# Patient Record
Sex: Male | Born: 1945
Health system: Southern US, Community
[De-identification: ages and names within clinical notes are randomized; demographics above are authoritative.]

## PROBLEM LIST (undated history)

## (undated) DIAGNOSIS — H269 Unspecified cataract: Secondary | ICD-10-CM

## (undated) DIAGNOSIS — H3530 Unspecified macular degeneration: Secondary | ICD-10-CM

## (undated) DIAGNOSIS — E785 Hyperlipidemia, unspecified: Secondary | ICD-10-CM

## (undated) DIAGNOSIS — I10 Essential (primary) hypertension: Secondary | ICD-10-CM

## (undated) DIAGNOSIS — U071 COVID-19: Secondary | ICD-10-CM

## (undated) DIAGNOSIS — N261 Atrophy of kidney (terminal): Secondary | ICD-10-CM

## (undated) DIAGNOSIS — B019 Varicella without complication: Secondary | ICD-10-CM

## (undated) DIAGNOSIS — IMO0002 Reserved for concepts with insufficient information to code with codable children: Secondary | ICD-10-CM

## (undated) DIAGNOSIS — Z87442 Personal history of urinary calculi: Secondary | ICD-10-CM

## (undated) DIAGNOSIS — Z950 Presence of cardiac pacemaker: Secondary | ICD-10-CM

## (undated) DIAGNOSIS — J439 Emphysema, unspecified: Secondary | ICD-10-CM

## (undated) DIAGNOSIS — K753 Granulomatous hepatitis, not elsewhere classified: Secondary | ICD-10-CM

## (undated) DIAGNOSIS — K219 Gastro-esophageal reflux disease without esophagitis: Secondary | ICD-10-CM

## (undated) DIAGNOSIS — M51369 Other intervertebral disc degeneration, lumbar region without mention of lumbar back pain or lower extremity pain: Secondary | ICD-10-CM

## (undated) DIAGNOSIS — M5136 Other intervertebral disc degeneration, lumbar region: Secondary | ICD-10-CM

## (undated) DIAGNOSIS — N200 Calculus of kidney: Secondary | ICD-10-CM

## (undated) DIAGNOSIS — Z8719 Personal history of other diseases of the digestive system: Secondary | ICD-10-CM

## (undated) DIAGNOSIS — K8020 Calculus of gallbladder without cholecystitis without obstruction: Secondary | ICD-10-CM

## (undated) DIAGNOSIS — K449 Diaphragmatic hernia without obstruction or gangrene: Secondary | ICD-10-CM

## (undated) DIAGNOSIS — Q600 Renal agenesis, unilateral: Secondary | ICD-10-CM

## (undated) DIAGNOSIS — I517 Cardiomegaly: Secondary | ICD-10-CM

## (undated) DIAGNOSIS — Z95810 Presence of automatic (implantable) cardiac defibrillator: Secondary | ICD-10-CM

## (undated) DIAGNOSIS — Z45018 Encounter for adjustment and management of other part of cardiac pacemaker: Secondary | ICD-10-CM

## (undated) HISTORY — PX: FOOT SURGERY: SHX648

## (undated) HISTORY — DX: Other intervertebral disc degeneration, lumbar region without mention of lumbar back pain or lower extremity pain: M51.369

## (undated) HISTORY — DX: Calculus of kidney: N20.0

## (undated) HISTORY — DX: Atrophy of kidney (terminal): N26.1

## (undated) HISTORY — DX: Other intervertebral disc degeneration, lumbar region: M51.36

## (undated) HISTORY — DX: Varicella without complication: B01.9

## (undated) HISTORY — DX: Unspecified cataract: H26.9

## (undated) HISTORY — DX: Hyperlipidemia, unspecified: E78.5

## (undated) HISTORY — DX: COVID-19: U07.1

## (undated) HISTORY — DX: Diaphragmatic hernia without obstruction or gangrene: K44.9

## (undated) HISTORY — DX: Cardiomegaly: I51.7

## (undated) HISTORY — DX: Essential (primary) hypertension: I10

## (undated) HISTORY — DX: Granulomatous hepatitis, not elsewhere classified: K75.3

## (undated) HISTORY — DX: Personal history of other diseases of the digestive system: Z87.19

## (undated) HISTORY — PX: PACEMAKER PLACEMENT: SHX43

## (undated) HISTORY — DX: Unspecified macular degeneration: H35.30

## (undated) HISTORY — DX: Emphysema, unspecified: J43.9

## (undated) HISTORY — DX: Calculus of gallbladder without cholecystitis without obstruction: K80.20

---
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
## 1999-11-01 ENCOUNTER — Encounter: Payer: Self-pay | Admitting: Gastroenterology

## 1999-11-01 ENCOUNTER — Encounter (INDEPENDENT_AMBULATORY_CARE_PROVIDER_SITE_OTHER): Payer: Self-pay

## 1999-11-01 ENCOUNTER — Ambulatory Visit (HOSPITAL_COMMUNITY): Admission: RE | Admit: 1999-11-01 | Discharge: 1999-11-01 | Payer: Self-pay | Admitting: Gastroenterology

## 2000-04-13 ENCOUNTER — Encounter: Admission: RE | Admit: 2000-04-13 | Discharge: 2000-04-13 | Payer: Self-pay | Admitting: Gastroenterology

## 2000-04-13 ENCOUNTER — Encounter: Payer: Self-pay | Admitting: Gastroenterology

## 2001-10-10 ENCOUNTER — Ambulatory Visit (HOSPITAL_COMMUNITY): Admission: RE | Admit: 2001-10-10 | Discharge: 2001-10-10 | Payer: Self-pay | Admitting: Orthopedic Surgery

## 2001-10-10 ENCOUNTER — Encounter (INDEPENDENT_AMBULATORY_CARE_PROVIDER_SITE_OTHER): Payer: Self-pay | Admitting: *Deleted

## 2007-01-28 ENCOUNTER — Emergency Department (HOSPITAL_COMMUNITY): Admission: EM | Admit: 2007-01-28 | Discharge: 2007-01-28 | Payer: Self-pay | Admitting: Emergency Medicine

## 2007-01-29 ENCOUNTER — Ambulatory Visit (HOSPITAL_BASED_OUTPATIENT_CLINIC_OR_DEPARTMENT_OTHER): Admission: RE | Admit: 2007-01-29 | Discharge: 2007-01-29 | Payer: Self-pay | Admitting: Urology

## 2009-05-08 ENCOUNTER — Encounter: Admission: RE | Admit: 2009-05-08 | Discharge: 2009-05-08 | Payer: Self-pay | Admitting: Family Medicine

## 2011-04-01 NOTE — Op Note (Signed)
[name], [name]                 ACCOUNT NO.:  [id number]   MEDICAL RECORD NO.:  [id number]          PATIENT TYPE:  AMB   LOCATION:  [facility]                         FACILITY:  [facility]   PHYSICIAN:  Maretta Bees. Vonita Moss, M.D.DATE OF BIRTH:  [birth year]   DATE OF PROCEDURE:  01/29/2007  DATE OF DISCHARGE:                               OPERATIVE REPORT   PREOPERATIVE DIAGNOSES:  Right ureteral stone with hydronephrosis, acute  renal insufficiency, atrophic left kidney with duplicated system and  hydronephrotic lower pole.   POSTOPERATIVE DIAGNOSES:  Right ureteral stone with hydronephrosis,  acute renal insufficiency, atrophic left kidney with duplicated system  and hydronephrotic lower pole.   PROCEDURE:  Cystoscopy, bilateral retrograde pyelograms with  interpretation, right ureteroscopy, holmium laser of stone and insertion  of right double-J catheter.   INDICATIONS:  This gentleman has had eight or nine days of right flank  pain and nausea, and was seen by me today on an emergent basis.  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  He had compensatory  hypertrophy of the right kidney with hydronephrosis down to a 4 mm stone  at the iliac crest.  The creatinine today was 4.4.   I had a detailed discussion about management of his problem with the  patient, and actually the discussion was really before the creatinine of  4.4 was resulted.  I told him that I thought with his solitary good  kidney he should undergo a cystoscopy and ureteroscopy but not  aggressively so, and we would try and get the stone out today, but he  would probably need a double-J catheter, and certainly with the  creatinine of 4.4, that became more apparent   DESCRIPTION OF PROCEDURE:  The  patient was brought to the operating room  and placed in the lithotomy position.  The external genitalia were  prepped and draped in the usual fashion.  He was cystoscoped, and the  anterior and  prostatic urethra were unremarkable.  The bladder was  unremarkable.   Retrograde pyelograms were performed by placement of ureteral catheters  through the cystoscope, and initially an open-ended ureteral catheter  was used to perform a left retrograde pyelogram that showed a dilated  lower pole collecting system and a delicate upper pole collecting  system.  No filling defects were noted.  In the course of performing his  right ureteroscopy, contrast showed mild pyelocaliectasis, and at the  end of the case a good placement of the proximal end of his double-J  catheter.   The guidewire was placed up the right ureter without particular  difficulty.  It was really hard to visualize the stone for sure.  It  might have been over the bony iliac crest.  With the guidewire in place,  the ureteral orifice looked a little too snug to insert a ureteroscope,  so I  used the internal sheath of the ureteral access sheath to dilate  the intra-mural ureter.  After that, I negotiated the 6-French rigid  ureteroscope, up to the level of the iliac crest where the ureter was a  little bit narrowed or in spasm.  I then inserted the internal sheath of  the access sheath up to this level, and after that the ureteroscope did  go beyond this area and I encountered his stone.  I was able to grasp  the stone, in a Nitinol stone basket but it would not come down through  this tight area and I did not want to force the issue in an  essentially  solitary good kidney.  I released the stone and then inserted the  ureteroscope again and using the laser, I hit it with a couple of  shocks, but it wound rebound back up the ureter.  I was able to retrieve  it again with the basket and again had to release it, because it would   not come down beyond the ureter at the level of the iliac vessels,  I  used the laser again, but again  I could not immobilize the stone.  I  did not a risk,  keeping the stone and a basket and cutting the basket  handle off and then having foreign bodies in his solitary good renal  unit.  I concluded that it was best just to put up a double-J at this  point and reassess his situation in a couple days and repeat a BUN and  creatinine and repeat some x-rays and possibly a repeat ureteroscopy,  which would allow [place] to retrieve the stone more readily.  It  may be  that a multi-thin flexible ureteroscope and further ureteral dilation  might be necessary to retrieve this stone, if that is what we decide to  do.  In any case, I did insert a 6-French, 28 cm double-J catheter.  This coiled nicely in the renal pelvis and a full coil in the bladder.  The string was removed.  The bladder was emptied.  The cystoscope was  removed.   The patient was sent to he recovery room in good condition, having  tolerated the procedure well.      Maretta Bees. Vonita Moss, M.D.  Electronically Signed     LJP/MEDQ  D:  01/29/2007  T:  01/30/2007  Job:  [id number]

## 2011-04-01 NOTE — Op Note (Signed)
[facility]  Patient:    [name], [name] Visit Number: [id number] MRN: [id number]          Service Type: DSU Location: DAY Attending Physician:  Marlowe Kays Page Dictated by:   Illene Labrador. Aplington, M.D. Proc. 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  OPERATION PERFORMED:  Excision of Mortons neuroma third and fourth web space, right foot.  SURGEON:  Illene Labrador. Aplington, M.D.  ASSISTANT:  Nurse.  ANESTHESIA:  General.  PATHOLOGY AND JUSTIFICATION FOR PROCEDURE:  He had typical signs and symptoms of Mortons neuroma. He has a slight tenderness in the second and third web space but most of his pain is reproduced in the third and fourth. I discussed with him that due to risk of possibly devascularizing the third toe, it was best not to operate on both at the same time and that he might not need the second and third web space surgery. He had had only temporary relief from steroid and xylocaine injection.  DESCRIPTION OF PROCEDURE:  Satisfactory general anesthesia, pneumatic tourniquet, Duraprep, the foot and ankle was draped in a sterile field. Through a web splitting incision with blunt dissection, I isolated the very scarred common digital nerve which I grasped with Allis clamp. I then dissected out the two branches to the third and fourth toes which I cut distally with the cutting cautery. I also freed up a good bit of the scar from around the common digital nerve with both the Bovie and sharp dissection with scissors. The stalk, I then sectioned as far between the intermetatarsal head area as I could with the cutting cautery. I then released the tourniquet. One bleeder was found and coagulated. The wound was dry thereafter. I  irrigated the wound with sterile saline, placed some Gelfoam between the metatarsal heads at the base of the surgical excision area. I placed a single 3-0 Vicryl subcutaneous tissue which brought the wound edges together nicely. I then closed the skin and subcutaneous tissue with a combination of simple and interrupted 4-0 nylon. Betadine Adaptic dry sterile dressing were then applied. The patient tolerated the procedure well and at the time of this dictation was on his way to the recovery room in satisfactory condition with no known complications. Dictated by:   Illene Labrador. Aplington, M.D. Attending Physician:  Joaquin Courts DD:  10/10/01 TD:  10/10/01 Job: [id number]

## 2013-01-28 ENCOUNTER — Other Ambulatory Visit: Payer: Self-pay | Admitting: Family Medicine

## 2013-01-28 DIAGNOSIS — Z131 Encounter for screening for diabetes mellitus: Secondary | ICD-10-CM | POA: Diagnosis not present

## 2013-01-28 DIAGNOSIS — Z136 Encounter for screening for cardiovascular disorders: Secondary | ICD-10-CM

## 2013-01-28 DIAGNOSIS — Z23 Encounter for immunization: Secondary | ICD-10-CM | POA: Diagnosis not present

## 2013-01-28 DIAGNOSIS — R030 Elevated blood-pressure reading, without diagnosis of hypertension: Secondary | ICD-10-CM | POA: Diagnosis not present

## 2013-01-28 DIAGNOSIS — Z0000 Encounter for general adult medical examination without abnormal findings: Secondary | ICD-10-CM | POA: Diagnosis not present

## 2013-01-28 DIAGNOSIS — E7800 Pure hypercholesterolemia, unspecified: Secondary | ICD-10-CM | POA: Diagnosis not present

## 2013-02-04 ENCOUNTER — Ambulatory Visit
Admission: RE | Admit: 2013-02-04 | Discharge: 2013-02-04 | Disposition: A | Discharge status: Home / Self Care | Payer: Medicare Other | Source: Ambulatory Visit | Attending: Family Medicine | Admitting: Family Medicine

## 2013-02-04 DIAGNOSIS — Z136 Encounter for screening for cardiovascular disorders: Secondary | ICD-10-CM

## 2014-02-04 ENCOUNTER — Telehealth: Payer: Self-pay | Admitting: *Deleted

## 2014-02-04 ENCOUNTER — Other Ambulatory Visit: Payer: Self-pay | Admitting: Internal Medicine

## 2014-02-04 DIAGNOSIS — I442 Atrioventricular block, complete: Secondary | ICD-10-CM

## 2014-02-04 DIAGNOSIS — Z136 Encounter for screening for cardiovascular disorders: Secondary | ICD-10-CM | POA: Diagnosis not present

## 2014-02-04 DIAGNOSIS — Q605 Renal hypoplasia, unspecified: Secondary | ICD-10-CM | POA: Diagnosis not present

## 2014-02-04 DIAGNOSIS — Z1211 Encounter for screening for malignant neoplasm of colon: Secondary | ICD-10-CM | POA: Diagnosis not present

## 2014-02-04 DIAGNOSIS — I498 Other specified cardiac arrhythmias: Secondary | ICD-10-CM | POA: Diagnosis not present

## 2014-02-04 DIAGNOSIS — I10 Essential (primary) hypertension: Secondary | ICD-10-CM | POA: Diagnosis not present

## 2014-02-04 DIAGNOSIS — Z0000 Encounter for general adult medical examination without abnormal findings: Secondary | ICD-10-CM | POA: Diagnosis not present

## 2014-02-04 DIAGNOSIS — Q602 Renal agenesis, unspecified: Secondary | ICD-10-CM | POA: Diagnosis not present

## 2014-02-04 NOTE — Telephone Encounter (Signed)
Pt referred for evaluation of PPM implantation due to heart block found today at office visit with Dr Einar Gip.  Dr Rayann Heman reviewed EKG and recommended evaluation in short stay tomorrow with pacemaker implantation same day.  Spoke with patient and advised to come to short stay at [facility] for EP consult.  Echocardiogram and pre-pacemaker orders written.  Pt advised if any symptoms of dizziness, chest pain, or pre-syncope he should come to the ER for evaluation.  Also advised he should not drive.  Pt aware and agrees with plan.

## 2014-02-05 ENCOUNTER — Encounter (HOSPITAL_COMMUNITY): Payer: Self-pay | Admitting: Cardiology

## 2014-02-05 ENCOUNTER — Inpatient Hospital Stay (HOSPITAL_COMMUNITY)
Admission: RE | Admit: 2014-02-05 | Discharge: 2014-02-07 | DRG: 243 | Disposition: A | Discharge status: Home / Self Care | Payer: Medicare Other | Source: Ambulatory Visit | Attending: Internal Medicine | Admitting: Internal Medicine

## 2014-02-05 ENCOUNTER — Encounter (HOSPITAL_COMMUNITY)
Admission: RE | Disposition: A | Discharge status: Home / Self Care | Payer: Self-pay | Source: Ambulatory Visit | Attending: Internal Medicine

## 2014-02-05 DIAGNOSIS — Q605 Renal hypoplasia, unspecified: Secondary | ICD-10-CM

## 2014-02-05 DIAGNOSIS — I442 Atrioventricular block, complete: Secondary | ICD-10-CM | POA: Diagnosis not present

## 2014-02-05 DIAGNOSIS — I369 Nonrheumatic tricuspid valve disorder, unspecified: Secondary | ICD-10-CM | POA: Diagnosis not present

## 2014-02-05 DIAGNOSIS — I498 Other specified cardiac arrhythmias: Secondary | ICD-10-CM | POA: Diagnosis not present

## 2014-02-05 DIAGNOSIS — Q600 Renal agenesis, unilateral: Secondary | ICD-10-CM

## 2014-02-05 DIAGNOSIS — E785 Hyperlipidemia, unspecified: Secondary | ICD-10-CM | POA: Diagnosis not present

## 2014-02-05 DIAGNOSIS — K219 Gastro-esophageal reflux disease without esophagitis: Secondary | ICD-10-CM | POA: Diagnosis present

## 2014-02-05 DIAGNOSIS — J9819 Other pulmonary collapse: Secondary | ICD-10-CM | POA: Diagnosis not present

## 2014-02-05 DIAGNOSIS — Q602 Renal agenesis, unspecified: Secondary | ICD-10-CM

## 2014-02-05 DIAGNOSIS — IMO0002 Reserved for concepts with insufficient information to code with codable children: Secondary | ICD-10-CM

## 2014-02-05 HISTORY — DX: Hyperlipidemia, unspecified: E78.5

## 2014-02-05 HISTORY — DX: Gastro-esophageal reflux disease without esophagitis: K21.9

## 2014-02-05 HISTORY — DX: Reserved for concepts with insufficient information to code with codable children: IMO0002

## 2014-02-05 HISTORY — DX: Renal agenesis, unilateral: Q60.0

## 2014-02-05 HISTORY — PX: PERMANENT PACEMAKER INSERTION: SHX5480

## 2014-02-05 LAB — BASIC METABOLIC PANEL
BUN: 14 mg/dL (ref 6–23)
CHLORIDE: 105 meq/L (ref 96–112)
CO2: 23 mEq/L (ref 19–32)
CREATININE: 0.95 mg/dL (ref 0.50–1.35)
Calcium: 9.2 mg/dL (ref 8.4–10.5)
GFR calc Af Amer: >90 mL/min (ref >90)
GFR calc non Af Amer: 85 mL/min — ABNORMAL LOW (ref >90)
GLUCOSE: 98 mg/dL (ref 70–99)
Potassium: 4.4 mEq/L (ref 3.7–5.3)
Sodium: 142 mEq/L (ref 137–147)

## 2014-02-05 LAB — CBC
HEMATOCRIT: 42.9 % (ref 39.0–52.0)
Hemoglobin: 14.4 g/dL (ref 13.0–17.0)
MCH: 27.9 pg (ref 26.0–34.0)
MCHC: 33.6 g/dL (ref 30.0–36.0)
MCV: 83 fL (ref 78.0–100.0)
Platelets: 162 10*3/uL (ref 150–400)
RBC: 5.17 MIL/uL (ref 4.22–5.81)
RDW: 14.5 % (ref 11.5–15.5)
WBC: 4.8 10*3/uL (ref 4.0–10.5)

## 2014-02-05 LAB — TSH: TSH: 0.819 u[IU]/mL (ref 0.350–4.500)

## 2014-02-05 LAB — SURGICAL PCR SCREEN
MRSA, PCR: NEGATIVE
Staphylococcus aureus: POSITIVE — AB

## 2014-02-05 LAB — PROTIME-INR
INR: 1.04 (ref 0.00–1.49)
Prothrombin Time: 13.4 seconds (ref 11.6–15.2)

## 2014-02-05 SURGERY — PERMANENT PACEMAKER INSERTION
Anesthesia: LOCAL

## 2014-02-05 MED ORDER — SODIUM CHLORIDE 0.9 % IV SOLN: INTRAVENOUS | Status: AC

## 2014-02-05 MED ORDER — CEFAZOLIN SODIUM-DEXTROSE 2-3 GM-% IV SOLR
2.0000 g | INTRAVENOUS | Status: DC
  Filled 2014-02-05: qty 50

## 2014-02-05 MED ORDER — ONDANSETRON HCL 4 MG/2ML IJ SOLN: 4.0000 mg | Freq: Four times a day (QID) | INTRAMUSCULAR | Status: DC | PRN

## 2014-02-05 MED ORDER — PERFLUTREN LIPID MICROSPHERE
1.0000 mL | INTRAVENOUS | Status: AC | PRN
  Administered 2014-02-05: 2 mL via INTRAVENOUS
  Filled 2014-02-05: qty 10

## 2014-02-05 MED ORDER — GENTAMICIN SULFATE 40 MG/ML IJ SOLN
80.0000 mg | INTRAMUSCULAR | Status: DC
  Filled 2014-02-05: qty 2

## 2014-02-05 MED ORDER — CHLORHEXIDINE GLUCONATE 4 % EX LIQD: 60.0000 mL | Freq: Once | CUTANEOUS | Status: DC

## 2014-02-05 MED ORDER — LIDOCAINE HCL (PF) 1 % IJ SOLN
INTRAMUSCULAR | Status: AC
  Filled 2014-02-05: qty 60

## 2014-02-05 MED ORDER — MUPIROCIN 2 % EX OINT
TOPICAL_OINTMENT | Freq: Two times a day (BID) | CUTANEOUS | Status: DC
  Administered 2014-02-05 (×2): 1 via NASAL
  Administered 2014-02-06 (×2): via NASAL

## 2014-02-05 MED ORDER — FENTANYL CITRATE 0.05 MG/ML IJ SOLN
INTRAMUSCULAR | Status: AC
  Filled 2014-02-05: qty 2

## 2014-02-05 MED ORDER — HEPARIN (PORCINE) IN NACL 2-0.9 UNIT/ML-% IJ SOLN
INTRAMUSCULAR | Status: AC
  Filled 2014-02-05: qty 500

## 2014-02-05 MED ORDER — CEFAZOLIN SODIUM 1-5 GM-% IV SOLN
1.0000 g | Freq: Four times a day (QID) | INTRAVENOUS | Status: AC
  Administered 2014-02-05 – 2014-02-06 (×3): 1 g via INTRAVENOUS
  Filled 2014-02-05 (×3): qty 50

## 2014-02-05 MED ORDER — ACETAMINOPHEN 325 MG PO TABS: 325.0000 mg | ORAL_TABLET | ORAL | Status: DC | PRN

## 2014-02-05 MED ORDER — SODIUM CHLORIDE 0.9 % IV SOLN
INTRAVENOUS | Status: DC
  Administered 2014-02-05: 11:00:00 via INTRAVENOUS

## 2014-02-05 MED ORDER — MIDAZOLAM HCL 5 MG/5ML IJ SOLN
INTRAMUSCULAR | Status: AC
  Filled 2014-02-05: qty 5

## 2014-02-05 MED ORDER — MUPIROCIN 2 % EX OINT
TOPICAL_OINTMENT | CUTANEOUS | Status: AC
  Administered 2014-02-05: 1
  Filled 2014-02-05: qty 22

## 2014-02-05 MED ORDER — SODIUM CHLORIDE 0.9 % IV SOLN
INTRAVENOUS | Status: DC
  Administered 2014-02-05: 20:00:00 via INTRAVENOUS

## 2014-02-05 NOTE — H&P (Signed)
ELECTROPHYSIOLOGY ADMISSION HISTORY & PHYSICAL   Patient ID: [name] MRN: [id number], DOB/AGE: 1946-02-14   Date of Admission: 02/05/2014  Primary Physician: Maxwell Marion, MD Primary Cardiologist: Einar Gip, MD Reason for Admission: Complete heart block  History of Present Illness [name] is a pleasant 68 year old man with GERD, dyslipidemia and solitary kidney who presents with complete heart block. He reports "not feeling like myself" for about 6 months and states he has noticed increasing fatigue / exercise intolerance and DOE. He denies CP, palpitations, dizziness, near syncope or syncope. He denies LE swelling, orthopnea or PND. He denies history of CAD/MI, valvular heart disease or CHF. He takes no medications. He was evaluated by his PCP yesterday for routine physical and was bradycardic. An ECG was done revealing complete heart block and he was referred urgently to Dr. Einar Gip who then referred him for EP evaluation today. Of note, [name] denies history of rheumatologic disorder nor does he have history of sarcoidosis or amyloidosis. He has history of tick bite (unknown species) but greater than one year ago and no symptoms / complications following the bite.   Past Medical History Past Medical History  Diagnosis Date  . Solitary kidney   . GERD (gastroesophageal reflux disease)   . Dyslipidemia     Past Surgical History None   Allergies/Intolerances No known allergies  Home Medications None  Family History Negative for CAD or SCD   Social History History   Social History  . Marital Status: Single    Spouse Name: N/A    Number of Children: N/A  . Years of Education: N/A   Occupational History  . Not on file.   Social History Main Topics  . Smoking status: Never Smoker   . Smokeless tobacco: Not on file  . Alcohol Use: 0.0 oz/week     Comment: Drinks 2 ounces of liquor daily  . Drug Use: No  . Sexual Activity: Not on file   Other Topics Concern  . Not on  file   Social History Narrative  . No narrative on file     Review of Systems General: No chills, fever, night sweats or weight changes.  Cardiovascular: No chest pain, dyspnea on exertion, edema, orthopnea, palpitations, paroxysmal nocturnal dyspnea. Dermatological: No rash, lesions or masses. Respiratory: No cough, dyspnea. Urologic: No hematuria, dysuria. Abdominal: No nausea, vomiting, diarrhea, bright red blood per rectum, melena, or hematemesis. Neurologic: No visual changes, weakness, changes in mental status. All other systems reviewed and are otherwise negative except as noted above.  Physical Exam Vitals: Blood pressure 151/82, pulse 36, temperature 98.1 F (36.7 C), temperature source Oral, resp. rate 18, height 6\' 1"  (1.854 m), weight 220 lb (99.791 kg), SpO2 100.00%.  General: Well developed, well appearing 68 y.o. male in no acute distress. HEENT: Normocephalic, atraumatic. EOMs intact. Sclera nonicteric. Oropharynx clear.  Neck: Supple without bruits. No JVD. Lungs: Respirations regular and unlabored, CTA bilaterally. No wheezes, rales or rhonchi. Heart: Bradycardic. S1, S2 present. No murmurs, rub, S3 or S4. Abdomen: Soft, non-tender, non-distended. BS present x 4 quadrants. No hepatosplenomegaly.  Extremities: No clubbing, cyanosis or edema. DP/PT/Radials 2+ and equal bilaterally. Psych: Normal affect. Neuro: Alert and oriented X 3. Moves all extremities spontaneously. Musculoskeletal: No kyphosis. Skin: Intact. Warm and dry. No rashes or petechiae in exposed areas.   Labs CBC    Component Value Date/Time   WBC 4.8 02/05/2014 0900   RBC 5.17 02/05/2014 0900   HGB 14.4 02/05/2014 0900  HCT 42.9 02/05/2014 0900   PLT 162 02/05/2014 0900   MCV 83.0 02/05/2014 0900   MCH 27.9 02/05/2014 0900   MCHC 33.6 02/05/2014 0900   RDW 14.5 02/05/2014 0900   BMET    Component Value Date/Time   NA 142 02/05/2014 0900   K 4.4 02/05/2014 0900   CL 105 02/05/2014 0900   CO2 23  02/05/2014 0900   GLUCOSE 98 02/05/2014 0900   BUN 14 02/05/2014 0900   CREATININE 0.95 02/05/2014 0900   CALCIUM 9.2 02/05/2014 0900   GFRNONAA 85* 02/05/2014 0900   GFRAA >90 02/05/2014 0900   TSH pending  Radiology/Studies No results found.  Echocardiogram today  Study Conclusions - Left ventricle: The cavity size was normal. Wall thickness was increased in a pattern of mild LVH. Systolic function was normal. The estimated ejection fraction was in the range of 55% to 60%. Wall motion was normal; there were no regional wall motion abnormalities. Left ventricular diastolic function parameters were normal. - Left atrium: The atrium was at the upper limits of normal in size. - Tricuspid valve: Mild regurgitation. - Systemic veins: IVC is not well visualized. - Pericardium, extracardiac: There was no pericardial effusion.  12-lead ECG - complete heart block with ventricular escape at 36 bpm  There are no prior 12-lead ECGs available for comparison  Assessment and Plan Complete heart block Normal LVEF [name] presents with symptomatic complete heart block. There are no reversible causes identified. He meets criteria for PPM implantation. Risks, benefits and alternatives to PPM implantation were discussed in detail with him and his family today. These risks include, but are not limited to, bleeding, infection, pneumothorax, perforation, tamponade, vascular damage, lead dislodgement, renal failure, MI, stroke and death. [name] expressed verbal understanding and agrees to proceed.   Signed, Ileene Hutchinson, PA-C 02/05/2014, 11:09 AM  AS above  Pt with progressive DOE  Without LH  And noted yesterday to have CHB  With underlying normal LV function  PE as noted  The benefits and risks were reviewed including but not limited to death,  perforation, infection, lead dislodgement and device malfunction.  The patient understands agrees and is willing to proceed.

## 2014-02-05 NOTE — Progress Notes (Deleted)
Report called to Olivia, RN on 2W. 

## 2014-02-05 NOTE — Progress Notes (Signed)
Echocardiogram 2D Echocardiogram with Definity has been performed.  [name] 02/05/2014, 10:41 AM

## 2014-02-05 NOTE — Progress Notes (Signed)
Utilization Review Completed.[name] T3/25/2015  

## 2014-02-05 NOTE — CV Procedure (Signed)
Preop DX:: Post op DX:: same  Procedure  dual pacemaker implantation  After routine prep and drape, lidocaine was infiltrated in the prepectoral subclavicular region on the left side an incision was made and carried down to later the prepectoral fascia using electrocautery and sharp dissection a pocket was formed similarly. Hemostasis was obtained.  After this, we turned our attention to gaining accessm to the extrathoracic,left subclavian vein. This was accomplished without difficulty and without the aspiration of air or puncture of the artery. 2 separate venipunctures were accomplished; guidewires were placed and retained and sequentially 7 French sheath through which were  passed [facility] Jude  ventricular lead serial number [id number] and an Medtronic  atrial lead serial number [id number] .  The ventricular lead was manipulated to the right ventricular apex with a bipolar R wave was nonE, the pacing impedance was 640, the threshold was 1.0 @ 0.4 msec  Current at threshold was   1.4  Ma and the current of injury was  BRISK.  The right atrial lead was manipulated to the right atrial appendage with a bipolar P-wave  3.5, the pacing impedance was 630, the threshold 0.75@ 0.5 msec   Current at threshold was 0.9  Ma and the current of injury was BRISK.  The ventricular lead was marked with a tie prior to the insertion of the atrial lead. The leads were affixed to the prepectoral fascia and attached to a  St Jude  pulse generator serial number [id number].  Hemostasis was obtained. The pocket was copiously irrigated with antibiotic containing saline solution. The leads and the pulse generator were placed in the pocket and affixed to the prepectoral fascia. The wound was then closed in 2 layers in the normal fashion.  The wound was washed dried . And a dermabond adhesive was applied   Needle  Count, sponge counts and instrument counts were correct at the end of the procedure .   The patient tolerated the  procedure without apparent complication.  [name].D.

## 2014-02-06 ENCOUNTER — Inpatient Hospital Stay (HOSPITAL_COMMUNITY): Payer: Medicare Other

## 2014-02-06 ENCOUNTER — Encounter (HOSPITAL_COMMUNITY): Payer: Self-pay | Admitting: Neurology

## 2014-02-06 DIAGNOSIS — E785 Hyperlipidemia, unspecified: Secondary | ICD-10-CM | POA: Diagnosis not present

## 2014-02-06 DIAGNOSIS — I498 Other specified cardiac arrhythmias: Secondary | ICD-10-CM | POA: Diagnosis not present

## 2014-02-06 DIAGNOSIS — J9819 Other pulmonary collapse: Secondary | ICD-10-CM | POA: Diagnosis not present

## 2014-02-06 DIAGNOSIS — Q602 Renal agenesis, unspecified: Secondary | ICD-10-CM | POA: Diagnosis not present

## 2014-02-06 DIAGNOSIS — I442 Atrioventricular block, complete: Secondary | ICD-10-CM | POA: Diagnosis not present

## 2014-02-06 LAB — ANGIOTENSIN CONVERTING ENZYME: ANGIOTENSIN-CONVERTING ENZYME: 38 U/L (ref 8–52)

## 2014-02-06 LAB — B. BURGDORFI ANTIBODIES: B burgdorferi Ab IgG+IgM: 0.56 {ISR}

## 2014-02-06 MED ORDER — IOHEXOL 300 MG/ML  SOLN
80.0000 mL | Freq: Once | INTRAMUSCULAR | Status: AC | PRN
  Administered 2014-02-06: 80 mL via INTRAVENOUS

## 2014-02-06 NOTE — Discharge Summary (Signed)
ELECTROPHYSIOLOGY DISCHARGE SUMMARY   Patient ID: [name],  MRN: [id number], DOB/AGE: 67-Nov-1947 67 y.o.  Admit date: 02/05/2014 Discharge date: 02/07/2014  Primary Care Physician: Jose Marion, MD Primary Cardiologist: Jose Gip, MD Primary EP: Jose Comes, MD  Primary Discharge Diagnosis:  1. Complete heart block s/p PPM implantation  Secondary Discharge Diagnoses:  1. Solitary kidney 2. Dyslipidemia 3. GERD  Procedures This Admission:   1. 2D echocardiogram 02/05/2013 Study Conclusions - Left ventricle: The cavity size was normal. Wall thickness was increased in a pattern of mild LVH. Systolic function was normal. The estimated ejection fraction was in the range of 55% to 60%. Wall motion was normal; there were no regional wall motion abnormalities. Left ventricular diastolic function parameters were normal. - Left atrium: The atrium was at the upper limits of normal in size. - Tricuspid valve: Mild regurgitation. - Systemic veins: IVC is not well visualized. - Pericardium, extracardiac: There was no pericardial Effusion.  2. Dual chamber PPM implantation 02/05/2014 RA lead - Medtronic atrial lead serial number [id number]  RV lead - St Jude ventricular lead serial number [id number] Device -St Jude pulse generator serial number [id number]  3. Chest CT 02/06/2014 FINDINGS: Left subclavian transvenous pacemaker leads within right atrium and right ventricle. Atherosclerotic calcifications aorta and coronary arteries. Vascular structures grossly patent on nondedicated exam. Calcified gallstones within contracted gallbladder. Calcified granuloma within liver. Nonspecific low-attenuation foci within liver, 7-10 mm diameter. Atrophy of upper pole left kidney. Remaining visualized portion of upper abdomen unremarkable. No mediastinal, hilar or axillary adenopathy. Mild dependent atelectasis in both lungs. No acute infiltrate, pleural effusion or pneumothorax. Osseous structures  unremarkable. IMPRESSION: Cholelithiasis. Minimal dependent atelectasis in both lungs. No other intra thoracic abnormalities. Specifically no evidence of hilar adenopathy. Nonspecific low-attenuation foci within liver 7-10 mm diameter. These are of uncertain etiology. Follow-up CT imaging recommended in 3 months to ensure stability.  History: [name] is a pleasant 68 year old man with GERD, dyslipidemia and solitary kidney who presented 02/05/2013 with symptomatic complete heart block. He reported "not feeling like myself" for about 6 months and has noticed increasing fatigue / exercise intolerance and DOE. He denied CP, palpitations, dizziness, near syncope or syncope. He denied LE swelling, orthopnea or PND. He has no history of CAD/MI, valvular heart disease or CHF. He takes no medications. He was evaluated by his PCP on the day prior to admission for routine physical and was bradycardic. An ECG was done revealing complete heart block and he was referred urgently to [name] who then referred him for admission to [name] and EP evaluation. Of note, [name] has no history of rheumatologic disorder nor does he have history of sarcoidosis or amyloidosis. He has history of tick bite (unknown species) but greater than one year ago and no symptoms / complications following the bite.   Hospital Course:  [name] was admitted on 02/05/2014 and an echo was done revealing normal LVEF and no significant valvular abnormalities. TSH was within normal range. Lyme titer was negative. There were no reversible causes identified. Therefore, he underwent dual chamber PPM implantation on 67/25/2014. [name] tolerated this procedure well without any immediate complication. He was observed for 48 hours post implant. He remains hemodynamically stable and afebrile. His chest xray shows stable lead placement without pneumothorax. Chest CT was done to rule out hilar adenopathy. There was no hilar adenopathy seen on chest  CT; however, there was a 7-10 mm low attenuation foci seen in the liver. Follow-up CT imaging  was recommended in 3 months to ensure stability. His device interrogation shows normal PPM function with stable lead parameters/measurements. His implant site is intact without significant bleeding or hematoma. He has been given discharge instructions including wound care and activity restrictions. He will follow-up in 10 days for wound check. He has been seen, examined and deemed stable for discharge today by [name].   Discharge Vitals: Blood pressure 136/89, pulse 67, temperature 98.3 F (36.8 C), temperature source Oral, resp. rate 18, height 6\' 1"  (1.854 m), weight 219 lb 4.3 oz (99.459 kg), SpO2 99.00%.   Labs: Lab Results  Component Value Date   WBC 4.8 02/05/2014   HGB 14.4 02/05/2014   HCT 42.9 02/05/2014   MCV 83.0 02/05/2014   PLT 162 02/05/2014     Recent Labs Lab 02/05/14 0900  NA 142  K 4.4  CL 105  CO2 23  BUN 14  CREATININE 0.95  CALCIUM 9.2  GLUCOSE 98   TSH 0.819 Lyme titer negative 0.56  Recent Labs  02/05/14 0900  INR 1.04    Disposition:  The patient is being discharged in stable condition.  Follow-up:     Follow-up Information   Follow up with [facility] On 02/13/2014. (At 4:00 PM for wound check)    Specialty:  Cardiology   Contact information:   [address], [place] [address] [phone]      Follow up with Virl Axe, MD On 05/20/2014. (At 1:45 PM)    Specialty:  Cardiology   Contact information:   [address] [place] [address] [phone]      Discharge Medications:    Medication List         omeprazole 40 MG capsule  Commonly known as:  PRILOSEC  Take 40 mg by mouth daily.       Duration of Discharge Encounter: Greater than 30 minutes including physician time.  Signed, Jose Hodgkins, [name] 02/07/2014, 10:06 AM

## 2014-02-06 NOTE — Discharge Instructions (Addendum)
° °  Supplemental Discharge Instructions for  Pacemaker/Defibrillator Patients  Activity No heavy lifting or vigorous activity with your left/right arm for 6 to 8 weeks.  Do not raise your left/right arm above your head for one week.  Gradually raise your affected arm as drawn below.           03/29                      03/30                       03/31                      04/01       NO DRIVING for 1 week; you may begin driving on 34/28/7681, after your wound check appointment. WOUND CARE   Keep the wound area clean and dry.  You may shower but no soaking in tub bath, swimming pool or hot tub for 7-10 days until wound completely healed.    The Dermabond (glue) on your wound will fall off on its own; do not pull it off.  No bandage is needed on the site.  DO  NOT apply any creams, oils, or ointments to the wound area.   If you notice any drainage or discharge from the wound, any swelling or bruising at the site, or you develop a fever > 101? F after you are discharged home, call the office at once.  Special Instructions   You are still able to use cellular telephones; use the ear opposite the side where you have your pacemaker/defibrillator.  Avoid carrying your cellular phone near your device.   When traveling through airports, show security personnel your identification card to avoid being screened in the metal detectors.  Ask the security personnel to use the hand wand.   Avoid arc welding equipment, MRI testing (magnetic resonance imaging), TENS units (transcutaneous nerve stimulators).  Call the office for questions about other devices.   Avoid electrical appliances that are in poor condition or are not properly grounded.   Microwave ovens are safe to be near or to operate.

## 2014-02-06 NOTE — Progress Notes (Signed)
    Patient: [name] Date of Encounter: 02/06/2014, 7:35 AM Admit date: 02/05/2014     Subjective  Mr. Hing is doing well this AM. He has no complaints. He is eager to go home.   Objective  Physical Exam: Vitals: BP 132/108  Pulse 60  Temp(Src) 98.3 F (36.8 C) (Oral)  Resp 16  Ht 6\' 1"  (1.854 m)  Wt 220 lb (99.791 kg)  BMI 29.03 kg/m2  SpO2 95% General: Well developed, well appearing 68 year old male in no acute distress. Neck: Supple. JVD not elevated. Lungs: Clear bilaterally to auscultation without wheezes, rales, or rhonchi. Breathing is unlabored. Heart: RRR S1 S2 without murmurs, rubs, or gallops.  Abdomen: Soft, non-distended. Extremities: No clubbing or cyanosis. No edema.  Distal pedal pulses are 2+ and equal bilaterally. Neuro: Alert and oriented X 3. Moves all extremities spontaneously. No focal deficits. Skin: Left upper chest / implant site intact without bleeding or hematoma. Minimal ecchymosis.  Intake/Output:  Intake/Output Summary (Last 24 hours) at 02/06/14 0735 Last data filed at 02/06/14 0600  Gross per 24 hour  Intake 871.67 ml  Output   1301 ml  Net -429.33 ml    Inpatient Medications:  . mupirocin ointment   Nasal BID   . sodium chloride Stopped (02/05/14 2154)    Labs:  Recent Labs  02/05/14 0900  NA 142  K 4.4  CL 105  CO2 23  GLUCOSE 98  BUN 14  CREATININE 0.95  CALCIUM 9.2    Recent Labs  02/05/14 0900  WBC 4.8  HGB 14.4  HCT 42.9  MCV 83.0  PLT 162    Recent Labs  02/05/14 1407  TSH 0.819    Recent Labs  02/05/14 0900  INR 1.04    Radiology/Studies: CXR this AM shows stable lead placement; official report pending Telemetry: AV paced Device interrogation: performed by industry - normal PPM function    Assessment and Plan  Complete heart block s/p dual chamber PPM implantation yesterday Mr. Sedivy is doing well post PPM implant. Device interrogation shows normal PPM function. Wound is intact without  bleeding or hematoma. Chest x-ray shows stable lead placement. Reviewed DC instructions, wound care and follow-up.   Signed, EDMISTEN, BROOKE PA-C  Transfer to tele  Home in am Will need to follow blood pressure Check ACE level CT chest am r/o hilar adenopathy

## 2014-02-07 DIAGNOSIS — I2510 Atherosclerotic heart disease of native coronary artery without angina pectoris: Secondary | ICD-10-CM | POA: Insufficient documentation

## 2014-02-07 DIAGNOSIS — I2584 Coronary atherosclerosis due to calcified coronary lesion: Secondary | ICD-10-CM | POA: Insufficient documentation

## 2014-02-07 NOTE — Progress Notes (Signed)
    Patient: [name] Date of Encounter: 02/07/2014, 8:34 AM Admit date: 02/05/2014     Subjective  [name] is doing well this AM. He has no complaints. He is eager to go home.   Objective  Physical Exam: Vitals: BP 136/89  Pulse 67  Temp(Src) 98.3 F (36.8 C) (Oral)  Resp 18  Ht 6\' 1"  (1.854 m)  Wt 219 lb 4.3 oz (99.459 kg)  BMI 28.94 kg/m2  SpO2 99% General: Well developed, well appearing 68 year old male in no acute distress. Neck: Supple. JVD not elevated. Lungs: Clear bilaterally to auscultation without wheezes, rales, or rhonchi. Breathing is unlabored. Heart: RRR S1 S2 without murmurs, rubs, or gallops.  Abdomen: Soft, non-distended. Extremities: No clubbing or cyanosis. No edema.  Distal pedal pulses are 2+ and equal bilaterally. Neuro: Alert and oriented X 3. Moves all extremities spontaneously. No focal deficits. Skin: Left upper chest / implant site intact without bleeding or hematoma. Minimal ecchymosis.  Intake/Output:  Intake/Output Summary (Last 24 hours) at 02/07/14 0834 Last data filed at 02/06/14 2115  Gross per 24 hour  Intake    720 ml  Output    500 ml  Net    220 ml    Inpatient Medications:  . mupirocin ointment   Nasal BID   . sodium chloride Stopped (02/05/14 2154)    Labs:  Recent Labs  02/05/14 0900  NA 142  K 4.4  CL 105  CO2 23  GLUCOSE 98  BUN 14  CREATININE 0.95  CALCIUM 9.2    Recent Labs  02/05/14 0900  WBC 4.8  HGB 14.4  HCT 42.9  MCV 83.0  PLT 162    Recent Labs  02/05/14 1407  TSH 0.819    Recent Labs  02/05/14 0900  INR 1.04    Radiology/Studies: CXR this AM shows stable lead placement; official report pending Telemetry: AV paced Device interrogation: performed by industry - normal PPM function    Assessment and Plan  Complete heart block s/p dual chamber PPM implantation yesterday   Transfer to tele  Home in am Will need to follow blood pressure Check ACE level CT chest no hilar  adenopathy Liver nodules CT followup recommended at 3-4 m

## 2014-02-07 NOTE — Progress Notes (Signed)
Pt discharged to home per MD order. Pt received and reviewed all discharge instructions and medication information including follow-up appointments, wound site care, mobility restrictiosn and prescription information. Pt verbalized understanding. Pt alert and oriented at discharge with no complaints of pain. Pt IV and telemetry box removed prior to discharge. Pt ambulated to private vehicle per pt request. [name]

## 2014-02-13 ENCOUNTER — Ambulatory Visit (INDEPENDENT_AMBULATORY_CARE_PROVIDER_SITE_OTHER): Payer: Medicare Other | Admitting: *Deleted

## 2014-02-13 ENCOUNTER — Encounter: Payer: Self-pay | Admitting: *Deleted

## 2014-02-13 DIAGNOSIS — I442 Atrioventricular block, complete: Secondary | ICD-10-CM

## 2014-02-13 LAB — MDC_IDC_ENUM_SESS_TYPE_INCLINIC
Brady Statistic RA Percent Paced: 20 %
Brady Statistic RV Percent Paced: 99.86 %
Date Time Interrogation Session: 20150402161232
Implantable Pulse Generator Model: 2240
Implantable Pulse Generator Serial Number: 7596315
Lead Channel Impedance Value: 487.5 Ohm
Lead Channel Pacing Threshold Pulse Width: 0.4 ms
Lead Channel Pacing Threshold Pulse Width: 0.4 ms
Lead Channel Sensing Intrinsic Amplitude: 12 mV
Lead Channel Setting Pacing Amplitude: 0.75 V
Lead Channel Setting Pacing Pulse Width: 0.4 ms
Lead Channel Setting Sensing Sensitivity: 5 mV
MDC IDC MSMT BATTERY REMAINING LONGEVITY: 122.4 mo
MDC IDC MSMT BATTERY VOLTAGE: 3.02 V
MDC IDC MSMT LEADCHNL RA IMPEDANCE VALUE: 525 Ohm
MDC IDC MSMT LEADCHNL RA PACING THRESHOLD AMPLITUDE: 0.5 V
MDC IDC MSMT LEADCHNL RA PACING THRESHOLD AMPLITUDE: 0.5 V
MDC IDC MSMT LEADCHNL RA PACING THRESHOLD PULSEWIDTH: 0.4 ms
MDC IDC MSMT LEADCHNL RA SENSING INTR AMPL: 5 mV
MDC IDC MSMT LEADCHNL RV PACING THRESHOLD AMPLITUDE: 0.5 V
MDC IDC SET LEADCHNL RA PACING AMPLITUDE: 3.5 V

## 2014-02-13 NOTE — Progress Notes (Signed)

## 2014-02-20 DIAGNOSIS — I10 Essential (primary) hypertension: Secondary | ICD-10-CM | POA: Diagnosis not present

## 2014-02-28 ENCOUNTER — Encounter: Payer: Self-pay | Admitting: Internal Medicine

## 2014-03-11 DIAGNOSIS — I10 Essential (primary) hypertension: Secondary | ICD-10-CM | POA: Diagnosis not present

## 2014-03-11 DIAGNOSIS — E785 Hyperlipidemia, unspecified: Secondary | ICD-10-CM | POA: Diagnosis not present

## 2014-03-11 DIAGNOSIS — Z8679 Personal history of other diseases of the circulatory system: Secondary | ICD-10-CM | POA: Diagnosis not present

## 2014-03-11 DIAGNOSIS — I442 Atrioventricular block, complete: Secondary | ICD-10-CM | POA: Diagnosis not present

## 2014-04-24 ENCOUNTER — Telehealth: Payer: Self-pay | Admitting: Internal Medicine

## 2014-04-24 NOTE — Telephone Encounter (Signed)
Monitor ordered for pt.  Spoke w/pt and pt aware.

## 2014-04-24 NOTE — Telephone Encounter (Signed)
New problem     Pt needs a call back about his re mote transmitter please.   He still does not have it.

## 2014-05-07 ENCOUNTER — Encounter: Payer: Self-pay | Admitting: *Deleted

## 2014-05-20 ENCOUNTER — Encounter: Payer: Medicare Other | Admitting: Internal Medicine

## 2014-08-05 DIAGNOSIS — Z950 Presence of cardiac pacemaker: Secondary | ICD-10-CM | POA: Diagnosis not present

## 2014-10-23 ENCOUNTER — Encounter (HOSPITAL_COMMUNITY): Payer: Self-pay | Admitting: Internal Medicine

## 2014-11-04 DIAGNOSIS — Z950 Presence of cardiac pacemaker: Secondary | ICD-10-CM | POA: Diagnosis not present

## 2015-02-03 DIAGNOSIS — Z950 Presence of cardiac pacemaker: Secondary | ICD-10-CM | POA: Diagnosis not present

## 2015-02-13 DIAGNOSIS — Z125 Encounter for screening for malignant neoplasm of prostate: Secondary | ICD-10-CM | POA: Diagnosis not present

## 2015-02-13 DIAGNOSIS — I10 Essential (primary) hypertension: Secondary | ICD-10-CM | POA: Diagnosis not present

## 2015-02-13 DIAGNOSIS — K219 Gastro-esophageal reflux disease without esophagitis: Secondary | ICD-10-CM | POA: Diagnosis not present

## 2015-02-13 DIAGNOSIS — Z23 Encounter for immunization: Secondary | ICD-10-CM | POA: Diagnosis not present

## 2015-02-13 DIAGNOSIS — Z950 Presence of cardiac pacemaker: Secondary | ICD-10-CM | POA: Diagnosis not present

## 2015-02-13 DIAGNOSIS — Z136 Encounter for screening for cardiovascular disorders: Secondary | ICD-10-CM | POA: Diagnosis not present

## 2015-02-13 DIAGNOSIS — Z0001 Encounter for general adult medical examination with abnormal findings: Secondary | ICD-10-CM | POA: Diagnosis not present

## 2015-02-13 DIAGNOSIS — Z79899 Other long term (current) drug therapy: Secondary | ICD-10-CM | POA: Diagnosis not present

## 2015-03-02 DIAGNOSIS — Z1211 Encounter for screening for malignant neoplasm of colon: Secondary | ICD-10-CM | POA: Diagnosis not present

## 2015-03-27 DIAGNOSIS — E780 Pure hypercholesterolemia: Secondary | ICD-10-CM | POA: Diagnosis not present

## 2015-03-27 DIAGNOSIS — Z950 Presence of cardiac pacemaker: Secondary | ICD-10-CM | POA: Diagnosis not present

## 2015-03-27 DIAGNOSIS — I10 Essential (primary) hypertension: Secondary | ICD-10-CM | POA: Diagnosis not present

## 2015-05-05 DIAGNOSIS — Z950 Presence of cardiac pacemaker: Secondary | ICD-10-CM | POA: Diagnosis not present

## 2015-08-04 DIAGNOSIS — Z950 Presence of cardiac pacemaker: Secondary | ICD-10-CM | POA: Diagnosis not present

## 2015-11-04 DIAGNOSIS — Z950 Presence of cardiac pacemaker: Secondary | ICD-10-CM | POA: Diagnosis not present

## 2015-12-01 IMAGING — DX DG CHEST 1V PORT
1 series · 1 of 1 positions shown · non-contrast
Comparison: None.

CLINICAL DATA: Implant

EXAM:
PORTABLE CHEST - 1 VIEW

[portable]
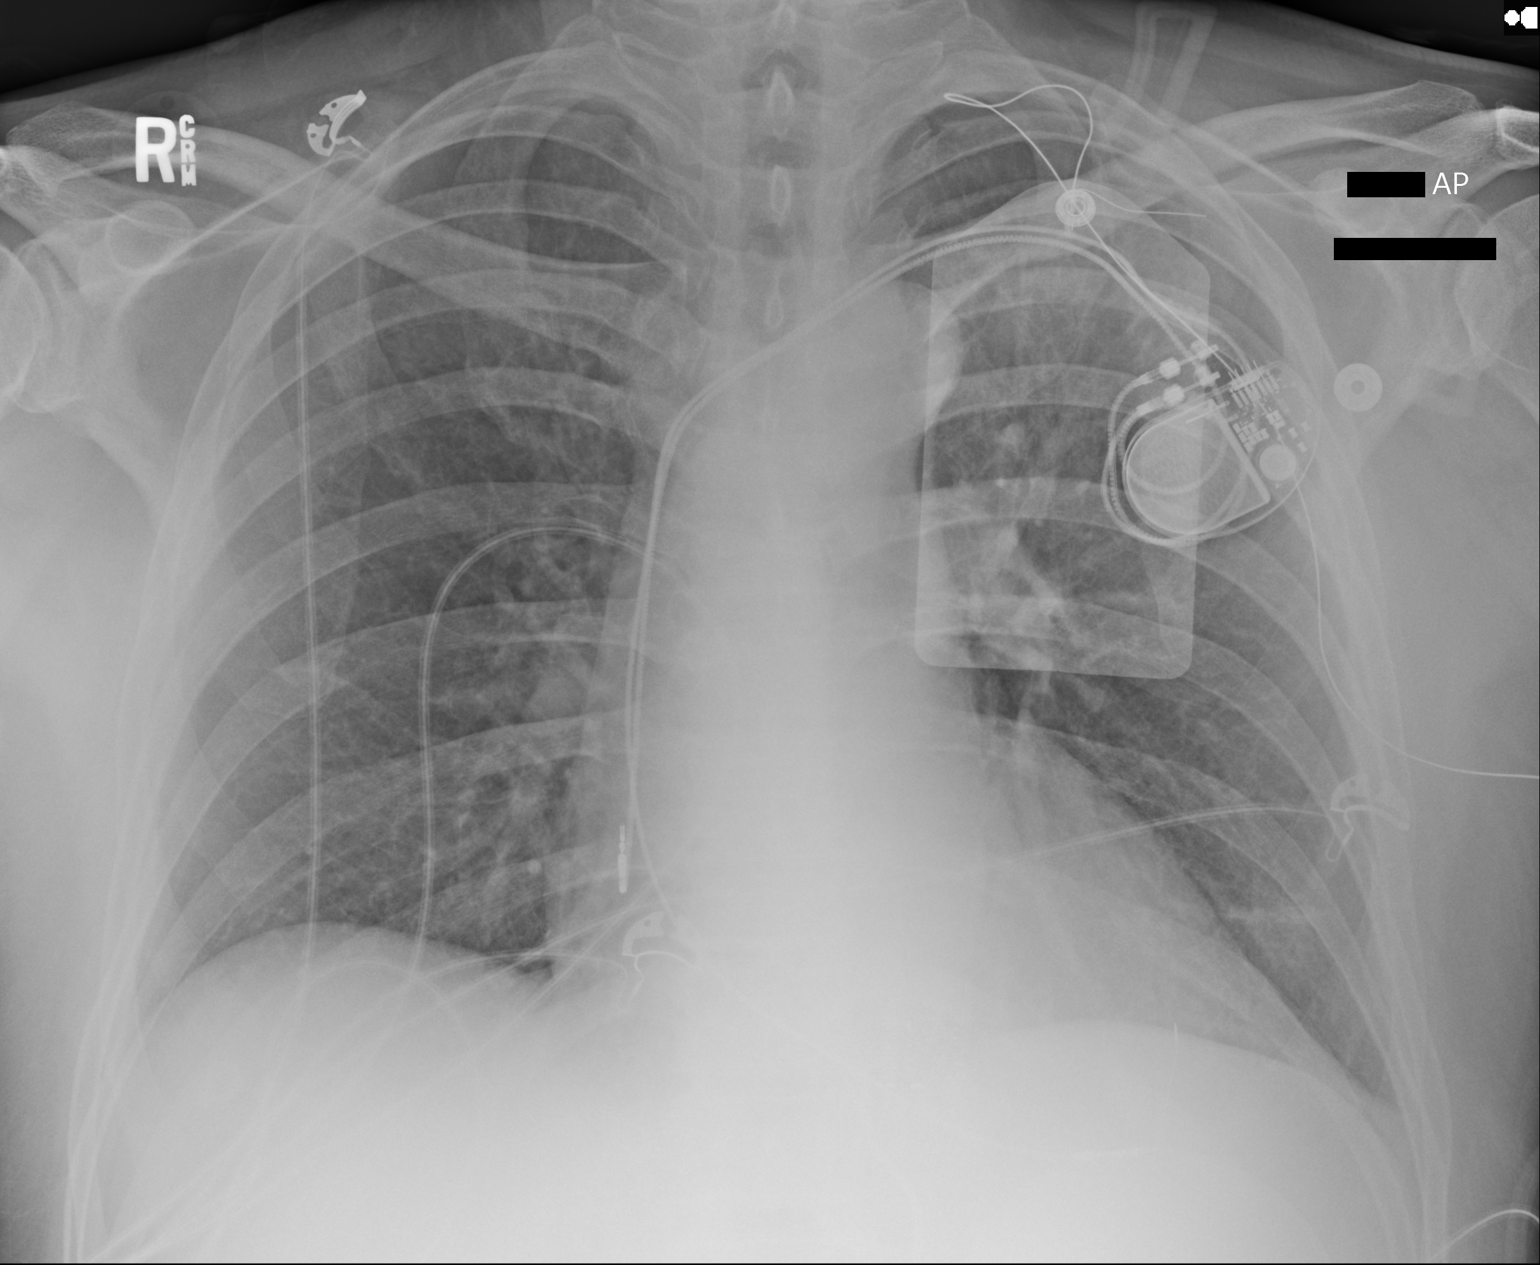

[1 of 1 positions shown; findings below may reference images not displayed]

FINDINGS: Left subclavian dual lead pacemaker device placed. Tips of the leads
project over the right atrium and right ventricle. No pneumothorax.
Linear atelectasis at the left base. Otherwise clear lungs. Upper
normal heart size.
IMPRESSION: No active disease.

## 2016-02-02 DIAGNOSIS — Z950 Presence of cardiac pacemaker: Secondary | ICD-10-CM | POA: Diagnosis not present

## 2016-02-22 DIAGNOSIS — E7800 Pure hypercholesterolemia, unspecified: Secondary | ICD-10-CM | POA: Diagnosis not present

## 2016-02-22 DIAGNOSIS — Z0000 Encounter for general adult medical examination without abnormal findings: Secondary | ICD-10-CM | POA: Diagnosis not present

## 2016-02-22 DIAGNOSIS — Z79899 Other long term (current) drug therapy: Secondary | ICD-10-CM | POA: Diagnosis not present

## 2016-02-22 DIAGNOSIS — K219 Gastro-esophageal reflux disease without esophagitis: Secondary | ICD-10-CM | POA: Diagnosis not present

## 2016-02-22 DIAGNOSIS — I10 Essential (primary) hypertension: Secondary | ICD-10-CM | POA: Diagnosis not present

## 2016-02-22 DIAGNOSIS — Z125 Encounter for screening for malignant neoplasm of prostate: Secondary | ICD-10-CM | POA: Diagnosis not present

## 2016-03-03 DIAGNOSIS — Z1211 Encounter for screening for malignant neoplasm of colon: Secondary | ICD-10-CM | POA: Diagnosis not present

## 2016-04-01 DIAGNOSIS — E7800 Pure hypercholesterolemia, unspecified: Secondary | ICD-10-CM | POA: Diagnosis not present

## 2016-04-01 DIAGNOSIS — I10 Essential (primary) hypertension: Secondary | ICD-10-CM | POA: Diagnosis not present

## 2016-04-01 DIAGNOSIS — Z950 Presence of cardiac pacemaker: Secondary | ICD-10-CM | POA: Diagnosis not present

## 2016-04-15 DIAGNOSIS — I10 Essential (primary) hypertension: Secondary | ICD-10-CM | POA: Diagnosis not present

## 2016-05-03 DIAGNOSIS — Z950 Presence of cardiac pacemaker: Secondary | ICD-10-CM | POA: Diagnosis not present

## 2016-05-13 DIAGNOSIS — I10 Essential (primary) hypertension: Secondary | ICD-10-CM | POA: Diagnosis not present

## 2016-05-13 DIAGNOSIS — Z950 Presence of cardiac pacemaker: Secondary | ICD-10-CM | POA: Diagnosis not present

## 2016-05-13 DIAGNOSIS — E7800 Pure hypercholesterolemia, unspecified: Secondary | ICD-10-CM | POA: Diagnosis not present

## 2016-07-25 DIAGNOSIS — E7800 Pure hypercholesterolemia, unspecified: Secondary | ICD-10-CM | POA: Diagnosis not present

## 2016-08-02 DIAGNOSIS — Z950 Presence of cardiac pacemaker: Secondary | ICD-10-CM | POA: Diagnosis not present

## 2016-11-11 DIAGNOSIS — I10 Essential (primary) hypertension: Secondary | ICD-10-CM | POA: Diagnosis not present

## 2016-11-11 DIAGNOSIS — E7800 Pure hypercholesterolemia, unspecified: Secondary | ICD-10-CM | POA: Diagnosis not present

## 2016-11-11 DIAGNOSIS — Z950 Presence of cardiac pacemaker: Secondary | ICD-10-CM | POA: Diagnosis not present

## 2017-01-31 DIAGNOSIS — Z950 Presence of cardiac pacemaker: Secondary | ICD-10-CM | POA: Diagnosis not present

## 2017-03-08 ENCOUNTER — Ambulatory Visit (INDEPENDENT_AMBULATORY_CARE_PROVIDER_SITE_OTHER): Payer: Medicare Other | Admitting: Orthopaedic Surgery

## 2017-03-08 ENCOUNTER — Ambulatory Visit (INDEPENDENT_AMBULATORY_CARE_PROVIDER_SITE_OTHER): Payer: Medicare Other

## 2017-03-08 DIAGNOSIS — M25511 Pain in right shoulder: Secondary | ICD-10-CM

## 2017-03-08 DIAGNOSIS — M7541 Impingement syndrome of right shoulder: Secondary | ICD-10-CM

## 2017-03-08 MED ORDER — LIDOCAINE HCL 1 % IJ SOLN
3.0000 mL | INTRAMUSCULAR | Status: AC | PRN
  Administered 2017-03-08: 3 mL

## 2017-03-08 MED ORDER — METHYLPREDNISOLONE ACETATE 40 MG/ML IJ SUSP
40.0000 mg | INTRAMUSCULAR | Status: AC | PRN
  Administered 2017-03-08: 40 mg via INTRA_ARTICULAR

## 2017-03-08 NOTE — Progress Notes (Signed)
Office Visit Note   Patient: [name]           Date of Birth: February 25, 1946           MRN: [id number] Visit Date: 03/08/2017              Requested by: No referring provider defined for this encounter. PCP: Pcp Not In System   Assessment & Plan: Visit Diagnoses:  1. Acute pain of right shoulder   2. Impingement syndrome of right shoulder     Plan: I recommended a steroid injection for his right shoulder for him and he agreed with this. He tolerated this well after risk and benefits discussion was had. We'll see him back in 4 to see how is doing overall. I did show him some exercises to try to increase his shoulder internal rotation with adduction.  Follow-Up Instructions: Return in about 4 weeks (around 04/05/2017).   Orders:  Orders Placed This Encounter  Procedures  . Large Joint Injection/Arthrocentesis  . XR Shoulder Right   No orders of the defined types were placed in this encounter.     Procedures: Large Joint Inj Date/Time: 03/08/2017 5:25 PM Performed by: Mcarthur Rossetti Authorized by: Mcarthur Rossetti   Location:  Shoulder Site:  R subacromial bursa Ultrasound Guidance: No   Fluoroscopic Guidance: No   Arthrogram: No   Medications:  3 mL lidocaine 1 %; 40 mg methylPREDNISolone acetate 40 MG/ML     Clinical Data: No additional findings.   Subjective: No chief complaint on file. Patient is a very pleasant right-hand-dominant 71 year old comes in with chief complaint of right shoulder pain. Is been worsening for approximately 3-5 months now. He's had no known injury. He is an avid golfer and it does hurt during his follow-through. He said reaching up behind him or overhead activities certainly causes pain. He has not been taking any type of medications for this. He denies any weakness in the shoulder but is definitely been painful to him. He is somewhat he does have history permanent pacemaker so we can not obtain an MRI. He said his pain  is daily and is certainly detrimental effect his activities daily living his quality of life. It's a nagging pain does wake him up at night as well. Is more activity related  HPI  Review of Systems He denies any headache, chest pain, neck pain, fever, chills, nausea, vomiting. He denies a nubs and tingling in his right hand.  Objective: Vital Signs: There were no vitals taken for this visit.  Physical Exam He is alert and oriented 3 in no acute distress Ortho Exam Examination of his right shoulder shows full range of motion except for internal rotation with adduction. With that motion he reaches only to the mid lumbar spine where he can reach much higher on his left side. His rotator cuff itself feels strong. His external rotation and abduction are full. He does have positive Neer and Hawkins signs. He is neurovascularly intact distally. Specialty Comments:  No specialty comments available.  Imaging: Xr Shoulder Right  Result Date: 03/08/2017 3 views AP outlet and axillary views of the right shoulder show decrease in the subacromial outlet suggesting impingement syndrome. His acromioclavicular joint is well maintained. His glenohumeral joint shows no acute findings.    PMFS History: Patient Active Problem List   Diagnosis Date Noted  . Impingement syndrome of right shoulder 03/08/2017  . Solitary kidney 02/05/2014  . GERD (gastroesophageal reflux disease) 02/05/2014  .  Dyslipidemia 02/05/2014  . Complete heart block ([place]) 02/05/2014  . Atrioventricular block, complete ([place]) 02/05/2014   Past Medical History:  Diagnosis Date  . Dyslipidemia   . GERD (gastroesophageal reflux disease)   . Solitary kidney     No family history on file.  Past Surgical History:  Procedure Laterality Date  . PERMANENT PACEMAKER INSERTION N/A 02/05/2014   Procedure: PERMANENT PACEMAKER INSERTION;  Surgeon: Deboraha Sprang, MD;  Location: [facility] C[facility] LAB;  Service: Cardiovascular;  Laterality: N/A;    Social History   Occupational History  . Not on file.   Social History Main Topics  . Smoking status: Never Smoker  . Smokeless tobacco: Not on file  . Alcohol use 0.0 oz/week     Comment: Drinks 2 ounces of liquor daily  . Drug use: No  . Sexual activity: Not on file

## 2017-03-27 DIAGNOSIS — E7800 Pure hypercholesterolemia, unspecified: Secondary | ICD-10-CM | POA: Diagnosis not present

## 2017-04-03 DIAGNOSIS — Z136 Encounter for screening for cardiovascular disorders: Secondary | ICD-10-CM | POA: Diagnosis not present

## 2017-04-03 DIAGNOSIS — E7800 Pure hypercholesterolemia, unspecified: Secondary | ICD-10-CM | POA: Diagnosis not present

## 2017-04-03 DIAGNOSIS — I10 Essential (primary) hypertension: Secondary | ICD-10-CM | POA: Diagnosis not present

## 2017-04-03 DIAGNOSIS — Z950 Presence of cardiac pacemaker: Secondary | ICD-10-CM | POA: Diagnosis not present

## 2017-04-05 ENCOUNTER — Ambulatory Visit (INDEPENDENT_AMBULATORY_CARE_PROVIDER_SITE_OTHER): Payer: Medicare Other | Admitting: Orthopaedic Surgery

## 2017-04-05 DIAGNOSIS — M7541 Impingement syndrome of right shoulder: Secondary | ICD-10-CM

## 2017-04-05 NOTE — Progress Notes (Signed)
The patient is following up after a steroid injection subacromial space of his right shoulder for impingement syndrome. He says his shoulder is doing great after that but is still "locks up on occasion".  On examination he has excellent range of motion of the shoulder but he does have positive Neer and Hawkins signs. His plain films before did show a bone spur underneath the distal clavicle. There is decrease in the subacromial outlet as well.  At this point we'll start off on any other treatment and I agree with this. I do feel that another injection in 4 weeks to be warranted if he is having enough pain. He understands this as well and the like to see him back in 4 weeks for a repeat subacromial injection. The only option for him would be a subacromial decompression arthroscopically. He understands this as well. We will not MRI shoulder due to a pacemaker.

## 2017-04-25 DIAGNOSIS — Z136 Encounter for screening for cardiovascular disorders: Secondary | ICD-10-CM | POA: Diagnosis not present

## 2017-04-25 DIAGNOSIS — I10 Essential (primary) hypertension: Secondary | ICD-10-CM | POA: Diagnosis not present

## 2017-05-02 DIAGNOSIS — I10 Essential (primary) hypertension: Secondary | ICD-10-CM | POA: Diagnosis not present

## 2017-05-10 ENCOUNTER — Ambulatory Visit (INDEPENDENT_AMBULATORY_CARE_PROVIDER_SITE_OTHER): Payer: Medicare Other | Admitting: Orthopaedic Surgery

## 2017-05-10 DIAGNOSIS — M7541 Impingement syndrome of right shoulder: Secondary | ICD-10-CM | POA: Diagnosis not present

## 2017-05-10 MED ORDER — LIDOCAINE HCL 1 % IJ SOLN
3.0000 mL | INTRAMUSCULAR | Status: AC | PRN
  Administered 2017-05-10: 3 mL

## 2017-05-10 MED ORDER — METHYLPREDNISOLONE ACETATE 40 MG/ML IJ SUSP
40.0000 mg | INTRAMUSCULAR | Status: AC | PRN
  Administered 2017-05-10: 40 mg via INTRA_ARTICULAR

## 2017-05-10 NOTE — Progress Notes (Signed)
   Office Visit Note   Patient: [name]           Date of Birth: 02-Mar-1946           MRN: [id number] Visit Date: 05/10/2017              Requested by: No referring provider defined for this encounter. PCP: System, Pcp Not In   Assessment & Plan: Visit Diagnoses:  1. Impingement syndrome of right shoulder     Plan: He tolerated the steroid injection well on his right shoulder. He'll follow up as needed. We talked about things he can do to get his shoulder calm down. He should wait at least 3-4 months before the next injection if needed.  Follow-Up Instructions: Return if symptoms worsen or fail to improve.   Orders:  Orders Placed This Encounter  Procedures  . Large Joint Injection/Arthrocentesis   No orders of the defined types were placed in this encounter.     Procedures: Large Joint Inj Date/Time: 05/10/2017 4:17 PM Performed by: Mcarthur Rossetti Authorized by: Mcarthur Rossetti   Location:  Shoulder Site:  R subacromial bursa Ultrasound Guidance: No   Fluoroscopic Guidance: No   Arthrogram: No   Medications:  3 mL lidocaine 1 %; 40 mg methylPREDNISolone acetate 40 MG/ML     Clinical Data: No additional findings.   Subjective: No chief complaint on file. The patient comes in with chief complaint of right shoulder pain. I've seen him before for the same symptoms. He has impingement syndrome. He is requesting a steroid injection in the right shoulder today. We placed one back in April now were greatly. He still having problems with reaching in front and overhead. He is not interested in any type of surgical intervention.  HPI  Review of Systems Currently denies any headache, chest pain, short of breath, fever, chills, nausea, vomiting.  Objective: Vital Signs: There were no vitals taken for this visit.  Physical Exam He is alert and oriented 3 in no acute distress Ortho Exam Examination his right shoulder shows impingement syndrome  on exam. His get good strength. Specialty Comments:  No specialty comments available.  Imaging: No results found.   PMFS History: Patient Active Problem List   Diagnosis Date Noted  . Impingement syndrome of right shoulder 03/08/2017  . Solitary kidney 02/05/2014  . GERD (gastroesophageal reflux disease) 02/05/2014  . Dyslipidemia 02/05/2014  . Complete heart block ([place]) 02/05/2014  . Atrioventricular block, complete ([place]) 02/05/2014   Past Medical History:  Diagnosis Date  . Dyslipidemia   . GERD (gastroesophageal reflux disease)   . Solitary kidney     No family history on file.  Past Surgical History:  Procedure Laterality Date  . PERMANENT PACEMAKER INSERTION N/A 02/05/2014   Procedure: PERMANENT PACEMAKER INSERTION;  Surgeon: Deboraha Sprang, MD;  Location: [facility] CATH LAB;  Service: Cardiovascular;  Laterality: N/A;   Social History   Occupational History  . Not on file.   Social History Main Topics  . Smoking status: Never Smoker  . Smokeless tobacco: Not on file  . Alcohol use 0.0 oz/week     Comment: Drinks 2 ounces of liquor daily  . Drug use: No  . Sexual activity: Not on file

## 2017-08-02 DIAGNOSIS — Z950 Presence of cardiac pacemaker: Secondary | ICD-10-CM | POA: Diagnosis not present

## 2017-08-02 DIAGNOSIS — I442 Atrioventricular block, complete: Secondary | ICD-10-CM | POA: Diagnosis not present

## 2017-08-02 DIAGNOSIS — Z45010 Encounter for checking and testing of cardiac pacemaker pulse generator [battery]: Secondary | ICD-10-CM | POA: Diagnosis not present

## 2017-09-25 ENCOUNTER — Telehealth: Payer: Self-pay | Admitting: *Deleted

## 2017-09-25 NOTE — Telephone Encounter (Signed)
Copied from [place]. Topic: General - Other >> Sep 25, 2017  9:43 AM Neva Seat wrote: New Pt from [place].  Please send new pt paperwork.

## 2017-10-20 ENCOUNTER — Ambulatory Visit (INDEPENDENT_AMBULATORY_CARE_PROVIDER_SITE_OTHER): Payer: Medicare Other | Admitting: Internal Medicine

## 2017-10-20 ENCOUNTER — Encounter: Payer: Self-pay | Admitting: Internal Medicine

## 2017-10-20 VITALS — BP 132/100 | HR 62 | Temp 98.0°F | Resp 16 | Ht 73.0 in | Wt 232.0 lb

## 2017-10-20 DIAGNOSIS — I2510 Atherosclerotic heart disease of native coronary artery without angina pectoris: Secondary | ICD-10-CM

## 2017-10-20 DIAGNOSIS — R3912 Poor urinary stream: Secondary | ICD-10-CM

## 2017-10-20 DIAGNOSIS — Z23 Encounter for immunization: Secondary | ICD-10-CM

## 2017-10-20 DIAGNOSIS — E785 Hyperlipidemia, unspecified: Secondary | ICD-10-CM

## 2017-10-20 DIAGNOSIS — IMO0002 Reserved for concepts with insufficient information to code with codable children: Secondary | ICD-10-CM

## 2017-10-20 DIAGNOSIS — N401 Enlarged prostate with lower urinary tract symptoms: Secondary | ICD-10-CM | POA: Diagnosis not present

## 2017-10-20 DIAGNOSIS — I10 Essential (primary) hypertension: Secondary | ICD-10-CM | POA: Diagnosis not present

## 2017-10-20 DIAGNOSIS — K219 Gastro-esophageal reflux disease without esophagitis: Secondary | ICD-10-CM | POA: Diagnosis not present

## 2017-10-20 DIAGNOSIS — Q600 Renal agenesis, unilateral: Secondary | ICD-10-CM | POA: Diagnosis not present

## 2017-10-20 DIAGNOSIS — Z125 Encounter for screening for malignant neoplasm of prostate: Secondary | ICD-10-CM | POA: Diagnosis not present

## 2017-10-20 DIAGNOSIS — I442 Atrioventricular block, complete: Secondary | ICD-10-CM

## 2017-10-20 DIAGNOSIS — Z1329 Encounter for screening for other suspected endocrine disorder: Secondary | ICD-10-CM

## 2017-10-20 DIAGNOSIS — N400 Enlarged prostate without lower urinary tract symptoms: Secondary | ICD-10-CM | POA: Insufficient documentation

## 2017-10-20 MED ORDER — TAMSULOSIN HCL 0.4 MG PO CAPS: 0.4000 mg | ORAL_CAPSULE | Freq: Every day | ORAL | 0 refills | Status: DC

## 2017-10-20 MED ORDER — CARVEDILOL 6.25 MG PO TABS: 6.2500 mg | ORAL_TABLET | Freq: Two times a day (BID) | ORAL | 0 refills | Status: DC

## 2017-10-20 NOTE — Progress Notes (Signed)
Chief Complaint  Patient presents with  . Establish Care   New patient used to see [facility] in [place] last seen 1.5 years ago  1. HTN with h/o left atrophic kidney BP was 132/100 repeat 138/98 on Metoprolol 25 mg bid, Losartan-HCT 100-12.5 qd  2. H/o HLD can only tolerate Pravastatin 20 mg qhs not higher doses.  3. H/o AV block s/p pacemaker by Dr. Caryl Comes in 2015 and primary cardiologist is Dr. Nadyne Coombes.   4. He c/o urinary urgency, trouble staring stream, and weak stream. Never tried any medication for this    Review of Systems  Constitutional: Negative for weight loss.  HENT: Positive for tinnitus. Negative for hearing loss.        +anosmia  -years but getting worse strong odors can still smell   Eyes:       H/o left early cataract per pt   Respiratory: Negative for shortness of breath.   Cardiovascular: Negative for chest pain.  Gastrointestinal: Negative for abdominal pain and blood in stool.  Genitourinary: Positive for frequency and urgency.       +weak stream   Musculoskeletal: Negative for joint pain.  Skin: Negative for rash.  Neurological: Negative for headaches.   Past Medical History:  Diagnosis Date  . Atrophic kidney    left   . Chicken pox   . Dyslipidemia   . Gallstones   . GERD (gastroesophageal reflux disease)   . Hyperlipidemia   . Hypertension   . Kidney stones   . Solitary kidney    Past Surgical History:  Procedure Laterality Date  . PACEMAKER PLACEMENT     2015 Dr. Caryl Comes for AV block  . PERMANENT PACEMAKER INSERTION N/A 02/05/2014   Procedure: PERMANENT PACEMAKER INSERTION;  Surgeon: Deboraha Sprang, MD;  Location[facility] C[facility] LAB;  Service: Cardiovascular;  Laterality: N/A;   No family history on file. Social History   Socioeconomic History  . Marital status: Single    Spouse name: Not on file  . Number of children: Not on file  . Years of education: Not on file  . Highest education level: Not on file  Social Needs  . Financial  resource strain: Not on file  . Food insecurity - worry: Not on file  . Food insecurity - inability: Not on file  . Transportation needs - medical: Not on file  . Transportation needs - non-medical: Not on file  Occupational History  . Not on file  Tobacco Use  . Smoking status: Former Smoker    Last attempt to quit: 10/21/1991    Years since quitting: 26.0  . Smokeless tobacco: Never Used  . Tobacco comment: smoked [id number] <1 ppd no FH lung cancer   Substance and Sexual Activity  . Alcohol use: Yes    Alcohol/week: 0.0 oz    Comment: Drinks 2-3 ounces of liquor daily  . Drug use: No  . Sexual activity: Not on file  Other Topics Concern  . Not on file  Social History Narrative   Used to be in the [employer]    2 kids    Married    Born [place] grew up in [place]    Current Meds  Medication Sig  . losartan-hydrochlorothiazide (HYZAAR) 100-12.5 MG tablet Take 1 tablet by mouth daily.  Marland Kitchen omeprazole (PRILOSEC) 40 MG capsule Take 40 mg by mouth daily.   . pravastatin (PRAVACHOL) 20 MG tablet   . [DISCONTINUED] metoprolol tartrate (LOPRESSOR) 25 MG tablet    Allergies  Allergen Reactions  . Lipitor [Atorvastatin]     myalgias  . Lisinopril     Cough   . Simvastatin     myalgias   No results found for this or any previous visit (from the past 2160 hour(s)). Objective  Body mass index is 30.61 kg/m. Wt Readings from Last 3 Encounters:  10/20/17 232 lb (105.2 kg)  02/07/14 219 lb 4.3 oz (99.5 kg)   Temp Readings from Last 3 Encounters:  10/20/17 98 F (36.7 C) (Oral)  02/07/14 98.3 F (36.8 C) (Oral)   BP Readings from Last 3 Encounters:  10/20/17 (!) 132/100  02/07/14 136/89   Pulse Readings from Last 3 Encounters:  10/20/17 62  02/07/14 67   Pulse oximetry on room air is 97%  Physical Exam  Constitutional: He is oriented to person, place, and time and well-developed, well-nourished, and in no distress.  HENT:  Head: Normocephalic and atraumatic.   Mouth/Throat: Oropharynx is clear and moist and mucous membranes are normal.  Eyes: Conjunctivae are normal. Pupils are equal, round, and reactive to light.  Cardiovascular: Normal rate, regular rhythm and normal heart sounds.  No murmur heard. Neg leg edema b/l   Pulmonary/Chest: Effort normal and breath sounds normal.  Abdominal: Soft. Bowel sounds are normal. There is no tenderness.  Neurological: He is alert and oriented to person, place, and time. Gait normal.  Skin: Skin is warm, dry and intact.  Multiple nevi to skin lentigos  Sks and skin tags   Psychiatric: Mood, memory, affect and judgment normal.  Nursing note and vitals reviewed.  Assessment   1. HTN with atrophic left kidney and +right kidney  2. HLD  3. H/o CAD on imaging w/o chest pain, h/o echo with mild LVH and mild valve dysfunction 2015, h/o AV block s/p pacemaker  4. GERD 5. Likely BPH sx's (+urgency, +trouble starting stream, +weak stream) 6. Hm   Plan  1.  D/c Metop 25 mg bid change to coreg 6.25 mg bid titrate up at f/u if can tolerate if not add norvasc low dose  Cont Losartan/hct 100-12.5 mg qd   Log BP and call if >140/>90 after new change before next appt   Check labs 12/17 fasting CMET, CBC, lipid, UA, TSH, PSA declines STD, hep B/C testing   2.  Unable to tolerate high intensity statins and higher dose of pravachol 20 mg qhs   Cont current dose see labs as above   3. F/u with cardiologist Dr. Nadyne Coombes (when was last f/u ask pt at f/u) Will ask at f/u if on Aspirin 81 mg qd  4.  Cont meds  5.  Trial of Flomax qhs.  -Other options if this does not work would be finasteride, dutasteride, Cialis (if ED) or combo tx with Doxazosin and finasteride or Dutasteride with Flomax   6.  Had flu shot 08/2017 Given prevnar today will need pna 23 in 1 year  Tdap rec given info to pt  Disc shingrix today he had zostavax in the past 2013   Colonoscopy reviewed had 11/01/99 hyperplastic polyp rec  colonoscopy again pt thinking about also disc cologaurd and given info   Calc CT chest risk calculator and consider repeat CT chest/ab/pelvis w/o contrast in future to f/u on hilar adenopathy, liver lesions noted previous imaging reviewed disc with pt   Mood nl per pt   Eye MD saw w/in last year has mild cataract left eye needs to f/u   Do DRE at f/u  Labs as above.    A lot of nevi rec skin check tbse never had   Declines Hep B/C, STD testing.    Also consider w/u anosmia and tinnitis pt does not seem interested in w/u today tinnitis chronic and anosmia x years though worsening can still smell strong odors.    Provider: Dr. Olivia Mackie McLean-Scocuzza

## 2017-10-20 NOTE — Patient Instructions (Addendum)
Stop Metoprolol 25 2x per day  Start Coreg 6.25 mg 2x per day  Check blood pressure and call if >140/>90.  We may need to increase Coreg to the next dose   Please schedule labs 12/17  Please come back 11/13/17 for follow up  Happy Holidays Think about Cologaurd, dermatology for skin check   Benign Prostatic Hyperplasia Benign prostatic hyperplasia is when the prostate gland is bigger than normal (enlarged). The prostate is a gland that produces the fluid that goes into semen. It is near the opening to the bladder and it surrounds the tube that drains urine out of the body (urethra). Benign prostatic hyperplasia is common among older men and it typically causes problems with urinating. The prostate grows slowly as you age. As the prostate grows, it can pinch the urethra. This causes the bladder to work too hard to pass urine, which leads to a thickened bladder wall. The bladder may eventually become weak and unable to empty completely. What are the causes? The exact cause of this condition is not known. It may be related to changes in hormones as the body ages. What increases the risk? You are more likely to develop this condition if:  You have a family history of the condition.  You are age 64 or older.  You have a history of erectile dysfunction.  You do not exercise.  You have certain medical conditions, including: ? Type 2 diabetes. ? Obesity. ? Heart and circulatory disease.  What are the signs or symptoms? Symptoms of this condition include:  Weak or interrupted urine stream.  Dribbling or leaking urine.  Feeling like the bladder has not emptied completely.  Difficulty starting urination.  Getting up frequently at night to urinate.  Urinating more often (8 or more times a day).  Accidental loss of urine (urinary incontinence).  Pain during urination or ejaculation.  Urine with an unusual smell or color.  The size of the prostate does not always determine the  severity of the symptoms. For example, a man with a large prostate may experience minor symptoms, or a man with a smaller prostate may experience a severe blockage. How is this diagnosed? This condition may be diagnosed based on:  Your medical history and symptoms.  A physical exam. This usually includes a digital rectal exam. During this exam, your health care provider places a gloved, lubricated finger into the rectum to feel the size of the prostate.  A blood test. This test checks for high levels of a protein that is produced by the prostate (prostate specific antigen, PSA).  Tests to examine how well the urethra and bladder are functioning (urodynamic tests).  Cystoscopy. For this test, a small, tube-shaped instrument (cystoscope) is used to look inside the urethra and bladder. The cystoscope is placed into the urinary tract through the opening at the tip of the penis.  Urine tests.  Ultrasound.  How is this treated? Treatment for this condition depends on how severe your symptoms are. Treatment may include:  Active surveillance or "watchful waiting." If your symptoms are mild, your health care provider may delay treatment and ask you to keep track of your symptoms. You will have regular checkups to examine the size of your prostate, discuss symptoms, and determine whether treatment is needed.  Medicines. These may be used to: ? Stop prostate growth. ? Shrink the prostate. ? Relieve symptoms.  Lifestyle changes, including: ? Pelvic floor muscle exercises. The pelvic floor muscles are a group of muscles that  relax when you urinate. ? Bladder training. This involves exercises that train the bladder to hold more urine for longer periods. ? Reducing the amount of liquid that you drink. This is especially important before sleeping and before long periods of time spent in public. ? Reducing the amount of caffeine and alcohol that you drink. ? Treating or preventing  constipation.  Surgery to reduce the size of the prostate or widen the urethra. This is typically done if your symptoms are severe or there are serious complications from the enlarged prostate.  Follow these instructions at home: Medicines  Take over-the-counter and prescription medicines as told by your health care provider.  Avoid certain medicines, such as decongestants, antihistamines, and some prescription medicines as told by your health care provider. Ask your health care provider which medicines you should avoid. General instructions  Monitor your symptoms for any changes. Tell your health care provider about any changes.  Give yourself time when you urinate.  Avoid certain beverages that can irritate the bladder, such as: ? Alcohol. ? Caffeinated drinks like coffee, tea, and cola.  Avoid drinking large amounts of liquid before bed or before going out in public.  Do pelvic floor muscle or bladder training exercises as told by your health care provider.  Keep all follow-up visits as told by your health care provider. This is important. Contact a health care provider if:  Your develop new or worse symptoms.  You have trouble getting or maintaining an erection.  You have a fever.  You have pain or burning during urination.  You have blood in your urine. Get help right away if:  You have severe pain when urinating.  You cannot urinate.  You have severe pain in your abdomen.  You are dizzy.  You faint.  You have severe back pain.  Your urine is dark red and difficult to see through.  You have large blood clots in your urine.  You have severe pain after an erection.  You have chest pain, dizziness, or nausea during sexual activity. Summary  The prostate is a gland that produces the fluid that goes into semen. It is near the opening to the bladder and it surrounds the tube that drains urine out of the body (urethra).  Benign prostatic hyperplasia is  common among older men and it typically causes problems with urinating.  If your symptoms are mild, your health care provider may delay treatment and ask you to keep track of your symptoms. You will have regular checkups to examine the size of your prostate, discuss symptoms, and determine whether treatment is needed.  If directed, you may need to avoid certain medicines, such as decongestants, antihistamines, and some prescription medicines.  Contact your health care provider if you develop new or worse symptoms. This information is not intended to replace advice given to you by your health care provider. Make sure you discuss any questions you have with your health care provider. Document Released: 10/31/2005 Document Revised: 09/19/2016 Document Reviewed: 09/19/2016   Hypertension Hypertension, commonly called high blood pressure, is when the force of blood pumping through the arteries is too strong. The arteries are the blood vessels that carry blood from the heart throughout the body. Hypertension forces the heart to work harder to pump blood and may cause arteries to become narrow or stiff. Having untreated or uncontrolled hypertension can cause heart attacks, strokes, kidney disease, and other problems. A blood pressure reading consists of a higher number over a lower number. Ideally,  your blood pressure should be below 120/80. The first ("top") number is called the systolic pressure. It is a measure of the pressure in your arteries as your heart beats. The second ("bottom") number is called the diastolic pressure. It is a measure of the pressure in your arteries as the heart relaxes. What are the causes? The cause of this condition is not known. What increases the risk? Some risk factors for high blood pressure are under your control. Others are not. Factors you can change Smoking. Having type 2 diabetes mellitus, high cholesterol, or both. Not getting enough exercise or physical  activity. Being overweight. Having too much fat, sugar, calories, or salt (sodium) in your diet. Drinking too much alcohol. Factors that are difficult or impossible to change Having chronic kidney disease. Having a family history of high blood pressure. Age. Risk increases with age. Race. You may be at higher risk if you are African-American. Gender. Men are at higher risk than women before age 7. After age 2, women are at higher risk than men. Having obstructive sleep apnea. Stress. What are the signs or symptoms? Extremely high blood pressure (hypertensive crisis) may cause: Headache. Anxiety. Shortness of breath. Nosebleed. Nausea and vomiting. Severe chest pain. Jerky movements you cannot control (seizures).  How is this diagnosed? This condition is diagnosed by measuring your blood pressure while you are seated, with your arm resting on a surface. The cuff of the blood pressure monitor will be placed directly against the skin of your upper arm at the level of your heart. It should be measured at least twice using the same arm. Certain conditions can cause a difference in blood pressure between your right and left arms. Certain factors can cause blood pressure readings to be lower or higher than normal (elevated) for a short period of time: When your blood pressure is higher when you are in a health care provider's office than when you are at home, this is called white coat hypertension. Most people with this condition do not need medicines. When your blood pressure is higher at home than when you are in a health care provider's office, this is called masked hypertension. Most people with this condition may need medicines to control blood pressure.  If you have a high blood pressure reading during one visit or you have normal blood pressure with other risk factors: You may be asked to return on a different day to have your blood pressure checked again. You may be asked to monitor  your blood pressure at home for 1 week or longer.  If you are diagnosed with hypertension, you may have other blood or imaging tests to help your health care provider understand your overall risk for other conditions. How is this treated? This condition is treated by making healthy lifestyle changes, such as eating healthy foods, exercising more, and reducing your alcohol intake. Your health care provider may prescribe medicine if lifestyle changes are not enough to get your blood pressure under control, and if: Your systolic blood pressure is above 130. Your diastolic blood pressure is above 80.  Your personal target blood pressure may vary depending on your medical conditions, your age, and other factors. Follow these instructions at home: Eating and drinking Eat a diet that is high in fiber and potassium, and low in sodium, added sugar, and fat. An example eating plan is called the DASH (Dietary Approaches to Stop Hypertension) diet. To eat this way: Eat plenty of fresh fruits and vegetables. Try to  fill half of your plate at each meal with fruits and vegetables. Eat whole grains, such as whole wheat pasta, brown rice, or whole grain bread. Fill about one quarter of your plate with whole grains. Eat or drink low-fat dairy products, such as skim milk or low-fat yogurt. Avoid fatty cuts of meat, processed or cured meats, and poultry with skin. Fill about one quarter of your plate with lean proteins, such as fish, chicken without skin, beans, eggs, and tofu. Avoid premade and processed foods. These tend to be higher in sodium, added sugar, and fat. Reduce your daily sodium intake. Most people with hypertension should eat less than 1,500 mg of sodium a day. Limit alcohol intake to no more than 1 drink a day for nonpregnant women and 2 drinks a day for men. One drink equals 12 oz of beer, 5 oz of wine, or 1 oz of hard liquor. Lifestyle Work with your health care provider to maintain a healthy body  weight or to lose weight. Ask what an ideal weight is for you. Get at least 30 minutes of exercise that causes your heart to beat faster (aerobic exercise) most days of the week. Activities may include walking, swimming, or biking. Include exercise to strengthen your muscles (resistance exercise), such as pilates or lifting weights, as part of your weekly exercise routine. Try to do these types of exercises for 30 minutes at least 3 days a week. Do not use any products that contain nicotine or tobacco, such as cigarettes and e-cigarettes. If you need help quitting, ask your health care provider. Monitor your blood pressure at home as told by your health care provider. Keep all follow-up visits as told by your health care provider. This is important. Medicines Take over-the-counter and prescription medicines only as told by your health care provider. Follow directions carefully. Blood pressure medicines must be taken as prescribed. Do not skip doses of blood pressure medicine. Doing this puts you at risk for problems and can make the medicine less effective. Ask your health care provider about side effects or reactions to medicines that you should watch for. Contact a health care provider if: You think you are having a reaction to a medicine you are taking. You have headaches that keep coming back (recurring). You feel dizzy. You have swelling in your ankles. You have trouble with your vision. Get help right away if: You develop a severe headache or confusion. You have unusual weakness or numbness. You feel faint. You have severe pain in your chest or abdomen. You vomit repeatedly. You have trouble breathing. Summary Hypertension is when the force of blood pumping through your arteries is too strong. If this condition is not controlled, it may put you at risk for serious complications. Your personal target blood pressure may vary depending on your medical conditions, your age, and other  factors. For most people, a normal blood pressure is less than 120/80. Hypertension is treated with lifestyle changes, medicines, or a combination of both. Lifestyle changes include weight loss, eating a healthy, low-sodium diet, exercising more, and limiting alcohol. This information is not intended to replace advice given to you by your health care provider. Make sure you discuss any questions you have with your health care provider. Document Released: 10/31/2005 Document Revised: 09/28/2016 Document Reviewed: 09/28/2016 Elsevier Interactive Patient Education  2018 [employer].   Chartered certified accountant Patient Education  [employer].

## 2017-10-31 DIAGNOSIS — Z8679 Personal history of other diseases of the circulatory system: Secondary | ICD-10-CM | POA: Diagnosis not present

## 2017-10-31 DIAGNOSIS — Z45018 Encounter for adjustment and management of other part of cardiac pacemaker: Secondary | ICD-10-CM | POA: Diagnosis not present

## 2017-10-31 DIAGNOSIS — Z950 Presence of cardiac pacemaker: Secondary | ICD-10-CM | POA: Diagnosis not present

## 2017-11-06 ENCOUNTER — Other Ambulatory Visit (INDEPENDENT_AMBULATORY_CARE_PROVIDER_SITE_OTHER): Payer: Medicare Other

## 2017-11-06 DIAGNOSIS — Z1329 Encounter for screening for other suspected endocrine disorder: Secondary | ICD-10-CM | POA: Diagnosis not present

## 2017-11-06 DIAGNOSIS — Z125 Encounter for screening for malignant neoplasm of prostate: Secondary | ICD-10-CM

## 2017-11-06 DIAGNOSIS — N401 Enlarged prostate with lower urinary tract symptoms: Secondary | ICD-10-CM

## 2017-11-06 DIAGNOSIS — E785 Hyperlipidemia, unspecified: Secondary | ICD-10-CM | POA: Diagnosis not present

## 2017-11-06 DIAGNOSIS — R3912 Poor urinary stream: Secondary | ICD-10-CM

## 2017-11-06 DIAGNOSIS — I10 Essential (primary) hypertension: Secondary | ICD-10-CM

## 2017-11-06 LAB — URINALYSIS, ROUTINE W REFLEX MICROSCOPIC
BILIRUBIN URINE: NEGATIVE
Hgb urine dipstick: NEGATIVE
KETONES UR: NEGATIVE
LEUKOCYTES UA: NEGATIVE
Nitrite: NEGATIVE
PH: 6 (ref 5.0–8.0)
RBC / HPF: NONE SEEN (ref 0–?)
Specific Gravity, Urine: 1.02 (ref 1.000–1.030)
Total Protein, Urine: NEGATIVE
UROBILINOGEN UA: 0.2 (ref 0.0–1.0)
Urine Glucose: NEGATIVE
WBC, UA: NONE SEEN (ref 0–?)

## 2017-11-06 LAB — COMPREHENSIVE METABOLIC PANEL
ALK PHOS: 51 U/L (ref 39–117)
ALT: 16 U/L (ref 0–53)
AST: 16 U/L (ref 0–37)
Albumin: 4.3 g/dL (ref 3.5–5.2)
BILIRUBIN TOTAL: 1.2 mg/dL (ref 0.2–1.2)
BUN: 21 mg/dL (ref 6–23)
CALCIUM: 9 mg/dL (ref 8.4–10.5)
CO2: 30 mEq/L (ref 19–32)
CREATININE: 1.2 mg/dL (ref 0.40–1.50)
Chloride: 101 mEq/L (ref 96–112)
GFR: 63.57 mL/min (ref >60.00)
Glucose, Bld: 110 mg/dL — ABNORMAL HIGH (ref 70–99)
Potassium: 3.8 mEq/L (ref 3.5–5.1)
Sodium: 138 mEq/L (ref 135–145)
TOTAL PROTEIN: 6.9 g/dL (ref 6.0–8.3)

## 2017-11-06 LAB — TSH: TSH: 1.51 u[IU]/mL (ref 0.35–4.50)

## 2017-11-06 LAB — CBC WITH DIFFERENTIAL/PLATELET
BASOS ABS: 0 10*3/uL (ref 0.0–0.1)
Basophils Relative: 0.8 % (ref 0.0–3.0)
Eosinophils Absolute: 0.2 10*3/uL (ref 0.0–0.7)
Eosinophils Relative: 3.6 % (ref 0.0–5.0)
HEMATOCRIT: 45.5 % (ref 39.0–52.0)
HEMOGLOBIN: 15.1 g/dL (ref 13.0–17.0)
LYMPHS PCT: 31.8 % (ref 12.0–46.0)
Lymphs Abs: 1.4 10*3/uL (ref 0.7–4.0)
MCHC: 33.1 g/dL (ref 30.0–36.0)
MCV: 91.8 fl (ref 78.0–100.0)
MONOS PCT: 8 % (ref 3.0–12.0)
Monocytes Absolute: 0.4 10*3/uL (ref 0.1–1.0)
Neutro Abs: 2.5 10*3/uL (ref 1.4–7.7)
Neutrophils Relative %: 55.8 % (ref 43.0–77.0)
Platelets: 169 10*3/uL (ref 150.0–400.0)
RBC: 4.96 Mil/uL (ref 4.22–5.81)
RDW: 13.8 % (ref 11.5–15.5)
WBC: 4.4 10*3/uL (ref 4.0–10.5)

## 2017-11-06 LAB — PSA: PSA: 0.26 ng/mL (ref 0.10–4.00)

## 2017-11-06 LAB — LIPID PANEL
CHOL/HDL RATIO: 3
Cholesterol: 208 mg/dL — ABNORMAL HIGH (ref 0–200)
HDL: 68 mg/dL (ref >39.00)
LDL Cholesterol: 114 mg/dL — ABNORMAL HIGH (ref 0–99)
NONHDL: 139.74
TRIGLYCERIDES: 131 mg/dL (ref 0.0–149.0)
VLDL: 26.2 mg/dL (ref 0.0–40.0)

## 2017-11-13 ENCOUNTER — Ambulatory Visit: Payer: Medicare Other | Admitting: Internal Medicine

## 2017-11-13 ENCOUNTER — Other Ambulatory Visit: Payer: Self-pay | Admitting: Internal Medicine

## 2017-11-13 NOTE — Telephone Encounter (Signed)
Not a current Rx for patient- do you want to fill?

## 2017-11-13 NOTE — Telephone Encounter (Signed)
Copied from [place]. Topic: Quick Communication - See Telephone Encounter >> Nov 13, 2017 11:59 AM Ahmed Prima L wrote: CRM for notification. See Telephone encounter for:   11/13/17.  Refill on on his OMEPRAZOLE. Pharmacy Is [facility] on [address]

## 2017-11-15 MED ORDER — OMEPRAZOLE 40 MG PO CPDR: 40.0000 mg | DELAYED_RELEASE_CAPSULE | Freq: Every day | ORAL | 1 refills | Status: DC

## 2017-11-15 NOTE — Telephone Encounter (Signed)
Last office visit 10/20/17 Next office 11/16/16 We've never precribed

## 2017-11-16 ENCOUNTER — Encounter: Payer: Self-pay | Admitting: Internal Medicine

## 2017-11-16 ENCOUNTER — Ambulatory Visit (INDEPENDENT_AMBULATORY_CARE_PROVIDER_SITE_OTHER): Payer: Medicare Other | Admitting: Internal Medicine

## 2017-11-16 VITALS — BP 117/84 | HR 65 | Temp 98.0°F | Resp 16 | Ht 73.0 in | Wt 228.1 lb

## 2017-11-16 DIAGNOSIS — Q600 Renal agenesis, unilateral: Secondary | ICD-10-CM

## 2017-11-16 DIAGNOSIS — N3281 Overactive bladder: Secondary | ICD-10-CM

## 2017-11-16 DIAGNOSIS — Z1211 Encounter for screening for malignant neoplasm of colon: Secondary | ICD-10-CM

## 2017-11-16 DIAGNOSIS — N261 Atrophy of kidney (terminal): Secondary | ICD-10-CM

## 2017-11-16 DIAGNOSIS — N401 Enlarged prostate with lower urinary tract symptoms: Secondary | ICD-10-CM | POA: Diagnosis not present

## 2017-11-16 DIAGNOSIS — IMO0002 Reserved for concepts with insufficient information to code with codable children: Secondary | ICD-10-CM

## 2017-11-16 DIAGNOSIS — E785 Hyperlipidemia, unspecified: Secondary | ICD-10-CM | POA: Diagnosis not present

## 2017-11-16 DIAGNOSIS — I10 Essential (primary) hypertension: Secondary | ICD-10-CM | POA: Diagnosis not present

## 2017-11-16 DIAGNOSIS — R3912 Poor urinary stream: Secondary | ICD-10-CM

## 2017-11-16 MED ORDER — OXYBUTYNIN CHLORIDE ER 5 MG PO TB24: 5.0000 mg | ORAL_TABLET | Freq: Every day | ORAL | 1 refills | Status: DC

## 2017-11-16 NOTE — Patient Instructions (Addendum)
F/u in 3-4 months sooner if needed  Call me in 1 month    Overactive Bladder, Adult Overactive bladder is a group of urinary symptoms. With overactive bladder, you may suddenly feel the need to pass urine (urinate) right away. After feeling this sudden urge, you might also leak urine if you cannot get to the bathroom fast enough (urinary incontinence). These symptoms might interfere with your daily work or social activities. Overactive bladder symptoms may also wake you up at night. Overactive bladder affects the nerve signals between your bladder and your brain. Your bladder may get the signal to empty before it is full. Very sensitive muscles can also make your bladder squeeze too soon. What are the causes? Many things can cause an overactive bladder. Possible causes include:  Urinary tract infection.  Infection of nearby tissues, such as the prostate.  Prostate enlargement.  Being pregnant with twins or more (multiples).  Surgery on the uterus or urethra.  Bladder stones, inflammation, or tumors.  Drinking too much caffeine or alcohol.  Certain medicines, especially those that you take to help your body get rid of extra fluid (diuretics) by increasing urine production.  Muscle or nerve weakness, especially from: ? A spinal cord injury. ? Stroke. ? Multiple sclerosis. ? Parkinson disease.  Diabetes. This can cause a high urine volume that fills the bladder so quickly that the normal urge to urinate is triggered very strongly.  Constipation. A buildup of too much stool can put pressure on your bladder.  What increases the risk? You may be at greater risk for overactive bladder if you:  Are an older adult.  Smoke.  Are going through menopause.  Have prostate problems.  Have a neurological disease, such as stroke, dementia, Parkinson disease, or multiple sclerosis (MS).  Eat or drink things that irritate the bladder. These include alcohol, spicy food, and  caffeine.  Are overweight or obese.  What are the signs or symptoms? The signs and symptoms of an overactive bladder include:  Sudden, strong urges to urinate.  Leaking urine.  Urinating eight or more times per day.  Waking up to urinate two or more times per night.  How is this diagnosed? Your health care provider may suspect overactive bladder based on your symptoms. The health care provider will do a physical exam and take your medical history. Blood or urine tests may also be done. For example, you might need to have a bladder function test to check how well you can hold your urine. You might also need to see a health care provider who specializes in the urinary tract (urologist). How is this treated? Treatment for overactive bladder depends on the cause of your condition and whether it is mild or severe. Certain treatments can be done in your health care provider's office or clinic. You can also make lifestyle changes at home. Options include: Behavioral Treatments  Biofeedback. A specialist uses sensors to help you become aware of your body's signals.  Keeping a daily log of when you need to urinate and what happens after the urge. This may help you manage your condition.  Bladder training. This helps you learn to control the urge to urinate by following a schedule that directs you to urinate at regular intervals (timed voiding). At first, you might have to wait a few minutes after feeling the urge. In time, you should be able to schedule bathroom visits an hour or more apart.  Kegel exercises. These are exercises to strengthen the pelvic floor  muscles, which support the bladder. Toning these muscles can help you control urination, even if your bladder muscles are overactive. A specialist will teach you how to do these exercises correctly. They require daily practice.  Weight loss. If you are obese or overweight, losing weight might relieve your symptoms of overactive bladder. Talk  to your health care provider about losing weight and whether there is a specific program or method that would work best for you.  Diet change. This might help if constipation is making your overactive bladder worse. Your health care provider or a dietitian can explain ways to change what you eat to ease constipation. You might also need to consume less alcohol and caffeine or drink other fluids at different times of the day.  Stopping smoking.  Wearing pads to absorb leakage while you wait for other treatments to take effect. Physical Treatments  Electrical stimulation. Electrodes send gentle pulses of electricity to strengthen the nerves or muscles that help to control the bladder. Sometimes, the electrodes are placed outside of the body. In other cases, they might be placed inside the body (implanted). This treatment can take several months to have an effect.  Supportive devices. Women may need a plastic device that fits into the vagina and supports the bladder (pessary). Medicines Several medicines can help treat overactive bladder and are usually used along with other treatments. Some are injected into the muscles involved in urination. Others come in pill form. Your health care provider may prescribe:  Antispasmodics. These medicines block the signals that the nerves send to the bladder. This keeps the bladder from releasing urine at the wrong time.  Tricyclic antidepressants. These types of antidepressants also relax bladder muscles.  Surgery  You may have a device implanted to help manage the nerve signals that indicate when you need to urinate.  You may have surgery to implant electrodes for electrical stimulation.  Sometimes, very severe cases of overactive bladder require surgery to change the shape of the bladder. Follow these instructions at home:  Take medicines only as directed by your health care provider.  Use any implants or a pessary as directed by your health care  provider.  Make any diet or lifestyle changes that are recommended by your health care provider. These might include: ? Drinking less fluid or drinking at different times of the day. If you need to urinate often during the night, you may need to stop drinking fluids early in the evening. ? Cutting down on caffeine or alcohol. Both can make an overactive bladder worse. Caffeine is found in coffee, tea, and sodas. ? Doing Kegel exercises to strengthen muscles. ? Losing weight if you need to. ? Eating a healthy and balanced diet to prevent constipation.  Keep a journal or log to track how much and when you drink and also when you feel the need to urinate. This will help your health care provider to monitor your condition. Contact a health care provider if:  Your symptoms do not get better after treatment.  Your pain and discomfort are getting worse.  You have more frequent urges to urinate.  You have a fever. Get help right away if: You are not able to control your bladder at all. This information is not intended to replace advice given to you by your health care provider. Make sure you discuss any questions you have with your health care provider. Document Released: 08/27/2009 Document Revised: 04/07/2016 Document Reviewed: 03/26/2014 Elsevier Interactive Patient Education  [employer].  Benign Prostatic Hyperplasia Benign prostatic hyperplasia (BPH) is an enlarged prostate gland that is caused by the normal aging process and not by cancer. The prostate is a walnut-sized gland that is involved in the production of semen. It is located in front of the rectum and below the bladder. The bladder stores urine and the urethra is the tube that carries the urine out of the body. The prostate may get bigger as a man gets older. An enlarged prostate can press on the urethra. This can make it harder to pass urine. The build-up of urine in the bladder can cause infection. Back pressure and  infection may progress to bladder damage and kidney (renal) failure. What are the causes? This condition is part of a normal aging process. However, not all men develop problems from this condition. If the prostate enlarges away from the urethra, urine flow will not be blocked. If it enlarges toward the urethra and compresses it, there will be problems passing urine. What increases the risk? This condition is more likely to develop in men over the age of 58 years. What are the signs or symptoms? Symptoms of this condition include:  Getting up often during the night to urinate.  Needing to urinate frequently during the day.  Difficulty starting urine flow.  Decrease in size and strength of your urine stream.  Leaking (dribbling) after urinating.  Inability to pass urine. This needs immediate treatment.  Inability to completely empty your bladder.  Pain when you pass urine. This is more common if there is also an infection.  Urinary tract infection (UTI).  How is this diagnosed? This condition is diagnosed based on your medical history, a physical exam, and your symptoms. Tests will also be done, such as:  A post-void bladder scan. This measures any amount of urine that may remain in your bladder after you finish urinating.  A digital rectal exam. In a rectal exam, your health care provider checks your prostate by putting a lubricated, gloved finger into your rectum to feel the back of your prostate gland. This exam detects the size of your gland and any abnormal lumps or growths.  An exam of your urine (urinalysis).  A prostate specific antigen (PSA) screening. This is a blood test used to screen for prostate cancer.  An ultrasound. This test uses sound waves to electronically produce a picture of your prostate gland.  Your health care provider may refer you to a specialist in kidney and prostate diseases (urologist). How is this treated? Once symptoms begin, your health care  provider will monitor your condition (active surveillance or watchful waiting). Treatment for this condition will depend on the severity of your condition. Treatment may include:  Observation and yearly exams. This may be the only treatment needed if your condition and symptoms are mild.  Medicines to relieve your symptoms, including: ? Medicines to shrink the prostate. ? Medicines to relax the muscle of the prostate.  Surgery in severe cases. Surgery may include: ? Prostatectomy. In this procedure, the prostate tissue is removed completely through an open incision or with a laparascope or robotics. ? Transurethral resection of the prostate (TURP). In this procedure, a tool is inserted through the opening at the tip of the penis (urethra). It is used to cut away tissue of the inner core of the prostate. The pieces are removed through the same opening of the penis. This removes the blockage. ? Transurethral incision (TUIP). In this procedure, small cuts are made in the prostate.  This lessens the prostate's pressure on the urethra. ? Transurethral microwave thermotherapy (TUMT). This procedure uses microwaves to create heat. The heat destroys and removes a small amount of prostate tissue. ? Transurethral needle ablation (TUNA). This procedure uses radio frequencies to destroy and remove a small amount of prostate tissue. ? Interstitial laser coagulation ([place]). This procedure uses a laser to destroy and remove a small amount of prostate tissue. ? Transurethral electrovaporization (TUVP). This procedure uses electrodes to destroy and remove a small amount of prostate tissue. ? Prostatic urethral lift. This procedure inserts an implant to push the lobes of the prostate away from the urethra.  Follow these instructions at home:  Take over-the-counter and prescription medicines only as told by your health care provider.  Monitor your symptoms for any changes. Contact your health care provider with any  changes.  Avoid drinking large amounts of liquid before going to bed or out in public.  Avoid or reduce how much caffeine or alcohol you drink.  Give yourself time when you urinate.  Keep all follow-up visits as told by your health care provider. This is important. Contact a health care provider if:  You have unexplained back pain.  Your symptoms do not get better with treatment.  You develop side effects from the medicine you are taking.  Your urine becomes very dark or has a bad smell.  Your lower abdomen becomes distended and you have trouble passing your urine. Get help right away if:  You have a fever or chills.  You suddenly cannot urinate.  You feel lightheaded, or very dizzy, or you faint.  There are large amounts of blood or clots in the urine.  Your urinary problems become hard to manage.  You develop moderate to severe low back or flank pain. The flank is the side of your body between the ribs and the hip. These symptoms may represent a serious problem that is an emergency. Do not wait to see if the symptoms will go away. Get medical help right away. Call your local emergency services (911 in the [place] Do not drive yourself to the hospital. Summary  Benign prostatic hyperplasia (BPH) is an enlarged prostate that is caused by the normal aging process and not by cancer.  An enlarged prostate can press on the urethra. This can make it hard to pass urine.  This condition is part of a normal aging process and is more likely to develop in men over the age of 61 years.  Get help right away if you suddenly cannot urinate. This information is not intended to replace advice given to you by your health care provider. Make sure you discuss any questions you have with your health care provider. Document Released: 10/31/2005 Document Revised: 12/05/2016 Document Reviewed: 12/05/2016 Elsevier Interactive Patient Education  [employer].

## 2017-11-16 NOTE — Progress Notes (Signed)
Chief Complaint  Patient presents with  . Follow-up   F/u  1. Reviewed labs  2. BP improved he has log of BP 99-130s/60-80s since 10/21/17.  3. HLD on pravachol 20 mg qhs cant tolerate higher doses and does not want to try Zetia  4. BPH still c/o overactive bladder and urgency during the day. flomax has not really helped his sx's.  5. He is agreeable to colonoscopy today and has seen Dr. Barron Schmid GI in the past. He rather do this instead of cologuard     Review of Systems  HENT: Negative for hearing loss.   Respiratory: Negative for shortness of breath.   Cardiovascular: Negative for chest pain.  Genitourinary: Positive for frequency and urgency.       Increased urine freq and urgency during the day   Skin: Negative for rash.   Past Medical History:  Diagnosis Date  . Atrophic kidney    left   . Cataract    left eye mild   . Chicken pox   . Degenerative disc disease, lumbar    L4/5 noted imaging  . Dyslipidemia   . Emphysema of lung ([place])    COPD/bronchitis noted on imaging former smoker ([id number])  . Gallstones   . Gallstones   . GERD (gastroesophageal reflux disease)   . Granuloma of liver   . Hiatal hernia    small   . History of esophageal stricture    05/08/09  . Hyperlipidemia   . Hypertension   . Kidney stones   . Kidney stones   . LVH (left ventricular hypertrophy)    mild on imaging   . Solitary kidney    left kidney is atrophic    Past Surgical History:  Procedure Laterality Date  . FOOT SURGERY     right mortons neuroma   . PACEMAKER PLACEMENT     2015 Dr. Caryl Comes for AV block  . PERMANENT PACEMAKER INSERTION N/A 02/05/2014   Procedure: PERMANENT PACEMAKER INSERTION;  Surgeon: Deboraha Sprang, MD;  Location: [facility] CATH LAB;  Service: Cardiovascular;  Laterality: N/A;   Family History  Problem Relation Age of Onset  . Diabetes Mother        died of complications   . Cancer Father        bladder   . Stroke Father    Social History    Socioeconomic History  . Marital status: Single    Spouse name: Not on file  . Number of children: Not on file  . Years of education: Not on file  . Highest education level: Not on file  Social Needs  . Financial resource strain: Not on file  . Food insecurity - worry: Not on file  . Food insecurity - inability: Not on file  . Transportation needs - medical: Not on file  . Transportation needs - non-medical: Not on file  Occupational History  . Not on file  Tobacco Use  . Smoking status: Former Smoker    Last attempt to quit: 10/21/1991    Years since quitting: 26.0  . Smokeless tobacco: Never Used  . Tobacco comment: smoked [id number] <1 ppd no FH lung cancer   Substance and Sexual Activity  . Alcohol use: Yes    Alcohol/week: 0.0 oz    Comment: Drinks 2-3 ounces of liquor daily  . Drug use: No  . Sexual activity: Not on file  Other Topics Concern  . Not on file  Social History Narrative   Used to  be in the [employer]    2 kids (son and daughter)   Married    Born [place] grew up in [place]    Works part time at [employer] smoker 1968 to 1998 < 1ppd no FH lung cancer    Drinks 2-3 small shots of liquor daily, no drugs, never chewed tobacco    Current Meds  Medication Sig  . carvedilol (COREG) 6.25 MG tablet Take 1 tablet (6.25 mg total) by mouth 2 (two) times daily with a meal.  . losartan-hydrochlorothiazide (HYZAAR) 100-12.5 MG tablet Take 1 tablet by mouth daily.  Marland Kitchen omeprazole (PRILOSEC) 40 MG capsule Take 1 capsule (40 mg total) by mouth daily. 30 minutes before breakfast  . pravastatin (PRAVACHOL) 20 MG tablet   . tamsulosin (FLOMAX) 0.4 MG CAPS capsule Take 1 capsule (0.4 mg total) by mouth daily after supper.   Allergies  Allergen Reactions  . Lipitor [Atorvastatin]     myalgias  . Lisinopril     Cough   . Simvastatin     myalgias   Recent Results (from the past 2160 hour(s))  PSA     Status: None   Collection Time: 11/06/17  8:41 AM  Result Value  Ref Range   PSA 0.26 0.10 - 4.00 ng/mL    Comment: Test performed using Access [employer]br[employer]ch PSA Assay, a parmagnetic partical, chemiluminecent immunoassay.  TSH     Status: None   Collection Time: 11/06/17  8:41 AM  Result Value Ref Range   TSH 1.51 0.35 - 4.50 uIU/mL  Urinalysis, Routine w reflex microscopic     Status: None   Collection Time: 11/06/17  8:41 AM  Result Value Ref Range   Color, Urine YELLOW Yellow;Lt. Yellow   APPearance CLEAR Clear   Specific Gravity, Urine 1.020 1.000 - 1.030   pH 6.0 5.0 - 8.0   Total Protein, Urine NEGATIVE Negative   Urine Glucose NEGATIVE Negative   Ketones, ur NEGATIVE Negative   Bilirubin Urine NEGATIVE Negative   Hgb urine dipstick NEGATIVE Negative   Urobilinogen, UA 0.2 0.0 - 1.0   Leukocytes, UA NEGATIVE Negative   Nitrite NEGATIVE Negative   WBC, UA none seen 0-2/hpf   RBC / HPF none seen 0-2/hpf   Squamous Epithelial / LPF Rare(0-4/hpf) Rare(0-4/hpf)  Lipid panel     Status: Abnormal   Collection Time: 11/06/17  8:41 AM  Result Value Ref Range   Cholesterol 208 (H) 0 - 200 mg/dL    Comment: ATP III Classification       Desirable:  < 200 mg/dL               Borderline High:  200 - 239 mg/dL          High:  > = 240 mg/dL   Triglycerides 131.0 0.0 - 149.0 mg/dL    Comment: Normal:  <150 mg/dLBorderline High:  150 - 199 mg/dL   HDL 68.00 >39.00 mg/dL   VLDL 26.2 0.0 - 40.0 mg/dL   LDL Cholesterol 114 (H) 0 - 99 mg/dL   Total CHOL/HDL Ratio 3     Comment:                Men          Women1/2 Average Risk     3.4          3.3Average Risk          5.0          4.42X  Average Risk          9.6          7.13X Average Risk          15.0          11.0                       NonHDL 139.74     Comment: NOTE:  Non-HDL goal should be 30 mg/dL higher than patient's LDL goal (i.e. LDL goal of < 70 mg/dL, would have non-HDL goal of < 100 mg/dL)  CBC with Differential/Platelet     Status: None   Collection Time: 11/06/17  8:41 AM  Result Value Ref  Range   WBC 4.4 4.0 - 10.5 K/uL   RBC 4.96 4.22 - 5.81 Mil/uL   Hemoglobin 15.1 13.0 - 17.0 g/dL   HCT 45.5 39.0 - 52.0 %   MCV 91.8 78.0 - 100.0 fl   MCHC 33.1 30.0 - 36.0 g/dL   RDW 13.8 11.5 - 15.5 %   Platelets 169.0 150.0 - 400.0 K/uL   Neutrophils Relative % 55.8 43.0 - 77.0 %   Lymphocytes Relative 31.8 12.0 - 46.0 %   Monocytes Relative 8.0 3.0 - 12.0 %   Eosinophils Relative 3.6 0.0 - 5.0 %   Basophils Relative 0.8 0.0 - 3.0 %   Neutro Abs 2.5 1.4 - 7.7 K/uL   Lymphs Abs 1.4 0.7 - 4.0 K/uL   Monocytes Absolute 0.4 0.1 - 1.0 K/uL   Eosinophils Absolute 0.2 0.0 - 0.7 K/uL   Basophils Absolute 0.0 0.0 - 0.1 K/uL  Comprehensive metabolic panel     Status: Abnormal   Collection Time: 11/06/17  8:41 AM  Result Value Ref Range   Sodium 138 135 - 145 mEq/L   Potassium 3.8 3.5 - 5.1 mEq/L   Chloride 101 96 - 112 mEq/L   CO2 30 19 - 32 mEq/L   Glucose, Bld 110 (H) 70 - 99 mg/dL   BUN 21 6 - 23 mg/dL   Creatinine, Ser 1.20 0.40 - 1.50 mg/dL   Total Bilirubin 1.2 0.2 - 1.2 mg/dL   Alkaline Phosphatase 51 39 - 117 U/L   AST 16 0 - 37 U/L   ALT 16 0 - 53 U/L   Total Protein 6.9 6.0 - 8.3 g/dL   Albumin 4.3 3.5 - 5.2 g/dL   Calcium 9.0 8.4 - 10.5 mg/dL   GFR 63.57 >60.00 mL/min   Objective  Body mass index is 30.1 kg/m. Wt Readings from Last 3 Encounters:  11/16/17 228 lb 2 oz (103.5 kg)  10/20/17 232 lb (105.2 kg)  02/07/14 219 lb 4.3 oz (99.5 kg)   Temp Readings from Last 3 Encounters:  11/16/17 98 F (36.7 C) (Oral)  10/20/17 98 F (36.7 C) (Oral)  02/07/14 98.3 F (36.8 C) (Oral)   BP Readings from Last 3 Encounters:  11/16/17 117/84  10/20/17 (!) 132/100  02/07/14 136/89   Pulse Readings from Last 3 Encounters:  11/16/17 65  10/20/17 62  02/07/14 67   O2 sat room air 97% today   Physical Exam  Constitutional: He is oriented to person, place, and time and well-developed, well-nourished, and in no distress.  HENT:  Head: Normocephalic and atraumatic.   Eyes: Pupils are equal, round, and reactive to light.  Cardiovascular: Normal rate, regular rhythm and normal heart sounds.  Pulmonary/Chest: Effort normal and breath sounds normal.  Neurological: He is alert and  oriented to person, place, and time. Gait normal.  Skin: Skin is warm and dry.  Psychiatric: Mood, memory, affect and judgment normal.  Nursing note and vitals reviewed.   Assessment   1. HTN/HLD  2. BPH sx's with overactive bladder also see structural defects of previous CT ab/pelvis  3. HM  Plan  1. Cont meds  If sbp keeps getting low will reduce coreg to 3.125 mg bid same meds for now  Cont same dose statin cant tolerate higher doses and not interested in Zetia he will try diet and exercise   2.  Cont flomax. With bladder overactivity will try trial of Oxybutynin 5 mg XL to see if helps if does not refer urology caution with retention with anticholinergics. Also could consider Myrbetriq but did not Rx 2/2 cost of medication. Another consideration would be to Rx Flomax + Dutasteride  Pt to call back in 1 month  Declines urology for now   3.  Had flu and prevnar  pna 24 due in 1 year  rec pt had Tdap and shingrix. He wants to hold for now   PSA nl declines DRE today   Referred Dr. Watt Climes screening colonoscopy [facility]   Consider dermatology in future   Considered CT chest repeat h/o smoking and CT chest in the past mild changes COPD and chronic bronchitis (of note calculated risk and low risk) also consider repeat CT ab/pelvis with and w/o contrast or MRI (reasons CT 2015 c/w calcified granuloma w/in liver and nonspecific foci w/in liver 7-10 mm and rec f/u also noted gallstones, kidney stones, left atrophic kidney)  Provider: Dr. Olivia Mackie McLean-Scocuzza-Internal Medicine

## 2018-01-03 ENCOUNTER — Ambulatory Visit (INDEPENDENT_AMBULATORY_CARE_PROVIDER_SITE_OTHER): Payer: Medicare Other | Admitting: Orthopaedic Surgery

## 2018-01-15 ENCOUNTER — Other Ambulatory Visit: Payer: Self-pay | Admitting: Internal Medicine

## 2018-01-15 DIAGNOSIS — R3912 Poor urinary stream: Secondary | ICD-10-CM

## 2018-01-15 DIAGNOSIS — I10 Essential (primary) hypertension: Secondary | ICD-10-CM

## 2018-01-15 DIAGNOSIS — N3281 Overactive bladder: Secondary | ICD-10-CM

## 2018-01-15 DIAGNOSIS — N401 Enlarged prostate with lower urinary tract symptoms: Secondary | ICD-10-CM

## 2018-01-15 MED ORDER — CARVEDILOL 6.25 MG PO TABS: 6.2500 mg | ORAL_TABLET | Freq: Two times a day (BID) | ORAL | 1 refills | Status: DC

## 2018-01-15 MED ORDER — TAMSULOSIN HCL 0.4 MG PO CAPS: 0.4000 mg | ORAL_CAPSULE | Freq: Every day | ORAL | 0 refills | Status: DC

## 2018-01-15 MED ORDER — OXYBUTYNIN CHLORIDE ER 5 MG PO TB24: 5.0000 mg | ORAL_TABLET | Freq: Every day | ORAL | 0 refills | Status: DC

## 2018-01-18 ENCOUNTER — Ambulatory Visit (INDEPENDENT_AMBULATORY_CARE_PROVIDER_SITE_OTHER): Payer: Medicare Other | Admitting: Orthopaedic Surgery

## 2018-01-18 ENCOUNTER — Encounter (INDEPENDENT_AMBULATORY_CARE_PROVIDER_SITE_OTHER): Payer: Self-pay | Admitting: Orthopaedic Surgery

## 2018-01-18 DIAGNOSIS — M7541 Impingement syndrome of right shoulder: Secondary | ICD-10-CM | POA: Diagnosis not present

## 2018-01-18 MED ORDER — LIDOCAINE HCL 1 % IJ SOLN
3.0000 mL | INTRAMUSCULAR | Status: AC | PRN
  Administered 2018-01-18: 3 mL

## 2018-01-18 MED ORDER — METHYLPREDNISOLONE ACETATE 40 MG/ML IJ SUSP
40.0000 mg | INTRAMUSCULAR | Status: AC | PRN
  Administered 2018-01-18: 40 mg via INTRA_ARTICULAR

## 2018-01-18 NOTE — Progress Notes (Signed)
Office Visit Note   Patient: [name]           Date of Birth: 1946-06-23           MRN: [id number] Visit Date: 01/18/2018              Requested by: McLean-Scocuzza, Nino Glow, MD [place], [place] [id number] PCP: McLean-Scocuzza, Nino Glow, MD   Assessment & Plan: Visit Diagnoses:  1. Impingement syndrome of right shoulder     Plan: I agree with his request for steroid injection in his right shoulder since it is been at least 8 months or more since his last injection and that helped greatly up until just most recent.  He understands fully the risk and benefits of steroid injections and does wish to have this done today.  He tolerated it well.  All questions concerns were answered and addressed.  He will follow-up as needed otherwise.  Follow-Up Instructions: No Follow-up on file.   Orders:  No orders of the defined types were placed in this encounter.  No orders of the defined types were placed in this encounter.     Procedures: Large Joint Inj: R subacromial bursa on 01/18/2018 3:22 PM Indications: pain and diagnostic evaluation Details: 22 G 1.5 in needle  Arthrogram: No  Medications: 3 mL lidocaine 1 %; 40 mg methylPREDNISolone acetate 40 MG/ML Outcome: tolerated well, no immediate complications Procedure, treatment alternatives, risks and benefits explained, specific risks discussed. Consent was given by the patient. Immediately prior to procedure a time out was called to verify the correct patient, procedure, equipment, support staff and site/side marked as required. Patient was prepped and draped in the usual sterile fashion.       Clinical Data: No additional findings.   Subjective: Chief Complaint  Patient presents with  . Right Shoulder - Follow-up, Pain  The patient is coming in for his right shoulder.  He was doing well following her last injection in June 2018 but recently he was working on some overhead activities and moving his steering  well he felt significant discomfort in his shoulder.  He would like to have this looked at today and consider another steroid injection.  In the past with diagnosed him with impingement syndrome.  He denies any active changes in medical status or acute medical issues.  HPI  Review of Systems He currently denies any headache, chest pain, shortness of breath, fever, chills, nausea, vomiting.  Objective: Vital Signs: There were no vitals taken for this visit.  Physical Exam He is alert and oriented x3 and in no acute distress Ortho Exam Examination of his right shoulder shows full range of motion but definitely signs of impingement with a positive Neer and Hawkins signs.  His rotator cuff itself feels strong. Specialty Comments:  No specialty comments available.  Imaging: No results found.   PMFS History: Patient Active Problem List   Diagnosis Date Noted  . Overactive bladder 11/16/2017  . HTN (hypertension) 10/20/2017  . CAD (coronary artery disease) 10/20/2017  . BPH (benign prostatic hyperplasia) 10/20/2017  . Impingement syndrome of right shoulder 03/08/2017  . Solitary kidney 02/05/2014  . GERD (gastroesophageal reflux disease) 02/05/2014  . Dyslipidemia 02/05/2014  . Complete heart block ([place]) 02/05/2014  . Atrioventricular block, complete ([place]) 02/05/2014   Past Medical History:  Diagnosis Date  . Atrophic kidney    left   . Cataract    left eye mild   . Chicken pox   .  Degenerative disc disease, lumbar    L4/5 noted imaging  . Dyslipidemia   . Emphysema of lung ([place])    COPD/bronchitis noted on imaging former smoker ([id number])  . Gallstones   . Gallstones   . GERD (gastroesophageal reflux disease)   . Granuloma of liver   . Hiatal hernia    small   . History of esophageal stricture    05/08/09  . Hyperlipidemia   . Hypertension   . Kidney stones   . Kidney stones   . LVH (left ventricular hypertrophy)    mild on imaging   . Solitary kidney    left  kidney is atrophic     Family History  Problem Relation Age of Onset  . Diabetes Mother        died of complications   . Cancer Father        bladder   . Stroke Father     Past Surgical History:  Procedure Laterality Date  . FOOT SURGERY     right mortons neuroma   . PACEMAKER PLACEMENT     2015 Dr. Caryl Comes for AV block  . PERMANENT PACEMAKER INSERTION N/A 02/05/2014   Procedure: PERMANENT PACEMAKER INSERTION;  Surgeon: Deboraha Sprang, MD;  Location: [facility] CATH LAB;  Service: Cardiovascular;  Laterality: N/A;   Social History   Occupational History  . Not on file  Tobacco Use  . Smoking status: Former Smoker    Last attempt to quit: 10/21/1991    Years since quitting: 26.2  . Smokeless tobacco: Never Used  . Tobacco comment: smoked [id number] <1 ppd no FH lung cancer   Substance and Sexual Activity  . Alcohol use: Yes    Alcohol/week: 0.0 oz    Comment: Drinks 2-3 ounces of liquor daily  . Drug use: No  . Sexual activity: Not on file

## 2018-01-22 DIAGNOSIS — K5730 Diverticulosis of large intestine without perforation or abscess without bleeding: Secondary | ICD-10-CM | POA: Diagnosis not present

## 2018-01-22 DIAGNOSIS — Z1211 Encounter for screening for malignant neoplasm of colon: Secondary | ICD-10-CM | POA: Diagnosis not present

## 2018-01-22 LAB — HM COLONOSCOPY

## 2018-02-05 DIAGNOSIS — Z45018 Encounter for adjustment and management of other part of cardiac pacemaker: Secondary | ICD-10-CM | POA: Diagnosis not present

## 2018-02-05 DIAGNOSIS — Z950 Presence of cardiac pacemaker: Secondary | ICD-10-CM | POA: Diagnosis not present

## 2018-02-05 DIAGNOSIS — I495 Sick sinus syndrome: Secondary | ICD-10-CM | POA: Diagnosis not present

## 2018-03-19 ENCOUNTER — Ambulatory Visit: Payer: Medicare Other | Admitting: Internal Medicine

## 2018-03-23 ENCOUNTER — Ambulatory Visit (INDEPENDENT_AMBULATORY_CARE_PROVIDER_SITE_OTHER): Payer: Medicare Other | Admitting: Internal Medicine

## 2018-03-23 ENCOUNTER — Ambulatory Visit (INDEPENDENT_AMBULATORY_CARE_PROVIDER_SITE_OTHER): Payer: Medicare Other

## 2018-03-23 ENCOUNTER — Encounter: Payer: Self-pay | Admitting: Internal Medicine

## 2018-03-23 ENCOUNTER — Telehealth: Payer: Self-pay

## 2018-03-23 VITALS — BP 120/82 | HR 93 | Temp 98.1°F | Ht 73.0 in | Wt 224.6 lb

## 2018-03-23 VITALS — BP 120/82 | HR 93 | Temp 98.1°F | Resp 15 | Ht 73.0 in | Wt 224.0 lb

## 2018-03-23 DIAGNOSIS — Z0000 Encounter for general adult medical examination without abnormal findings: Secondary | ICD-10-CM

## 2018-03-23 DIAGNOSIS — R3912 Poor urinary stream: Secondary | ICD-10-CM | POA: Diagnosis not present

## 2018-03-23 DIAGNOSIS — D229 Melanocytic nevi, unspecified: Secondary | ICD-10-CM | POA: Diagnosis not present

## 2018-03-23 DIAGNOSIS — E785 Hyperlipidemia, unspecified: Secondary | ICD-10-CM | POA: Diagnosis not present

## 2018-03-23 DIAGNOSIS — I10 Essential (primary) hypertension: Secondary | ICD-10-CM

## 2018-03-23 DIAGNOSIS — D039 Melanoma in situ, unspecified: Secondary | ICD-10-CM | POA: Diagnosis not present

## 2018-03-23 DIAGNOSIS — N401 Enlarged prostate with lower urinary tract symptoms: Secondary | ICD-10-CM

## 2018-03-23 DIAGNOSIS — E559 Vitamin D deficiency, unspecified: Secondary | ICD-10-CM

## 2018-03-23 DIAGNOSIS — R739 Hyperglycemia, unspecified: Secondary | ICD-10-CM | POA: Diagnosis not present

## 2018-03-23 MED ORDER — TAMSULOSIN HCL 0.4 MG PO CAPS
0.8000 mg | ORAL_CAPSULE | Freq: Every day | ORAL | 1 refills | Status: DC
Start: 2018-03-23 — End: 2018-09-11

## 2018-03-23 NOTE — Progress Notes (Signed)
Subjective:   [name] is a 72 y.o. male who presents for an Initial Medicare Annual Wellness Visit.  Review of Systems  No ROS.  Medicare Wellness Visit. Additional risk factors are reflected in the social history.  Cardiac Risk Factors include: advanced age (>1men, >37 women);male gender;hypertension    Objective:    Today's Vitals   03/23/18 0858  BP: 120/82  Pulse: 93  Resp: 15  Temp: 98.1 F (36.7 C)  TempSrc: Oral  SpO2: 98%  Weight: 224 lb (101.6 kg)  Height: 6\' 1"  (1.854 m)   Body mass index is 29.55 kg/m.  Advanced Directives 03/23/2018 02/05/2014  Does Patient Have a Medical Advance Directive? No Patient does not have advance directive;Patient would like information  Would patient like information on creating a medical advance directive? No - Patient declined Advance directive packet given    Current Medications (verified) Outpatient Encounter Medications as of 03/23/2018  Medication Sig  . carvedilol (COREG) 6.25 MG tablet Take 1 tablet (6.25 mg total) by mouth 2 (two) times daily with a meal.  . losartan-hydrochlorothiazide (HYZAAR) 100-12.5 MG tablet Take 1 tablet by mouth daily.  Marland Kitchen omeprazole (PRILOSEC) 40 MG capsule Take 1 capsule (40 mg total) by mouth daily. 30 minutes before breakfast  . oxybutynin (DITROPAN-XL) 5 MG 24 hr tablet Take 1 tablet (5 mg total) by mouth at bedtime.  . pravastatin (PRAVACHOL) 20 MG tablet   . tamsulosin (FLOMAX) 0.4 MG CAPS capsule Take 2 capsules (0.8 mg total) by mouth daily after supper.   No facility-administered encounter medications on file as of 03/23/2018.     Allergies (verified) Lipitor [atorvastatin]; Lisinopril; and Simvastatin   History: Past Medical History:  Diagnosis Date  . Atrophic kidney    left   . Cataract    left eye mild   . Chicken pox   . Degenerative disc disease, lumbar    L4/5 noted imaging  . Dyslipidemia   . Emphysema of lung ([place])    COPD/bronchitis noted on imaging former  smoker ([id number])  . Gallstones   . Gallstones   . GERD (gastroesophageal reflux disease)   . Granuloma of liver   . Hiatal hernia    small   . History of esophageal stricture    05/08/09  . Hyperlipidemia   . Hypertension   . Kidney stones   . Kidney stones   . LVH (left ventricular hypertrophy)    mild on imaging   . Solitary kidney    left kidney is atrophic    Past Surgical History:  Procedure Laterality Date  . FOOT SURGERY     right mortons neuroma   . PACEMAKER PLACEMENT     2015 Dr. Caryl Comes for AV block  . PERMANENT PACEMAKER INSERTION N/A 02/05/2014   Procedure: PERMANENT PACEMAKER INSERTION;  Surgeon: Deboraha Sprang, MD;  Location: [facility] CATH LAB;  Service: Cardiovascular;  Laterality: N/A;   Family History  Problem Relation Age of Onset  . Diabetes Mother        died of complications   . Cancer Father        bladder   . Stroke Father    Social History   Socioeconomic History  . Marital status: Single    Spouse name: Not on file  . Number of children: Not on file  . Years of education: Not on file  . Highest education level: Not on file  Occupational History  . Not on file  Social Needs  .  Financial resource strain: Not hard at all  . Food insecurity:    Worry: Never true    Inability: Never true  . Transportation needs:    Medical: No    Non-medical: No  Tobacco Use  . Smoking status: Former Smoker    Last attempt to quit: 10/21/1991    Years since quitting: 26.4  . Smokeless tobacco: Never Used  . Tobacco comment: smoked [id number] <1 ppd no FH lung cancer   Substance and Sexual Activity  . Alcohol use: Yes    Alcohol/week: 0.0 oz    Comment: Drinks 2-3 ounces of liquor daily  . Drug use: No  . Sexual activity: Not on file  Lifestyle  . Physical activity:    Days per week: Not on file    Minutes per session: Not on file  . Stress: Not on file  Relationships  . Social connections:    Talks on phone: Not on file    Gets together: Not on file      Attends religious service: Not on file    Active member of club or organization: Not on file    Attends meetings of clubs or organizations: Not on file    Relationship status: Not on file  Other Topics Concern  . Not on file  Social History Narrative   Used to be in the [employer]    2 kids (son and daughter)   Married    Born [place] grew up in [place]    Works part time at [employer] smoker 1968 to 1998 < 1ppd no FH lung cancer    Drinks 2-3 small shots of liquor daily, no drugs, never chewed tobacco    Tobacco Counseling Counseling given: Not Answered Comment: smoked [id number] <1 ppd no FH lung cancer    Clinical Intake:  Pre-visit preparation completed: Yes  Pain : No/denies pain     Nutritional Status: BMI 25 -29 Overweight Diabetes: No  How often do you need to have someone help you when you read instructions, pamphlets, or other written materials from your doctor or pharmacy?: 1 - Never  Interpreter Needed?: No     Activities of Daily Living In your present state of health, do you have any difficulty performing the following activities: 03/23/2018  Hearing? N  Vision? N  Difficulty concentrating or making decisions? N  Walking or climbing stairs? N  Dressing or bathing? N  Doing errands, shopping? N  Preparing Food and eating ? N  Using the Toilet? N  In the past six months, have you accidently leaked urine? Y  Comment Followed by PCP  Do you have problems with loss of bowel control? N  Managing your Medications? N  Managing your Finances? N  Housekeeping or managing your Housekeeping? N  Some recent data might be hidden     Immunizations and Health Maintenance Immunization History  Administered Date(s) Administered  . Influenza-Unspecified 08/23/2017  . Pneumococcal Conjugate-13 10/20/2017   There are no preventive care reminders to display for this patient.  Patient Care Team: McLean-Scocuzza, Nino Glow, MD as PCP - General (Internal  Medicine)  Indicate any recent Medical Services you may have received from other than [facility] providers in the past year (date may be approximate).    Assessment:   This is a routine wellness examination for [name].  The goal of the wellness visit is to assist the patient how to close the gaps in care and create a preventative care plan for  the patient.   The roster of all physicians providing medical care to patient is listed in the Snapshot section of the chart.  Osteoporosis risk reviewed.    Safety issues reviewed; Smoke and carbon monoxide detectors in the home.Firearms locked in a safe within the home. Wears seatbelts when driving or riding with others. No violence in the home.  They do not have excessive sun exposure.  Discussed the need for sun protection: hats, long sleeves and the use of sunscreen if there is significant sun exposure.  Patient is alert, normal appearance, oriented to person/place/and time.  Correctly identified the president of the [place] and recalls of 3/3 words. Performs simple calculations and can read correct time from watch face. Displays appropriate judgement.  No new identified risk were noted.  No failures at ADL's or IADL's.   BMI- discussed the importance of a healthy diet, water intake and the benefits of aerobic exercise. Educational material provided.   24 hour diet recall: Regular diet  Dental- UTD  Eye- Visual acuity not assessed per patient preference since they have regular follow up with the ophthalmologist.  Wears corrective lenses.  Sleep patterns- Sleeps through the night without issues.  Health maintenance gaps- closed.  Patient Concerns: None at this time. Follow up with PCP as needed.  Hearing/Vision screen Hearing Screening Comments: Patient is able to hear conversational tones without difficulty.  No issues reported.   Vision Screening Comments: Wears corrective lenses Visual acuity not assessed per patient preference since  they have regular follow up with the ophthalmologist  Dietary issues and exercise activities discussed: Current Exercise Habits: Home exercise routine, Type of exercise: walking, Frequency (Times/Week): 3, Intensity: Mild  Goals    . Healthy Diet     Low cholesterol      Depression Screen PHQ 2/9 Scores 03/23/2018  PHQ - 2 Score 0    Fall Risk Fall Risk  03/23/2018  Falls in the past year? No   Cognitive Function: MMSE - Mini Mental State Exam 03/23/2018  Orientation to time 5  Orientation to Place 5  Registration 3  Attention/ Calculation 5  Recall 3  Language- name 2 objects 2  Language- repeat 1  Language- follow 3 step command 3  Language- read & follow direction 1  Write a sentence 1  Copy design 1  Total score 30        Screening Tests Health Maintenance  Topic Date Due  . Hepatitis C Screening  11/16/2018 (Originally 01/23/1946)  . TETANUS/TDAP  03/24/2019 (Originally 07/25/1965)  . INFLUENZA VACCINE  06/14/2018  . PNA vac Low Risk Adult (2 of 2 - PPSV23) 10/20/2018  . COLONOSCOPY  11/17/2027      Plan:   End of life planning; Advanced aging; Advanced directives discussed.  No HCPOA/Living Will.  Additional information declined at this time.  I have personally reviewed and noted the following in the patient's chart:   . Medical and social history . Use of alcohol, tobacco or illicit drugs  . Current medications and supplements . Functional ability and status . Nutritional status . Physical activity . Advanced directives . List of other physicians . Hospitalizations, surgeries, and ER visits in previous 12 months . Vitals . Screenings to include cognitive, depression, and falls . Referrals and appointments  In addition, I have reviewed and discussed with patient certain preventive protocols, quality metrics, and best practice recommendations. A written personalized care plan for preventive services as well as general preventive health recommendations  were provided  to patient.     Varney Biles, LPN   [id number]

## 2018-03-23 NOTE — Patient Instructions (Addendum)
  Mr. Wert , Thank you for taking time to come for your Medicare Wellness Visit. I appreciate your ongoing commitment to your health goals. Please review the following plan we discussed and let me know if I can assist you in the future.   These are the goals we discussed: Goals    . Healthy Diet     Low cholesterol       This is a list of the screening recommended for you and due dates:  Health Maintenance  Topic Date Due  .  Hepatitis C: One time screening is recommended by Center for Disease Control  (CDC) for  adults born from [birth year] through 1964.   11/16/2018*  . Tetanus Vaccine  03/24/2019*  . Flu Shot  06/14/2018  . Pneumonia vaccines (2 of 2 - PPSV23) 10/20/2018  . Colon Cancer Screening  11/17/2027  *Topic was postponed. The date shown is not the original due date.

## 2018-03-23 NOTE — Telephone Encounter (Signed)
Copied from [place] [phone]. Topic: General - Other >> Mar 23, 2018  9:35 AM Yvette Rack wrote: Reason for CRM: patient calling stating that his provider was making a referral for him to a dermatologist and that he would like to go to Dr Sarina Ser at [facility] in [place]

## 2018-03-23 NOTE — Progress Notes (Signed)
Pre visit review using our clinic review tool, if applicable. No additional management support is needed unless otherwise documented below in the visit note. 

## 2018-03-23 NOTE — Patient Instructions (Signed)
Let me know in the next 3 months about urinary symptoms  I referred you to dermatology Dr. De Burrs [facility]   Benign Prostatic Hyperplasia Benign prostatic hyperplasia (BPH) is an enlarged prostate gland that is caused by the normal aging process and not by cancer. The prostate is a walnut-sized gland that is involved in the production of semen. It is located in front of the rectum and below the bladder. The bladder stores urine and the urethra is the tube that carries the urine out of the body. The prostate may get bigger as a man gets older. An enlarged prostate can press on the urethra. This can make it harder to pass urine. The build-up of urine in the bladder can cause infection. Back pressure and infection may progress to bladder damage and kidney (renal) failure. What are the causes? This condition is part of a normal aging process. However, not all men develop problems from this condition. If the prostate enlarges away from the urethra, urine flow will not be blocked. If it enlarges toward the urethra and compresses it, there will be problems passing urine. What increases the risk? This condition is more likely to develop in men over the age of 53 years. What are the signs or symptoms? Symptoms of this condition include:  Getting up often during the night to urinate.  Needing to urinate frequently during the day.  Difficulty starting urine flow.  Decrease in size and strength of your urine stream.  Leaking (dribbling) after urinating.  Inability to pass urine. This needs immediate treatment.  Inability to completely empty your bladder.  Pain when you pass urine. This is more common if there is also an infection.  Urinary tract infection (UTI).  How is this diagnosed? This condition is diagnosed based on your medical history, a physical exam, and your symptoms. Tests will also be done, such as:  A post-void bladder scan. This measures any amount of urine that may remain in  your bladder after you finish urinating.  A digital rectal exam. In a rectal exam, your health care provider checks your prostate by putting a lubricated, gloved finger into your rectum to feel the back of your prostate gland. This exam detects the size of your gland and any abnormal lumps or growths.  An exam of your urine (urinalysis).  A prostate specific antigen (PSA) screening. This is a blood test used to screen for prostate cancer.  An ultrasound. This test uses sound waves to electronically produce a picture of your prostate gland.  Your health care provider may refer you to a specialist in kidney and prostate diseases (urologist). How is this treated? Once symptoms begin, your health care provider will monitor your condition (active surveillance or watchful waiting). Treatment for this condition will depend on the severity of your condition. Treatment may include:  Observation and yearly exams. This may be the only treatment needed if your condition and symptoms are mild.  Medicines to relieve your symptoms, including: ? Medicines to shrink the prostate. ? Medicines to relax the muscle of the prostate.  Surgery in severe cases. Surgery may include: ? Prostatectomy. In this procedure, the prostate tissue is removed completely through an open incision or with a laparascope or robotics. ? Transurethral resection of the prostate (TURP). In this procedure, a tool is inserted through the opening at the tip of the penis (urethra). It is used to cut away tissue of the inner core of the prostate. The pieces are removed through the same  opening of the penis. This removes the blockage. ? Transurethral incision (TUIP). In this procedure, small cuts are made in the prostate. This lessens the prostate's pressure on the urethra. ? Transurethral microwave thermotherapy (TUMT). This procedure uses microwaves to create heat. The heat destroys and removes a small amount of prostate  tissue. ? Transurethral needle ablation (TUNA). This procedure uses radio frequencies to destroy and remove a small amount of prostate tissue. ? Interstitial laser coagulation ([place]). This procedure uses a laser to destroy and remove a small amount of prostate tissue. ? Transurethral electrovaporization (TUVP). This procedure uses electrodes to destroy and remove a small amount of prostate tissue. ? Prostatic urethral lift. This procedure inserts an implant to push the lobes of the prostate away from the urethra.  Follow these instructions at home:  Take over-the-counter and prescription medicines only as told by your health care provider.  Monitor your symptoms for any changes. Contact your health care provider with any changes.  Avoid drinking large amounts of liquid before going to bed or out in public.  Avoid or reduce how much caffeine or alcohol you drink.  Give yourself time when you urinate.  Keep all follow-up visits as told by your health care provider. This is important. Contact a health care provider if:  You have unexplained back pain.  Your symptoms do not get better with treatment.  You develop side effects from the medicine you are taking.  Your urine becomes very dark or has a bad smell.  Your lower abdomen becomes distended and you have trouble passing your urine. Get help right away if:  You have a fever or chills.  You suddenly cannot urinate.  You feel lightheaded, or very dizzy, or you faint.  There are large amounts of blood or clots in the urine.  Your urinary problems become hard to manage.  You develop moderate to severe low back or flank pain. The flank is the side of your body between the ribs and the hip. These symptoms may represent a serious problem that is an emergency. Do not wait to see if the symptoms will go away. Get medical help right away. Call your local emergency services (911 in the [place] Do not drive yourself to the  hospital. Summary  Benign prostatic hyperplasia (BPH) is an enlarged prostate that is caused by the normal aging process and not by cancer.  An enlarged prostate can press on the urethra. This can make it hard to pass urine.  This condition is part of a normal aging process and is more likely to develop in men over the age of 40 years.  Get help right away if you suddenly cannot urinate. This information is not intended to replace advice given to you by your health care provider. Make sure you discuss any questions you have with your health care provider. Document Released: 10/31/2005 Document Revised: 12/05/2016 Document Reviewed: 12/05/2016 Elsevier Interactive Patient Education  [employer].

## 2018-03-23 NOTE — Progress Notes (Addendum)
Chief Complaint  Patient presents with  . Follow-up   F/u  1. HTN controlled  2. Still having overactive bladder flomax was helping and oxybutynin. He does not want to see urology now but will f/u in 3 months  Otherwise doing well not complaints  Review of Systems  Constitutional: Negative for weight loss.  HENT: Negative for hearing loss.   Eyes: Negative for blurred vision.  Respiratory: Negative for shortness of breath.   Cardiovascular: Negative for chest pain.  Gastrointestinal: Negative for abdominal pain.  Musculoskeletal: Negative for falls.  Skin: Negative for rash.  Neurological: Negative for headaches.  Psychiatric/Behavioral: Negative for depression.   Past Medical History:  Diagnosis Date  . Atrophic kidney    left   . Cataract    left eye mild   . Chicken pox   . Degenerative disc disease, lumbar    L4/5 noted imaging  . Dyslipidemia   . Emphysema of lung ([place])    COPD/bronchitis noted on imaging former smoker ([id number])  . Gallstones   . Gallstones   . GERD (gastroesophageal reflux disease)   . Granuloma of liver   . Hiatal hernia    small   . History of esophageal stricture    05/08/09  . Hyperlipidemia   . Hypertension   . Kidney stones   . Kidney stones   . LVH (left ventricular hypertrophy)    mild on imaging   . Solitary kidney    left kidney is atrophic    Past Surgical History:  Procedure Laterality Date  . FOOT SURGERY     right mortons neuroma   . PACEMAKER PLACEMENT     2015 Dr. Caryl Comes for AV block  . PERMANENT PACEMAKER INSERTION N/A 02/05/2014   Procedure: PERMANENT PACEMAKER INSERTION;  Surgeon: Deboraha Sprang, MD;  Location: [facility] CATH LAB;  Service: Cardiovascular;  Laterality: N/A;   Family History  Problem Relation Age of Onset  . Diabetes Mother        died of complications   . Cancer Father        bladder   . Stroke Father    Social History   Socioeconomic History  . Marital status: Single    Spouse name: Not on file   . Number of children: Not on file  . Years of education: Not on file  . Highest education level: Not on file  Occupational History  . Not on file  Social Needs  . Financial resource strain: Not on file  . Food insecurity:    Worry: Not on file    Inability: Not on file  . Transportation needs:    Medical: Not on file    Non-medical: Not on file  Tobacco Use  . Smoking status: Former Smoker    Last attempt to quit: 10/21/1991    Years since quitting: 26.4  . Smokeless tobacco: Never Used  . Tobacco comment: smoked [id number] <1 ppd no FH lung cancer   Substance and Sexual Activity  . Alcohol use: Yes    Alcohol/week: 0.0 oz    Comment: Drinks 2-3 ounces of liquor daily  . Drug use: No  . Sexual activity: Not on file  Lifestyle  . Physical activity:    Days per week: Not on file    Minutes per session: Not on file  . Stress: Not on file  Relationships  . Social connections:    Talks on phone: Not on file    Gets together: Not on file  Attends religious service: Not on file    Active member of club or organization: Not on file    Attends meetings of clubs or organizations: Not on file    Relationship status: Not on file  . Intimate partner violence:    Fear of current or ex partner: Not on file    Emotionally abused: Not on file    Physically abused: Not on file    Forced sexual activity: Not on file  Other Topics Concern  . Not on file  Social History Narrative   Used to be in the [place]    2 kids (son and daughter)   Married    Born [place] grew up in [place]    Works part time at [employer] smoker 1968 to 1998 < 1ppd no FH lung cancer    Drinks 2-3 small shots of liquor daily, no drugs, never chewed tobacco    Current Meds  Medication Sig  . carvedilol (COREG) 6.25 MG tablet Take 1 tablet (6.25 mg total) by mouth 2 (two) times daily with a meal.  . losartan-hydrochlorothiazide (HYZAAR) 100-12.5 MG tablet Take 1 tablet by mouth daily.  Marland Kitchen omeprazole  (PRILOSEC) 40 MG capsule Take 1 capsule (40 mg total) by mouth daily. 30 minutes before breakfast  . oxybutynin (DITROPAN-XL) 5 MG 24 hr tablet Take 1 tablet (5 mg total) by mouth at bedtime.  . pravastatin (PRAVACHOL) 20 MG tablet   . tamsulosin (FLOMAX) 0.4 MG CAPS capsule Take 2 capsules (0.8 mg total) by mouth daily after supper.   Allergies  Allergen Reactions  . Lipitor [Atorvastatin]     myalgias  . Lisinopril     Cough   . Simvastatin     myalgias   No results found for this or any previous visit (from the past 2160 hour(s)). Objective  Body mass index is 29.63 kg/m. Wt Readings from Last 3 Encounters:  03/23/18 224 lb 9.6 oz (101.9 kg)  11/16/17 228 lb 2 oz (103.5 kg)  10/20/17 232 lb (105.2 kg)   Temp Readings from Last 3 Encounters:  03/23/18 98.1 F (36.7 C) (Oral)  11/16/17 98 F (36.7 C) (Oral)  10/20/17 98 F (36.7 C) (Oral)   BP Readings from Last 3 Encounters:  03/23/18 120/82  11/16/17 117/84  10/20/17 (!) 132/100   Pulse Readings from Last 3 Encounters:  03/23/18 93  11/16/17 65  10/20/17 62    Physical Exam  Constitutional: He is oriented to person, place, and time. Vital signs are normal. He appears well-developed and well-nourished. He is cooperative.  HENT:  Head: Normocephalic and atraumatic.  Mouth/Throat: Oropharynx is clear and moist and mucous membranes are normal.  Eyes: Pupils are equal, round, and reactive to light. Conjunctivae are normal.  Cardiovascular: Normal rate, regular rhythm and normal heart sounds.  Pulmonary/Chest: Effort normal and breath sounds normal.  Neurological: He is alert and oriented to person, place, and time. Gait normal.  Skin: Skin is warm, dry and intact.  Multiple nevi to trunk   Psychiatric: He has a normal mood and affect. His speech is normal and behavior is normal. Judgment and thought content normal. Cognition and memory are normal.  Nursing note and vitals reviewed.   Assessment   1. HTN/HLD   2. Overactive bladder with BPH sxs 3. HM Plan   1.  Cont meds coreg 6.25 mg bid, hyzaar 100-12.5 mg qd  2. Cont flomax inc dose to 0.8 mg qd and oxybutynin  Let  me know in 3 months if not better refer to urology  3.  Had flu and prevnar  pna 23 due in 1 year 10/20/18  rec pt had Tdap (pt declines today) and shingrix given Tx today.   PSA nl declines DRE today   Referred Dr. Watt Climes screening colonoscopy [facility]  -need to get report signed release today  Consider dermatology in future due to multiple nevi referred to Dr. Kellie Moor  -pt saw Dr. Nehemiah Massed 05/01/18 MM in situ left medial chest infraclavicular no lymphadenopathy rec f/u q3 months for 1 year then q4 months for a year and q6 months x 2 years   Considered CT chest repeat h/o smoking and CT chest in the past mild changes COPD and chronic bronchitis (of note calculated risk and low risk) also consider repeat CT ab/pelvis with and w/o contrast or MRI (reasons CT 2015 c/w calcified granuloma w/in liver and nonspecific foci w/in liver 7-10 mm and rec f/u also noted gallstones, kidney stones, left atrophic kidney) -pt declined both of these on 03/23/18   Labs after 05/08/18 fasting declines MMR screening   Reviewed cardiology notes 04/02/18 Dr. Einar Gip f/u heart block dual chamber pacemaker 02/05/14 rec add zetia f/u in 1 year      Provider: Dr. Olivia Mackie McLean-Scocuzza-Internal Medicine

## 2018-03-26 ENCOUNTER — Ambulatory Visit (INDEPENDENT_AMBULATORY_CARE_PROVIDER_SITE_OTHER): Payer: Medicare Other

## 2018-03-26 ENCOUNTER — Ambulatory Visit (INDEPENDENT_AMBULATORY_CARE_PROVIDER_SITE_OTHER): Payer: Medicare Other | Admitting: Orthopaedic Surgery

## 2018-03-26 ENCOUNTER — Encounter (INDEPENDENT_AMBULATORY_CARE_PROVIDER_SITE_OTHER): Payer: Self-pay | Admitting: Orthopaedic Surgery

## 2018-03-26 DIAGNOSIS — G8929 Other chronic pain: Secondary | ICD-10-CM | POA: Diagnosis not present

## 2018-03-26 DIAGNOSIS — M1711 Unilateral primary osteoarthritis, right knee: Secondary | ICD-10-CM | POA: Diagnosis not present

## 2018-03-26 DIAGNOSIS — M25561 Pain in right knee: Secondary | ICD-10-CM

## 2018-03-26 MED ORDER — METHYLPREDNISOLONE ACETATE 40 MG/ML IJ SUSP
40.0000 mg | INTRAMUSCULAR | Status: AC | PRN
  Administered 2018-03-26: 40 mg via INTRA_ARTICULAR

## 2018-03-26 MED ORDER — LIDOCAINE HCL 1 % IJ SOLN
3.0000 mL | INTRAMUSCULAR | Status: AC | PRN
  Administered 2018-03-26: 3 mL

## 2018-03-26 NOTE — Progress Notes (Signed)
Office Visit Note   Patient: [name]           Date of Birth: 12/26/1945           MRN: [id number] Visit Date: 03/26/2018              Requested by: McLean-Scocuzza, Nino Glow, MD [place], [place] [id number] PCP: McLean-Scocuzza, Nino Glow, MD   Assessment & Plan: Visit Diagnoses:  1. Chronic pain of right knee   2. Unilateral primary osteoarthritis, right knee     Plan: He definitely has at least moderate osteoarthritis of the right knee mainly involving the medial compartment.  I talked about working on quad strengthening exercises and activity modification.  Also talked about a steroid injection in his knee as well as hyaluronic acid.  I do feel this is appropriate step to take The risk and benefits of steroid injections.  He agreed to pursue this route and proceed with a steroid injection today and then hopefully a hyaluronic acid injection 4 weeks.  All questions concerns were answered and addressed.  Follow-Up Instructions: Return in about 1 month (around 04/23/2018).   Orders:  Orders Placed This Encounter  Procedures  . Large Joint Inj  . XR Knee 1-2 Views Right   No orders of the defined types were placed in this encounter.     Procedures: Large Joint Inj: R knee on 03/26/2018 4:30 PM Indications: diagnostic evaluation and pain Details: 22 G 1.5 in needle, superolateral approach  Arthrogram: No  Medications: 3 mL lidocaine 1 %; 40 mg methylPREDNISolone acetate 40 MG/ML Outcome: tolerated well, no immediate complications Procedure, treatment alternatives, risks and benefits explained, specific risks discussed. Consent was given by the patient. Immediately prior to procedure a time out was called to verify the correct patient, procedure, equipment, support staff and site/side marked as required. Patient was prepped and draped in the usual sterile fashion.       Clinical Data: No  additional findings.   Subjective: Chief Complaint  Patient presents with  . Right Knee - Pain  The patient's mom seen before.  He comes in with about a year of worsening right knee pain with no known injury.  He denies any swelling but he has a lot of pain mostly in the mornings when he gets up.  He has a lot of pain with activities or if he is been sitting for long period time and gets up to move it hurts mainly on the medial side of his right knee.  He denies any locking catching.  He is tried activity modification and anti-inflammatories as well.  HPI  Review of Systems He currently denies any headache, chest pain, shortness of breath, fever, chills, nausea, vomiting  Objective: Vital Signs: There were no vitals taken for this visit.  Physical Exam He is alert and oriented x3 and in no acute distress Ortho Exam Examination of his right knee does show varus malalignment.  He has no effusion with full range of motion when she start flexion past 90 degrees he does have pain in the back of his knee more to the medial side.  He is negative McMurray's negative Lockman's.  He does have medial joint line tenderness on palpation. Specialty Comments:  No specialty comments available.  Imaging: Xr Knee  1-2 Views Right  Result Date: 03/26/2018 2 views of the right knee show significant medial joint space narrowing with tricompartmental osteophytes mainly at the patellofemoral joint and the medial compartment.    PMFS History: Patient Active Problem List   Diagnosis Date Noted  . HLD (hyperlipidemia) 03/23/2018  . Multiple nevi 03/23/2018  . Overactive bladder 11/16/2017  . HTN (hypertension) 10/20/2017  . CAD (coronary artery disease) 10/20/2017  . BPH (benign prostatic hyperplasia) 10/20/2017  . Impingement syndrome of right shoulder 03/08/2017  . Solitary kidney 02/05/2014  . GERD (gastroesophageal reflux disease) 02/05/2014  . Dyslipidemia 02/05/2014  . Complete heart block  ([place]) 02/05/2014  . Atrioventricular block, complete ([place]) 02/05/2014   Past Medical History:  Diagnosis Date  . Atrophic kidney    left   . Cataract    left eye mild   . Chicken pox   . Degenerative disc disease, lumbar    L4/5 noted imaging  . Dyslipidemia   . Emphysema of lung ([place])    COPD/bronchitis noted on imaging former smoker ([id number])  . Gallstones   . Gallstones   . GERD (gastroesophageal reflux disease)   . Granuloma of liver   . Hiatal hernia    small   . History of esophageal stricture    05/08/09  . Hyperlipidemia   . Hypertension   . Kidney stones   . Kidney stones   . LVH (left ventricular hypertrophy)    mild on imaging   . Solitary kidney    left kidney is atrophic     Family History  Problem Relation Age of Onset  . Diabetes Mother        died of complications   . Cancer Father        bladder   . Stroke Father     Past Surgical History:  Procedure Laterality Date  . FOOT SURGERY     right mortons neuroma   . PACEMAKER PLACEMENT     2015 Dr. Caryl Comes for AV block  . PERMANENT PACEMAKER INSERTION N/A 02/05/2014   Procedure: PERMANENT PACEMAKER INSERTION;  Surgeon: Deboraha Sprang, MD;  Location: [facility] CATH LAB;  Service: Cardiovascular;  Laterality: N/A;   Social History   Occupational History  . Not on file  Tobacco Use  . Smoking status: Former Smoker    Last attempt to quit: 10/21/1991    Years since quitting: 26.4  . Smokeless tobacco: Never Used  . Tobacco comment: smoked [id number] <1 ppd no FH lung cancer   Substance and Sexual Activity  . Alcohol use: Yes    Alcohol/week: 0.0 oz    Comment: Drinks 2-3 ounces of liquor daily  . Drug use: No  . Sexual activity: Not on file

## 2018-04-02 DIAGNOSIS — Z136 Encounter for screening for cardiovascular disorders: Secondary | ICD-10-CM | POA: Diagnosis not present

## 2018-04-02 DIAGNOSIS — I10 Essential (primary) hypertension: Secondary | ICD-10-CM | POA: Diagnosis not present

## 2018-04-02 DIAGNOSIS — Z950 Presence of cardiac pacemaker: Secondary | ICD-10-CM | POA: Diagnosis not present

## 2018-04-02 DIAGNOSIS — Z45018 Encounter for adjustment and management of other part of cardiac pacemaker: Secondary | ICD-10-CM | POA: Diagnosis not present

## 2018-04-02 DIAGNOSIS — E7800 Pure hypercholesterolemia, unspecified: Secondary | ICD-10-CM | POA: Diagnosis not present

## 2018-04-03 ENCOUNTER — Telehealth (INDEPENDENT_AMBULATORY_CARE_PROVIDER_SITE_OTHER): Payer: Self-pay

## 2018-04-03 NOTE — Telephone Encounter (Signed)
Submitted application online for Monovisc injection, right knee. 

## 2018-04-10 ENCOUNTER — Encounter: Payer: Self-pay | Admitting: Internal Medicine

## 2018-04-10 NOTE — Progress Notes (Signed)
Colonoscopy 01/22/18 diverticulosis repeat in 10 years Dr. Watt Climes

## 2018-04-18 ENCOUNTER — Telehealth: Payer: Self-pay | Admitting: Internal Medicine

## 2018-04-18 DIAGNOSIS — N3281 Overactive bladder: Secondary | ICD-10-CM

## 2018-04-18 NOTE — Telephone Encounter (Signed)
Copied from [place] [phone]. Topic: Quick Communication - See Telephone Encounter >> Apr 18, 2018 12:53 PM Ivar Drape wrote: CRM for notification. See Telephone encounter for: 04/18/18. Patient would like a refill on his oxybutynin (DITROPAN-XL) 5 MG 24 hr tablet medication and have it sent to his preferred pharmacy [facility] on [address]  [facility].  Patient would like a 90day supply.

## 2018-04-19 MED ORDER — OXYBUTYNIN CHLORIDE ER 5 MG PO TB24: 5.0000 mg | ORAL_TABLET | Freq: Every day | ORAL | 1 refills | Status: DC

## 2018-04-23 DIAGNOSIS — Z1283 Encounter for screening for malignant neoplasm of skin: Secondary | ICD-10-CM | POA: Diagnosis not present

## 2018-04-23 DIAGNOSIS — D0359 Melanoma in situ of other part of trunk: Secondary | ICD-10-CM | POA: Diagnosis not present

## 2018-04-23 DIAGNOSIS — D225 Melanocytic nevi of trunk: Secondary | ICD-10-CM | POA: Diagnosis not present

## 2018-04-23 DIAGNOSIS — D229 Melanocytic nevi, unspecified: Secondary | ICD-10-CM | POA: Diagnosis not present

## 2018-04-23 DIAGNOSIS — L821 Other seborrheic keratosis: Secondary | ICD-10-CM | POA: Diagnosis not present

## 2018-04-23 DIAGNOSIS — L918 Other hypertrophic disorders of the skin: Secondary | ICD-10-CM | POA: Diagnosis not present

## 2018-04-23 DIAGNOSIS — D1800 Hemangioma unspecified site: Secondary | ICD-10-CM | POA: Diagnosis not present

## 2018-04-23 DIAGNOSIS — L578 Other skin changes due to chronic exposure to nonionizing radiation: Secondary | ICD-10-CM | POA: Diagnosis not present

## 2018-04-23 DIAGNOSIS — D485 Neoplasm of uncertain behavior of skin: Secondary | ICD-10-CM | POA: Diagnosis not present

## 2018-04-23 DIAGNOSIS — L812 Freckles: Secondary | ICD-10-CM | POA: Diagnosis not present

## 2018-05-01 DIAGNOSIS — D039 Melanoma in situ, unspecified: Secondary | ICD-10-CM | POA: Insufficient documentation

## 2018-05-01 DIAGNOSIS — D0359 Melanoma in situ of other part of trunk: Secondary | ICD-10-CM | POA: Diagnosis not present

## 2018-05-02 ENCOUNTER — Ambulatory Visit (INDEPENDENT_AMBULATORY_CARE_PROVIDER_SITE_OTHER): Payer: Medicare Other | Admitting: Orthopaedic Surgery

## 2018-05-03 ENCOUNTER — Ambulatory Visit (INDEPENDENT_AMBULATORY_CARE_PROVIDER_SITE_OTHER): Payer: Medicare Other | Admitting: Orthopaedic Surgery

## 2018-05-03 ENCOUNTER — Encounter (INDEPENDENT_AMBULATORY_CARE_PROVIDER_SITE_OTHER): Payer: Self-pay | Admitting: Orthopaedic Surgery

## 2018-05-03 DIAGNOSIS — G8929 Other chronic pain: Secondary | ICD-10-CM

## 2018-05-03 DIAGNOSIS — M25561 Pain in right knee: Secondary | ICD-10-CM

## 2018-05-03 DIAGNOSIS — M1711 Unilateral primary osteoarthritis, right knee: Secondary | ICD-10-CM | POA: Diagnosis not present

## 2018-05-03 MED ORDER — HYALURONAN 88 MG/4ML IX SOSY
88.0000 mg | PREFILLED_SYRINGE | INTRA_ARTICULAR | Status: AC | PRN
  Administered 2018-05-03: 88 mg via INTRA_ARTICULAR

## 2018-05-03 MED ORDER — LIDOCAINE HCL 1 % IJ SOLN
3.0000 mL | INTRAMUSCULAR | Status: AC | PRN
  Administered 2018-05-03: 3 mL

## 2018-05-03 NOTE — Progress Notes (Signed)
   Procedure Note  Patient: [name]             Date of Birth: 10-26-1946           MRN: [id number]             Visit Date: 05/03/2018  Procedures: Visit Diagnoses: Unilateral primary osteoarthritis, right knee  Chronic pain of right knee  Large Joint Inj: R knee on 05/03/2018 3:55 PM Indications: diagnostic evaluation and pain Details: 22 G 1.5 in needle, superolateral approach  Arthrogram: No  Medications: 3 mL lidocaine 1 %; 88 mg Hyaluronan 88 MG/4ML Outcome: tolerated well, no immediate complications Procedure, treatment alternatives, risks and benefits explained, specific risks discussed. Consent was given by the patient. Immediately prior to procedure a time out was called to verify the correct patient, procedure, equipment, support staff and site/side marked as required. Patient was prepped and draped in the usual sterile fashion.     The patient is here today for scheduled hyaluronic acid injection in his right knee with Monovisc.  He has knee pain and moderate osteoarthritis is led to this pain.  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  We placed a steroid injection in his knee last month he said that helped greatly.  He is looking forward to hopefully having a hyaluronic acid injection provide an environment will help his knee in the long-term.  He understands fully the reasoning behind injection such as this as well as the risk and benefits involved.  He tolerated the Monovisc injection well in his right knee without difficulty.  All questions concerns were answered and addressed.  Follow-up will be as needed.  He knows he can always get a steroid injection in 3 to 6 months now if needed and another hyaluronic acid injection later if needed.

## 2018-05-07 DIAGNOSIS — I442 Atrioventricular block, complete: Secondary | ICD-10-CM | POA: Diagnosis not present

## 2018-05-07 DIAGNOSIS — Z45018 Encounter for adjustment and management of other part of cardiac pacemaker: Secondary | ICD-10-CM | POA: Diagnosis not present

## 2018-05-07 DIAGNOSIS — Z950 Presence of cardiac pacemaker: Secondary | ICD-10-CM | POA: Diagnosis not present

## 2018-05-11 ENCOUNTER — Other Ambulatory Visit (INDEPENDENT_AMBULATORY_CARE_PROVIDER_SITE_OTHER): Payer: Medicare Other

## 2018-05-11 DIAGNOSIS — R739 Hyperglycemia, unspecified: Secondary | ICD-10-CM | POA: Diagnosis not present

## 2018-05-11 DIAGNOSIS — E559 Vitamin D deficiency, unspecified: Secondary | ICD-10-CM

## 2018-05-11 DIAGNOSIS — I10 Essential (primary) hypertension: Secondary | ICD-10-CM

## 2018-05-11 DIAGNOSIS — E785 Hyperlipidemia, unspecified: Secondary | ICD-10-CM

## 2018-05-11 LAB — BASIC METABOLIC PANEL
BUN: 13 mg/dL (ref 6–23)
CALCIUM: 9.1 mg/dL (ref 8.4–10.5)
CO2: 27 mEq/L (ref 19–32)
CREATININE: 1.09 mg/dL (ref 0.40–1.50)
Chloride: 107 mEq/L (ref 96–112)
GFR: 70.92 mL/min (ref >60.00)
Glucose, Bld: 141 mg/dL — ABNORMAL HIGH (ref 70–99)
Potassium: 4.2 mEq/L (ref 3.5–5.1)
SODIUM: 141 meq/L (ref 135–145)

## 2018-05-11 LAB — LIPID PANEL
CHOL/HDL RATIO: 4
CHOLESTEROL: 219 mg/dL — AB (ref 0–200)
HDL: 61.3 mg/dL (ref >39.00)
LDL CALC: 144 mg/dL — AB (ref 0–99)
NonHDL: 157.67
TRIGLYCERIDES: 69 mg/dL (ref 0.0–149.0)
VLDL: 13.8 mg/dL (ref 0.0–40.0)

## 2018-05-11 LAB — VITAMIN D 25 HYDROXY (VIT D DEFICIENCY, FRACTURES): VITD: 33.53 ng/mL (ref 30.00–100.00)

## 2018-05-11 LAB — HEMOGLOBIN A1C: Hgb A1c MFr Bld: 5.6 % (ref 4.6–6.5)

## 2018-05-15 ENCOUNTER — Other Ambulatory Visit: Payer: Self-pay | Admitting: Internal Medicine

## 2018-05-15 DIAGNOSIS — E785 Hyperlipidemia, unspecified: Secondary | ICD-10-CM

## 2018-05-15 MED ORDER — EZETIMIBE 10 MG PO TABS: 10.0000 mg | ORAL_TABLET | Freq: Every day | ORAL | 3 refills | Status: DC

## 2018-05-21 DIAGNOSIS — H2513 Age-related nuclear cataract, bilateral: Secondary | ICD-10-CM | POA: Diagnosis not present

## 2018-05-21 DIAGNOSIS — H524 Presbyopia: Secondary | ICD-10-CM | POA: Diagnosis not present

## 2018-06-05 ENCOUNTER — Telehealth: Payer: Self-pay | Admitting: Internal Medicine

## 2018-06-05 ENCOUNTER — Other Ambulatory Visit: Payer: Self-pay

## 2018-06-05 DIAGNOSIS — D0359 Melanoma in situ of other part of trunk: Secondary | ICD-10-CM | POA: Diagnosis not present

## 2018-06-05 DIAGNOSIS — D225 Melanocytic nevi of trunk: Secondary | ICD-10-CM | POA: Diagnosis not present

## 2018-06-05 MED ORDER — OMEPRAZOLE 40 MG PO CPDR: 40.0000 mg | DELAYED_RELEASE_CAPSULE | Freq: Every day | ORAL | 1 refills | Status: DC

## 2018-06-05 NOTE — Telephone Encounter (Signed)
Copied from [place] [phone]. Topic: Quick Communication - See Telephone Encounter >> Jun 05, 2018  8:57 AM Conception Chancy, NT wrote: CRM for notification. See Telephone encounter for: 06/05/18.  Patient is calling and is needing a refill on omeprazole (PRILOSEC) 40 MG capsule and pravastatin (PRAVACHOL) 20 MG tablet. Patient states requested these from the pharmacy beginning of last week. I do not see where we received a request, patient states he is out of both medications. Please advise.   [id number] [address] [place] [address] Phone: [phone] Fax: [phone]

## 2018-06-05 NOTE — Telephone Encounter (Signed)
prilosec refilled Pravastatin filled by historic provider on 08-23-17 Last seen on 03-23-18  rx request

## 2018-06-06 ENCOUNTER — Telehealth: Payer: Self-pay | Admitting: Internal Medicine

## 2018-06-06 NOTE — Telephone Encounter (Unsigned)
Copied from [place] [phone]. Topic: Quick Communication - See Telephone Encounter >> Jun 05, 2018  8:57 AM Conception Chancy, NT wrote: CRM for notification. See Telephone encounter for: 06/05/18.  Patient is calling and is needing a refill on omeprazole (PRILOSEC) 40 MG capsule and pravastatin (PRAVACHOL) 20 MG tablet. Patient states requested these from the pharmacy beginning of last week. I do not see where we received a request, patient states he is out of both medications. Please advise.   [id number] [address] [place] [address] Phone: [phone] Fax: [phone]

## 2018-06-06 NOTE — Telephone Encounter (Signed)
[facility] [address] On 06-05-18

## 2018-06-12 DIAGNOSIS — D0359 Melanoma in situ of other part of trunk: Secondary | ICD-10-CM | POA: Diagnosis not present

## 2018-06-14 ENCOUNTER — Other Ambulatory Visit: Payer: Self-pay | Admitting: Internal Medicine

## 2018-06-14 ENCOUNTER — Other Ambulatory Visit: Payer: Self-pay | Admitting: *Deleted

## 2018-06-14 DIAGNOSIS — E785 Hyperlipidemia, unspecified: Secondary | ICD-10-CM

## 2018-06-14 DIAGNOSIS — I10 Essential (primary) hypertension: Secondary | ICD-10-CM

## 2018-06-14 DIAGNOSIS — I2510 Atherosclerotic heart disease of native coronary artery without angina pectoris: Secondary | ICD-10-CM

## 2018-06-14 MED ORDER — PRAVASTATIN SODIUM 20 MG PO TABS: 20.0000 mg | ORAL_TABLET | Freq: Every day | ORAL | 1 refills | Status: DC

## 2018-06-14 MED ORDER — PRAVASTATIN SODIUM 20 MG PO TABS: 20.0000 mg | ORAL_TABLET | Freq: Every day | ORAL | 3 refills | Status: DC

## 2018-06-14 MED ORDER — CARVEDILOL 6.25 MG PO TABS: 6.2500 mg | ORAL_TABLET | Freq: Two times a day (BID) | ORAL | 3 refills | Status: DC

## 2018-06-14 NOTE — Telephone Encounter (Signed)
This had refills not sure what is going on but resent    [place]

## 2018-06-14 NOTE — Telephone Encounter (Signed)
Pt came in to check on the status of the Pravastatin

## 2018-06-14 NOTE — Progress Notes (Signed)
Pravastatin filled per PCP lab note dated 05/15/18 take both medications pravastatin and Zetia.

## 2018-08-06 DIAGNOSIS — Z45018 Encounter for adjustment and management of other part of cardiac pacemaker: Secondary | ICD-10-CM | POA: Diagnosis not present

## 2018-08-06 DIAGNOSIS — Z950 Presence of cardiac pacemaker: Secondary | ICD-10-CM | POA: Diagnosis not present

## 2018-08-06 DIAGNOSIS — I442 Atrioventricular block, complete: Secondary | ICD-10-CM | POA: Diagnosis not present

## 2018-09-11 ENCOUNTER — Other Ambulatory Visit: Payer: Self-pay | Admitting: Internal Medicine

## 2018-09-11 DIAGNOSIS — R3912 Poor urinary stream: Principal | ICD-10-CM

## 2018-09-11 DIAGNOSIS — N401 Enlarged prostate with lower urinary tract symptoms: Secondary | ICD-10-CM

## 2018-09-11 MED ORDER — TAMSULOSIN HCL 0.4 MG PO CAPS: 0.8000 mg | ORAL_CAPSULE | Freq: Every day | ORAL | 1 refills | Status: DC

## 2018-09-24 ENCOUNTER — Ambulatory Visit: Payer: Medicare Other | Admitting: Internal Medicine

## 2018-09-28 ENCOUNTER — Ambulatory Visit (INDEPENDENT_AMBULATORY_CARE_PROVIDER_SITE_OTHER): Payer: Medicare Other | Admitting: Internal Medicine

## 2018-09-28 ENCOUNTER — Encounter: Payer: Self-pay | Admitting: Internal Medicine

## 2018-09-28 VITALS — BP 122/80 | HR 87 | Temp 98.1°F | Ht 73.0 in | Wt 225.0 lb

## 2018-09-28 DIAGNOSIS — Z1389 Encounter for screening for other disorder: Secondary | ICD-10-CM | POA: Diagnosis not present

## 2018-09-28 DIAGNOSIS — M1990 Unspecified osteoarthritis, unspecified site: Secondary | ICD-10-CM | POA: Diagnosis not present

## 2018-09-28 DIAGNOSIS — Z1329 Encounter for screening for other suspected endocrine disorder: Secondary | ICD-10-CM

## 2018-09-28 DIAGNOSIS — N3281 Overactive bladder: Secondary | ICD-10-CM | POA: Diagnosis not present

## 2018-09-28 DIAGNOSIS — I10 Essential (primary) hypertension: Secondary | ICD-10-CM

## 2018-09-28 DIAGNOSIS — R3912 Poor urinary stream: Secondary | ICD-10-CM

## 2018-09-28 DIAGNOSIS — E785 Hyperlipidemia, unspecified: Secondary | ICD-10-CM | POA: Diagnosis not present

## 2018-09-28 DIAGNOSIS — Z23 Encounter for immunization: Secondary | ICD-10-CM

## 2018-09-28 DIAGNOSIS — N401 Enlarged prostate with lower urinary tract symptoms: Secondary | ICD-10-CM

## 2018-09-28 DIAGNOSIS — Z125 Encounter for screening for malignant neoplasm of prostate: Secondary | ICD-10-CM | POA: Diagnosis not present

## 2018-09-28 DIAGNOSIS — I2510 Atherosclerotic heart disease of native coronary artery without angina pectoris: Secondary | ICD-10-CM | POA: Diagnosis not present

## 2018-09-28 NOTE — Patient Instructions (Addendum)
Tylenol 500 mg up to 6 pills per day  Shingrix vaccine 2 doses 2nd dose <6 months of 1st  -consider [facility]   Fasting labs after 11/06/18  No food only water and meds for 8-12 hrs I prefer 12   Pneumococcal Polysaccharide Vaccine: What You Need to Know 1. Why get vaccinated? Vaccination can protect older adults (and some children and younger adults) from pneumococcal disease. Pneumococcal disease is caused by bacteria that can spread from person to person through close contact. It can cause ear infections, and it can also lead to more serious infections of the:  Lungs (pneumonia),  Blood (bacteremia), and  Covering of the brain and spinal cord (meningitis). Meningitis can cause deafness and brain damage, and it can be fatal.  Anyone can get pneumococcal disease, but children under 36 years of age, people with certain medical conditions, adults over 91 years of age, and cigarette smokers are at the highest risk. About 18,000 older adults die each year from pneumococcal disease in the [place]. Treatment of pneumococcal infections with penicillin and other drugs used to be more effective. But some strains of the disease have become resistant to these drugs. This makes prevention of the disease, through vaccination, even more important. 2. Pneumococcal polysaccharide vaccine (PPSV23) Pneumococcal polysaccharide vaccine (PPSV23) protects against 23 types of pneumococcal bacteria. It will not prevent all pneumococcal disease. PPSV23 is recommended for:  All adults 50 years of age and older,  Anyone 2 through 71 years of age with certain long-term health problems,  Anyone 2 through 71 years of age with a weakened immune system,  Adults 44 through 71 years of age who smoke cigarettes or have asthma.  Most people need only one dose of PPSV. A second dose is recommended for certain high-risk groups. People 13 and older should get a dose even if they have gotten one or more doses of the  vaccine before they turned 65. Your healthcare provider can give you more information about these recommendations. Most healthy adults develop protection within 2 to 3 weeks of getting the shot. 3. Some people should not get this vaccine  Anyone who has had a life-threatening allergic reaction to PPSV should not get another dose.  Anyone who has a severe allergy to any component of PPSV should not receive it. Tell your provider if you have any severe allergies.  Anyone who is moderately or severely ill when the shot is scheduled may be asked to wait until they recover before getting the vaccine. Someone with a mild illness can usually be vaccinated.  Children less than 61 years of age should not receive this vaccine.  There is no evidence that PPSV is harmful to either a pregnant woman or to her fetus. However, as a precaution, women who need the vaccine should be vaccinated before becoming pregnant, if possible. 4. Risks of a vaccine reaction With any medicine, including vaccines, there is a chance of side effects. These are usually mild and go away on their own, but serious reactions are also possible. About half of people who get PPSV have mild side effects, such as redness or pain where the shot is given, which go away within about two days. Less than 1 out of 100 people develop a fever, muscle aches, or more severe local reactions. Problems that could happen after any vaccine:  People sometimes faint after a medical procedure, including vaccination. Sitting or lying down for about 15 minutes can help prevent fainting, and injuries caused by  a fall. Tell your doctor if you feel dizzy, or have vision changes or ringing in the ears.  Some people get severe pain in the shoulder and have difficulty moving the arm where a shot was given. This happens very rarely.  Any medication can cause a severe allergic reaction. Such reactions from a vaccine are very rare, estimated at about 1 in a million  doses, and would happen within a few minutes to a few hours after the vaccination. As with any medicine, there is a very remote chance of a vaccine causing a serious injury or death. The safety of vaccines is always being monitored. For more information, visit: http://www.aguilar.org/ 5. What if there is a serious reaction? What should I look for? Look for anything that concerns you, such as signs of a severe allergic reaction, very high fever, or unusual behavior. Signs of a severe allergic reaction can include hives, swelling of the face and throat, difficulty breathing, a fast heartbeat, dizziness, and weakness. These would usually start a few minutes to a few hours after the vaccination. What should I do? If you think it is a severe allergic reaction or other emergency that can't wait, call 9-1-1 or get to the nearest hospital. Otherwise, call your doctor. Afterward, the reaction should be reported to the Vaccine Adverse Event Reporting System (VAERS). Your doctor might file this report, or you can do it yourself through the VAERS web site at www.vaers.SamedayNews.es, or by calling (905)469-3580. VAERS does not give medical advice. 6. How can I learn more?  Ask your doctor. He or she can give you the vaccine package insert or suggest other sources of information.  Call your local or state health department.  Contact the Centers for Disease Control and Prevention (CDC): ? Call (623) 695-2214 (1-800-CDC-INFO) or ? Visit CDC's website at http://hunter.com/ CDC Pneumococcal Polysaccharide Vaccine VIS (03/07/14) This information is not intended to replace advice given to you by your health care provider. Make sure you discuss any questions you have with your health care provider. Document Released: 08/28/2006 Document Revised: 07/21/2016 Document Reviewed: 07/21/2016 Elsevier Interactive Patient Education  2017 [place] is a term that is commonly used to refer to  joint pain or joint disease. There are more than 100 types of arthritis. What are the causes? The most common cause of this condition is wear and tear of a joint. Other causes include:  Gout.  Inflammation of a joint.  An infection of a joint.  Sprains and other injuries near the joint.  A drug reaction or allergic reaction.  In some cases, the cause may not be known. What are the signs or symptoms? The main symptom of this condition is pain in the joint with movement. Other symptoms include:  Redness, swelling, or stiffness at a joint.  Warmth coming from the joint.  Fever.  Overall feeling of illness.  How is this diagnosed? This condition may be diagnosed with a physical exam and tests, including:  Blood tests.  Urine tests.  Imaging tests, such as MRI, X-rays, or a CT scan.  Sometimes, fluid is removed from a joint for testing. How is this treated? Treatment for this condition may involve:  Treatment of the cause, if it is known.  Rest.  Raising (elevating) the joint.  Applying cold or hot packs to the joint.  Medicines to improve symptoms and reduce inflammation.  Injections of a steroid such as cortisone into the joint to help reduce pain and inflammation.  Depending on the cause of your arthritis, you may need to make lifestyle changes to reduce stress on your joint. These changes may include exercising more and losing weight. Follow these instructions at home: Medicines  Take over-the-counter and prescription medicines only as told by your health care provider.  Do not take aspirin to relieve pain if gout is suspected. Activity  Rest your joint if told by your health care provider. Rest is important when your disease is active and your joint feels painful, swollen, or stiff.  Avoid activities that make the pain worse. It is important to balance activity with rest.  Exercise your joint regularly with range-of-motion exercises as told by your  health care provider. Try doing low-impact exercise, such as: ? Swimming. ? Water aerobics. ? Biking. ? Walking. Joint Care   If your joint is swollen, keep it elevated if told by your health care provider.  If your joint feels stiff in the morning, try taking a warm shower.  If directed, apply heat to the joint. If you have diabetes, do not apply heat without permission from your health care provider. ? Put a towel between the joint and the hot pack or heating pad. ? Leave the heat on the area for 20-30 minutes.  If directed, apply ice to the joint: ? Put ice in a plastic bag. ? Place a towel between your skin and the bag. ? Leave the ice on for 20 minutes, 2-3 times per day.  Keep all follow-up visits as told by your health care provider. This is important. Contact a health care provider if:  The pain gets worse.  You have a fever. Get help right away if:  You develop severe joint pain, swelling, or redness.  Many joints become painful and swollen.  You develop severe back pain.  You develop severe weakness in your leg.  You cannot control your bladder or bowels. This information is not intended to replace advice given to you by your health care provider. Make sure you discuss any questions you have with your health care provider. Document Released: 12/08/2004 Document Revised: 04/07/2016 Document Reviewed: 01/26/2015 Elsevier Interactive Patient Education  [employer].

## 2018-09-28 NOTE — Progress Notes (Signed)
Pre visit review using our clinic review tool, if applicable. No additional management support is needed unless otherwise documented below in the visit note. 

## 2018-09-28 NOTE — Progress Notes (Signed)
Chief Complaint  Patient presents with  . Follow-up   F/u  1. HTN controlled on coreg 6.25 bid pt stopped losartan-hctz 100-12.5 2. Overactive bladder and trouble with urinating improved on flomax and oxybutynin does not want to see urology for now and will let know if worse  3. HLD, CAD will repeat lipid on zetia tolerating  4. Right finger dip pain intermittently and stiffness likely arthritis no sig pain today   Review of Systems  Constitutional: Negative for weight loss.  HENT: Negative for hearing loss.   Eyes: Negative for blurred vision.  Respiratory: Negative for shortness of breath.   Cardiovascular: Negative for chest pain.  Gastrointestinal: Negative for abdominal pain.  Genitourinary:       Improved overactive bladder and urination    Musculoskeletal: Negative for falls.  Skin: Negative for rash.  Neurological: Negative for headaches.  Psychiatric/Behavioral: Negative for depression.   Past Medical History:  Diagnosis Date  . Atrophic kidney    left   . Cataract    left eye mild   . Chicken pox   . Degenerative disc disease, lumbar    L4/5 noted imaging  . Dyslipidemia   . Emphysema of lung ([place])    COPD/bronchitis noted on imaging former smoker ([id number])  . Gallstones   . Gallstones   . GERD (gastroesophageal reflux disease)   . Granuloma of liver   . Hiatal hernia    small   . History of esophageal stricture    05/08/09  . Hyperlipidemia   . Hypertension   . Kidney stones   . Kidney stones   . LVH (left ventricular hypertrophy)    mild on imaging   . Solitary kidney    left kidney is atrophic    Past Surgical History:  Procedure Laterality Date  . FOOT SURGERY     right mortons neuroma   . PACEMAKER PLACEMENT     2015 Dr. Caryl Comes for AV block  . PERMANENT PACEMAKER INSERTION N/A 02/05/2014   Procedure: PERMANENT PACEMAKER INSERTION;  Surgeon: Deboraha Sprang, MD;  Location: [facility] CATH LAB;  Service: Cardiovascular;  Laterality: N/A;   Family  History  Problem Relation Age of Onset  . Diabetes Mother        died of complications   . Cancer Father        bladder   . Stroke Father    Social History   Socioeconomic History  . Marital status: Single    Spouse name: Not on file  . Number of children: Not on file  . Years of education: Not on file  . Highest education level: Not on file  Occupational History  . Not on file  Social Needs  . Financial resource strain: Not hard at all  . Food insecurity:    Worry: Never true    Inability: Never true  . Transportation needs:    Medical: No    Non-medical: No  Tobacco Use  . Smoking status: Former Smoker    Last attempt to quit: 10/21/1991    Years since quitting: 26.9  . Smokeless tobacco: Never Used  . Tobacco comment: smoked [id number] <1 ppd no FH lung cancer   Substance and Sexual Activity  . Alcohol use: Yes    Comment: Drinks 2-3 ounces of liquor daily  . Drug use: No  . Sexual activity: Not on file  Lifestyle  . Physical activity:    Days per week: Not on file    Minutes per  session: Not on file  . Stress: Not on file  Relationships  . Social connections:    Talks on phone: Not on file    Gets together: Not on file    Attends religious service: Not on file    Active member of club or organization: Not on file    Attends meetings of clubs or organizations: Not on file    Relationship status: Not on file  . Intimate partner violence:    Fear of current or ex partner: No    Emotionally abused: No    Physically abused: No    Forced sexual activity: No  Other Topics Concern  . Not on file  Social History Narrative   Used to be in the [employer]    2 kids (son and daughter)   Married    Born [place] grew up in [place]    Works part time at [employer] smoker 1968 to 1998 < 1ppd no FH lung cancer    Drinks 2-3 small shots of liquor daily, no drugs, never chewed tobacco    Current Meds  Medication Sig  . carvedilol (COREG) 6.25 MG tablet Take 1 tablet  (6.25 mg total) by mouth 2 (two) times daily with a meal.  . ezetimibe (ZETIA) 10 MG tablet Take 1 tablet (10 mg total) by mouth daily. Continue to Take pravastatin in the nighttime  . omeprazole (PRILOSEC) 40 MG capsule Take 1 capsule (40 mg total) by mouth daily. 30 minutes before breakfast  . oxybutynin (DITROPAN-XL) 5 MG 24 hr tablet Take 1 tablet (5 mg total) by mouth at bedtime.  . pravastatin (PRAVACHOL) 20 MG tablet Take 1 tablet (20 mg total) by mouth daily. At night  . tamsulosin (FLOMAX) 0.4 MG CAPS capsule Take 2 capsules (0.8 mg total) by mouth daily after supper.   Allergies  Allergen Reactions  . Lipitor [Atorvastatin]     myalgias  . Lisinopril     Cough   . Simvastatin     myalgias   No results found for this or any previous visit (from the past 2160 hour(s)). Objective  Body mass index is 29.69 kg/m. Wt Readings from Last 3 Encounters:  09/28/18 225 lb (102.1 kg)  03/23/18 224 lb (101.6 kg)  03/23/18 224 lb 9.6 oz (101.9 kg)   Temp Readings from Last 3 Encounters:  09/28/18 98.1 F (36.7 C) (Oral)  03/23/18 98.1 F (36.7 C) (Oral)  03/23/18 98.1 F (36.7 C) (Oral)   BP Readings from Last 3 Encounters:  09/28/18 122/80  03/23/18 120/82  03/23/18 120/82   Pulse Readings from Last 3 Encounters:  09/28/18 87  03/23/18 93  03/23/18 93    Physical Exam  Constitutional: He is oriented to person, place, and time. Vital signs are normal. He appears well-developed and well-nourished. He is cooperative.  HENT:  Head: Normocephalic and atraumatic.  Mouth/Throat: Oropharynx is clear and moist and mucous membranes are normal.  Eyes: Pupils are equal, round, and reactive to light. Conjunctivae are normal.  Cardiovascular: Normal rate, regular rhythm and normal heart sounds.  Pulmonary/Chest: Effort normal and breath sounds normal.  Neurological: He is alert and oriented to person, place, and time. Gait normal.  Skin: Skin is warm, dry and intact.   Psychiatric: He has a normal mood and affect. His speech is normal and behavior is normal. Judgment and thought content normal. Cognition and memory are normal.  Nursing note and vitals reviewed.   Assessment   1.  HTN  2. Overactive bladder  3. HLD/CAD h/o complete heart block/AV block s/p pacemaker  4. HM 5. Right hand pain likely arthritis  Plan   1. Cont coreg 6.25 mg bid  2. Cont flomax and oxybutynin consider urology disc again  3. Cont meds BB, zetia f/u Dr. Einar Gip saw 03/2018  4.  Flu shot given today and prevnar utd pna 23 due in 1 year 10/20/18  rec pt had Tdap (pt declines today) and shingrix given Rx prev but disc consider get at [facility] or pharmacy .   PSA nl declines DRE rechecked 10/2018 labs   Dr. Watt Climes screening colonoscopy [facility]  -diverticulosis repeat in 10 years 01/22/18  -pt saw Dr. Nehemiah Massed 05/01/18 MM in situ left medial chest infraclavicular no lymphadenopathy rec f/u q3 months for 1 year then q4 months for a year and q6 months x 2 years  -derm f/u upcoming in 09/2018 or 10/2018   Considered CT chest repeat h/o smoking and CT chest in the past mild changes COPD and chronic bronchitis (of note calculated risk and low risk) also consider repeat CT ab/pelvis with and w/o contrast or MRI (reasons CT 2015 c/w calcified granuloma w/in liver and nonspecific foci w/in liver 7-10 mm and rec f/u also noted gallstones, kidney stones, left atrophic kidney) -pt declined both of these on 03/23/18   Labs after 11/06/18 fasting declines MMR screening   Reviewed cardiology notes 04/02/18 Dr. Einar Gip f/u heart block dual chamber pacemaker 02/05/14 rec add zetia f/u in 1 year   5. Prn tylenol f/u with Dr. Katy Fitch prn   Provider: Dr. Olivia Mackie McLean-Scocuzza-Internal Medicine

## 2018-10-22 ENCOUNTER — Telehealth: Payer: Self-pay | Admitting: Internal Medicine

## 2018-10-22 DIAGNOSIS — D229 Melanocytic nevi, unspecified: Secondary | ICD-10-CM | POA: Diagnosis not present

## 2018-10-22 DIAGNOSIS — Z1283 Encounter for screening for malignant neoplasm of skin: Secondary | ICD-10-CM | POA: Diagnosis not present

## 2018-10-22 DIAGNOSIS — L812 Freckles: Secondary | ICD-10-CM | POA: Diagnosis not present

## 2018-10-22 DIAGNOSIS — D485 Neoplasm of uncertain behavior of skin: Secondary | ICD-10-CM | POA: Diagnosis not present

## 2018-10-22 DIAGNOSIS — L578 Other skin changes due to chronic exposure to nonionizing radiation: Secondary | ICD-10-CM | POA: Diagnosis not present

## 2018-10-22 DIAGNOSIS — D225 Melanocytic nevi of trunk: Secondary | ICD-10-CM | POA: Diagnosis not present

## 2018-10-22 DIAGNOSIS — Z85820 Personal history of malignant melanoma of skin: Secondary | ICD-10-CM | POA: Diagnosis not present

## 2018-10-22 DIAGNOSIS — D2230 Melanocytic nevi of unspecified part of face: Secondary | ICD-10-CM | POA: Diagnosis not present

## 2018-10-22 DIAGNOSIS — N3281 Overactive bladder: Secondary | ICD-10-CM

## 2018-10-22 DIAGNOSIS — L821 Other seborrheic keratosis: Secondary | ICD-10-CM | POA: Diagnosis not present

## 2018-10-22 MED ORDER — OXYBUTYNIN CHLORIDE ER 5 MG PO TB24: 5.0000 mg | ORAL_TABLET | Freq: Every day | ORAL | 1 refills | Status: DC

## 2018-10-22 NOTE — Telephone Encounter (Signed)
Copied from [place] [id number]. Topic: Quick Communication - Rx Refill/Question >> Oct 22, 2018 10:53 AM Carolyn Stare wrote: Medication   oxybutynin (DITROPAN-XL) 5 MG 24 hr tablet  Has the patient contacted their pharmacy yes  was told to contact the office can they have been trying and wasn't getting a response    Preferred Pharmacy  [facility] [address]   Agent: Please be advised that RX refills may take up to 3 business days. We ask that you follow-up with your pharmacy.

## 2018-11-05 DIAGNOSIS — Z45018 Encounter for adjustment and management of other part of cardiac pacemaker: Secondary | ICD-10-CM | POA: Diagnosis not present

## 2018-11-05 DIAGNOSIS — Z950 Presence of cardiac pacemaker: Secondary | ICD-10-CM | POA: Diagnosis not present

## 2018-11-06 ENCOUNTER — Other Ambulatory Visit: Payer: Medicare Other

## 2018-11-06 ENCOUNTER — Ambulatory Visit: Payer: Medicare Other

## 2018-11-22 ENCOUNTER — Ambulatory Visit (INDEPENDENT_AMBULATORY_CARE_PROVIDER_SITE_OTHER): Payer: Medicare Other

## 2018-11-22 ENCOUNTER — Other Ambulatory Visit (INDEPENDENT_AMBULATORY_CARE_PROVIDER_SITE_OTHER): Payer: Medicare Other

## 2018-11-22 ENCOUNTER — Ambulatory Visit: Payer: Medicare Other

## 2018-11-22 DIAGNOSIS — Z1329 Encounter for screening for other suspected endocrine disorder: Secondary | ICD-10-CM | POA: Diagnosis not present

## 2018-11-22 DIAGNOSIS — E785 Hyperlipidemia, unspecified: Secondary | ICD-10-CM

## 2018-11-22 DIAGNOSIS — Z23 Encounter for immunization: Secondary | ICD-10-CM | POA: Diagnosis not present

## 2018-11-22 DIAGNOSIS — R3912 Poor urinary stream: Secondary | ICD-10-CM | POA: Diagnosis not present

## 2018-11-22 DIAGNOSIS — Z125 Encounter for screening for malignant neoplasm of prostate: Secondary | ICD-10-CM | POA: Diagnosis not present

## 2018-11-22 DIAGNOSIS — I10 Essential (primary) hypertension: Secondary | ICD-10-CM

## 2018-11-22 DIAGNOSIS — N401 Enlarged prostate with lower urinary tract symptoms: Secondary | ICD-10-CM | POA: Diagnosis not present

## 2018-11-22 DIAGNOSIS — Z1389 Encounter for screening for other disorder: Secondary | ICD-10-CM

## 2018-11-22 DIAGNOSIS — I2510 Atherosclerotic heart disease of native coronary artery without angina pectoris: Secondary | ICD-10-CM | POA: Diagnosis not present

## 2018-11-22 LAB — CBC WITH DIFFERENTIAL/PLATELET
BASOS PCT: 0.8 % (ref 0.0–3.0)
Basophils Absolute: 0 10*3/uL (ref 0.0–0.1)
EOS ABS: 0.1 10*3/uL (ref 0.0–0.7)
Eosinophils Relative: 3.1 % (ref 0.0–5.0)
HEMATOCRIT: 44.5 % (ref 39.0–52.0)
HEMOGLOBIN: 14.9 g/dL (ref 13.0–17.0)
LYMPHS ABS: 1.2 10*3/uL (ref 0.7–4.0)
Lymphocytes Relative: 26.5 % (ref 12.0–46.0)
MCHC: 33.4 g/dL (ref 30.0–36.0)
MCV: 90.4 fl (ref 78.0–100.0)
MONO ABS: 0.4 10*3/uL (ref 0.1–1.0)
Monocytes Relative: 8.3 % (ref 3.0–12.0)
NEUTROS ABS: 2.7 10*3/uL (ref 1.4–7.7)
Neutrophils Relative %: 61.3 % (ref 43.0–77.0)
PLATELETS: 137 10*3/uL — AB (ref 150.0–400.0)
RBC: 4.92 Mil/uL (ref 4.22–5.81)
RDW: 13.6 % (ref 11.5–15.5)
WBC: 4.5 10*3/uL (ref 4.0–10.5)

## 2018-11-22 LAB — COMPREHENSIVE METABOLIC PANEL
ALT: 30 U/L (ref 0–53)
AST: 24 U/L (ref 0–37)
Albumin: 4.1 g/dL (ref 3.5–5.2)
Alkaline Phosphatase: 48 U/L (ref 39–117)
BUN: 17 mg/dL (ref 6–23)
CO2: 28 mEq/L (ref 19–32)
Calcium: 9.2 mg/dL (ref 8.4–10.5)
Chloride: 105 mEq/L (ref 96–112)
Creatinine, Ser: 1.11 mg/dL (ref 0.40–1.50)
GFR: 69.34 mL/min (ref >60.00)
Glucose, Bld: 108 mg/dL — ABNORMAL HIGH (ref 70–99)
Potassium: 4.3 mEq/L (ref 3.5–5.1)
Sodium: 141 mEq/L (ref 135–145)
TOTAL PROTEIN: 6.4 g/dL (ref 6.0–8.3)
Total Bilirubin: 0.8 mg/dL (ref 0.2–1.2)

## 2018-11-22 LAB — LIPID PANEL
CHOL/HDL RATIO: 3
Cholesterol: 186 mg/dL (ref 0–200)
HDL: 60.2 mg/dL (ref >39.00)
LDL CALC: 110 mg/dL — AB (ref 0–99)
NonHDL: 125.74
Triglycerides: 78 mg/dL (ref 0.0–149.0)
VLDL: 15.6 mg/dL (ref 0.0–40.0)

## 2018-11-22 LAB — PSA, MEDICARE: PSA: 0.22 ng/ml (ref 0.10–4.00)

## 2018-11-22 LAB — TSH: TSH: 1.4 u[IU]/mL (ref 0.35–4.50)

## 2018-11-22 NOTE — Progress Notes (Signed)
Pt was seen today for NV for PSV-23 given IM on the RD. Pt tolerated well.

## 2018-11-22 NOTE — Addendum Note (Signed)
Addended by: Arby Barrette on: 11/22/2018 08:16 AM   Modules accepted: Orders

## 2018-11-23 LAB — URINALYSIS, ROUTINE W REFLEX MICROSCOPIC
BILIRUBIN UA: NEGATIVE
GLUCOSE, UA: NEGATIVE
Ketones, UA: NEGATIVE
LEUKOCYTES UA: NEGATIVE
Nitrite, UA: NEGATIVE
PROTEIN UA: NEGATIVE
RBC, UA: NEGATIVE
Specific Gravity, UA: 1.017 (ref 1.005–1.030)
Urobilinogen, Ur: 0.2 mg/dL (ref 0.2–1.0)
pH, UA: 5.5 (ref 5.0–7.5)

## 2018-11-27 ENCOUNTER — Ambulatory Visit: Payer: Medicare Other

## 2019-01-29 DIAGNOSIS — Z85820 Personal history of malignant melanoma of skin: Secondary | ICD-10-CM | POA: Diagnosis not present

## 2019-01-29 DIAGNOSIS — Z1283 Encounter for screening for malignant neoplasm of skin: Secondary | ICD-10-CM | POA: Diagnosis not present

## 2019-01-29 DIAGNOSIS — L821 Other seborrheic keratosis: Secondary | ICD-10-CM | POA: Diagnosis not present

## 2019-01-29 DIAGNOSIS — D2260 Melanocytic nevi of unspecified upper limb, including shoulder: Secondary | ICD-10-CM | POA: Diagnosis not present

## 2019-01-29 DIAGNOSIS — L988 Other specified disorders of the skin and subcutaneous tissue: Secondary | ICD-10-CM | POA: Diagnosis not present

## 2019-01-29 DIAGNOSIS — D1800 Hemangioma unspecified site: Secondary | ICD-10-CM | POA: Diagnosis not present

## 2019-01-29 DIAGNOSIS — D485 Neoplasm of uncertain behavior of skin: Secondary | ICD-10-CM | POA: Diagnosis not present

## 2019-01-29 DIAGNOSIS — L812 Freckles: Secondary | ICD-10-CM | POA: Diagnosis not present

## 2019-01-29 DIAGNOSIS — D2230 Melanocytic nevi of unspecified part of face: Secondary | ICD-10-CM | POA: Diagnosis not present

## 2019-01-29 DIAGNOSIS — D2270 Melanocytic nevi of unspecified lower limb, including hip: Secondary | ICD-10-CM | POA: Diagnosis not present

## 2019-01-29 DIAGNOSIS — L578 Other skin changes due to chronic exposure to nonionizing radiation: Secondary | ICD-10-CM | POA: Diagnosis not present

## 2019-01-29 DIAGNOSIS — D225 Melanocytic nevi of trunk: Secondary | ICD-10-CM | POA: Diagnosis not present

## 2019-02-04 DIAGNOSIS — Z45018 Encounter for adjustment and management of other part of cardiac pacemaker: Secondary | ICD-10-CM | POA: Diagnosis not present

## 2019-02-04 DIAGNOSIS — I442 Atrioventricular block, complete: Secondary | ICD-10-CM | POA: Diagnosis not present

## 2019-02-04 DIAGNOSIS — Z950 Presence of cardiac pacemaker: Secondary | ICD-10-CM | POA: Diagnosis not present

## 2019-02-05 DIAGNOSIS — D225 Melanocytic nevi of trunk: Secondary | ICD-10-CM | POA: Diagnosis not present

## 2019-02-26 ENCOUNTER — Other Ambulatory Visit: Payer: Self-pay | Admitting: Internal Medicine

## 2019-02-26 MED ORDER — OMEPRAZOLE 40 MG PO CPDR: 40.0000 mg | DELAYED_RELEASE_CAPSULE | Freq: Every day | ORAL | 3 refills | Status: DC

## 2019-03-26 ENCOUNTER — Ambulatory Visit (INDEPENDENT_AMBULATORY_CARE_PROVIDER_SITE_OTHER): Payer: Medicare Other | Admitting: Internal Medicine

## 2019-03-26 ENCOUNTER — Other Ambulatory Visit: Payer: Self-pay

## 2019-03-26 ENCOUNTER — Ambulatory Visit (INDEPENDENT_AMBULATORY_CARE_PROVIDER_SITE_OTHER): Payer: Medicare Other

## 2019-03-26 DIAGNOSIS — D696 Thrombocytopenia, unspecified: Secondary | ICD-10-CM | POA: Diagnosis not present

## 2019-03-26 DIAGNOSIS — N401 Enlarged prostate with lower urinary tract symptoms: Secondary | ICD-10-CM

## 2019-03-26 DIAGNOSIS — Z0000 Encounter for general adult medical examination without abnormal findings: Secondary | ICD-10-CM

## 2019-03-26 DIAGNOSIS — I10 Essential (primary) hypertension: Secondary | ICD-10-CM | POA: Diagnosis not present

## 2019-03-26 DIAGNOSIS — Z87442 Personal history of urinary calculi: Secondary | ICD-10-CM

## 2019-03-26 DIAGNOSIS — IMO0002 Reserved for concepts with insufficient information to code with codable children: Secondary | ICD-10-CM

## 2019-03-26 DIAGNOSIS — E785 Hyperlipidemia, unspecified: Secondary | ICD-10-CM | POA: Diagnosis not present

## 2019-03-26 DIAGNOSIS — N3281 Overactive bladder: Secondary | ICD-10-CM | POA: Diagnosis not present

## 2019-03-26 DIAGNOSIS — Q600 Renal agenesis, unilateral: Secondary | ICD-10-CM

## 2019-03-26 DIAGNOSIS — R3912 Poor urinary stream: Secondary | ICD-10-CM

## 2019-03-26 DIAGNOSIS — I2510 Atherosclerotic heart disease of native coronary artery without angina pectoris: Secondary | ICD-10-CM | POA: Diagnosis not present

## 2019-03-26 MED ORDER — TAMSULOSIN HCL 0.4 MG PO CAPS: 0.8000 mg | ORAL_CAPSULE | Freq: Every day | ORAL | 3 refills | Status: DC

## 2019-03-26 NOTE — Progress Notes (Signed)
Agree with note   TMS 

## 2019-03-26 NOTE — Progress Notes (Addendum)
Telephone Note failed Doxy  I connected with [name]  on 03/26/19 at  9:40 AM EDT by telephone and verified that I am speaking with the correct person using two identifiers.  Location patient: home Location provider:work  Persons participating in the virtual visit: patient, provider  I discussed the limitations of evaluation and management by telemedicine and the availability of in person appointments. The patient expressed understanding and agreed to proceed.   HPI: 1. Reviewed labs 11/22/2018 plts 137 this could be side effect of zetia <1% 2. Still c/o bladder urgency but medications flomax and oxybutynin helping he is agreeable to see urology in the future he is not having blurry vision with oxybutynin. He also has atrophic kidney and h/o kidney stones   ROS: See pertinent positives and negatives per HPI.  Past Medical History:  Diagnosis Date  . Atrophic kidney    left   . Cataract    left eye mild   . Chicken pox   . Degenerative disc disease, lumbar    L4/5 noted imaging  . Dyslipidemia   . Emphysema of lung ([place])    COPD/bronchitis noted on imaging former smoker ([id number])  . Gallstones   . Gallstones   . GERD (gastroesophageal reflux disease)   . Granuloma of liver   . Hiatal hernia    small   . History of esophageal stricture    05/08/09  . Hyperlipidemia   . Hypertension   . Kidney stones   . Kidney stones   . LVH (left ventricular hypertrophy)    mild on imaging   . Solitary kidney    left kidney is atrophic     Past Surgical History:  Procedure Laterality Date  . FOOT SURGERY     right mortons neuroma   . PACEMAKER PLACEMENT     2015 Dr. Caryl Comes for AV block  . PERMANENT PACEMAKER INSERTION N/A 02/05/2014   Procedure: PERMANENT PACEMAKER INSERTION;  Surgeon: Deboraha Sprang, MD;  Location: [facility] CATH LAB;  Service: Cardiovascular;  Laterality: N/A;    Family History  Problem Relation Age of Onset  . Diabetes Mother        died of complications   .  Cancer Father        bladder   . Stroke Father     SOCIAL HX:  Used to be in the [place]  2 kids (son and daughter) Married  Born [place] grew up in [place]  Works part time at [employer] smoker 1968 to 1998 < 1ppd no FH lung cancer  Drinks 2-3 small shots of liquor daily, no drugs, never chewed tobacco    Current Outpatient Medications:  .  carvedilol (COREG) 6.25 MG tablet, Take 1 tablet (6.25 mg total) by mouth 2 (two) times daily with a meal., Disp: 180 tablet, Rfl: 3 .  ezetimibe (ZETIA) 10 MG tablet, Take 1 tablet (10 mg total) by mouth daily. Continue to Take pravastatin in the nighttime, Disp: 90 tablet, Rfl: 3 .  omeprazole (PRILOSEC) 40 MG capsule, Take 1 capsule (40 mg total) by mouth daily. 30 minutes before breakfast, Disp: 90 capsule, Rfl: 3 .  oxybutynin (DITROPAN-XL) 5 MG 24 hr tablet, Take 1 tablet (5 mg total) by mouth at bedtime., Disp: 90 tablet, Rfl: 1 .  pravastatin (PRAVACHOL) 20 MG tablet, Take 1 tablet (20 mg total) by mouth daily. At night, Disp: 90 tablet, Rfl: 3 .  tamsulosin (FLOMAX) 0.4 MG CAPS capsule, Take 2 capsules (0.8 mg total)  by mouth daily after supper., Disp: 180 capsule, Rfl: 1  EXAM:  VITALS per patient if applicable:  GENERAL: alert, oriented, appears well and in no acute distress  PSYCH/NEURO: pleasant and cooperative, no obvious depression or anxiety, speech and thought processing grossly intact  ASSESSMENT AND PLAN:  Discussed the following assessment and plan:  Thrombocytopenia ([place]) - Plan: CBC w/Diff 05/27/2019 ?med related   Essential hypertension-cont meds BP controlled   Coronary artery disease involving native coronary artery of native heart without angina pectoris/HLD Results for KAYCEE, MCGAUGH (MRN [id number]) as of 03/26/2019 12:01  Ref. Range 11/06/2017 08:41 05/11/2018 08:20 11/22/2018 08:17  Total CHOL/HDL Ratio Unknown _0 Cholesterol Latest Ref Range: 0 - 200 mg/dL 208 (H) 219 (H) 186  HDL Cholesterol Latest Ref  Range: >39.00 mg/dL 68.00 61.30 60.20  LDL (calc) Latest Ref Range: 0 - 99 mg/dL 114 (H) 144 (H) 110 (H)  NonHDL Unknown 139.74 157.67 125.74  Triglycerides Latest Ref Range: 0.0 - 149.0 mg/dL 131.0 69.0 78.0  VLDL Latest Ref Range: 0.0 - 40.0 mg/dL 26.2 13.8 15.6    -disc with Dr. Einar Gip [place] as can only tolerate pravachol 20 mg not higher dose and zetia which has improved HLD  Benign prostatic hyperplasia with weak urinary stream, and urine urgency though improved with meds  Overactive bladder Solitary kidney  H/o kidney stones  -cont meds consider Dr. Diamantina Providence in future pt agreeable   HM Flu shot and prevnar utd pna 23 utd rec pt had Tdap(pt declines today)and shingrixgiven Rx prev but disc consider get at [facility] or pharmacy .  Declines MMR testing   Consider hep C testing in future will disc with pt   PSA nl declines DRE rechecked 11/2018    Dr. Watt Climes screening colonoscopy [facility] -diverticulosis repeat in 10 years 01/22/18  -pt saw Dr. Nehemiah Massed 05/01/18 MM in situ left medial chest infraclavicular no lymphadenopathy rec f/u q3 months for 1 year then q4 months for a year and q6 months x 2 years -derm f/u upcoming 05/2019   Considered CT chest repeat h/o smoking and CT chest in the past mild changes COPD and chronic bronchitis (of note calculated risk and low risk) also consider repeat CT ab/pelvis with and w/o contrast or MRI (reasons CT 2015 c/w calcified granuloma w/in liver and nonspecific foci w/in liver 7-10 mm and rec f/u also noted gallstones, kidney stones, left atrophic kidney) -pt declined both of these on 03/23/18    Reviewed cardiology notes 04/02/18 Dr. Einar Gip f/u heart block dual chamber pacemaker 02/05/14 rec add zetia f/u in 1 year    I discussed the assessment and treatment plan with the patient. The patient was provided an opportunity to ask questions and all were answered. The patient agreed with the plan and demonstrated an understanding of the  instructions.   The patient was advised to call back or seek an in-person evaluation if the symptoms worsen or if the condition fails to improve as anticipated.  Time spent 15 minutes  Delorise Jackson, MD

## 2019-03-26 NOTE — Progress Notes (Signed)
Subjective:   [name] is a 73 y.o. male who presents for Medicare Annual/Subsequent preventive examination.  Review of Systems:  No ROS.  Medicare Wellness Virtual Visit.  Visual/audio telehealth visit, UTA vital signs.   See social history for additional risk factors.  Cardiac Risk Factors include: advanced age (>40men, >85 women);hypertension     Objective:    Vitals: There were no vitals taken for this visit.  There is no height or weight on file to calculate BMI.  Advanced Directives 03/26/2019 03/23/2018 02/05/2014  Does Patient Have a Medical Advance Directive? No No Patient does not have advance directive;Patient would like information  Would patient like information on creating a medical advance directive? Yes (MAU/Ambulatory/Procedural Areas - Information given) No - Patient declined Advance directive packet given    Tobacco Social History   Tobacco Use  Smoking Status Former Smoker  . Last attempt to quit: 10/21/1991  . Years since quitting: 27.4  Smokeless Tobacco Never Used  Tobacco Comment   smoked [id number] <1 ppd no FH lung cancer      Counseling given: Not Answered Comment: smoked [id number] <1 ppd no FH lung cancer    Clinical Intake:  Pre-visit preparation completed: Yes        Diabetes: No  How often do you need to have someone help you when you read instructions, pamphlets, or other written materials from your doctor or pharmacy?: 1 - Never  Interpreter Needed?: No     Past Medical History:  Diagnosis Date  . Atrophic kidney    left   . Cataract    left eye mild   . Chicken pox   . Degenerative disc disease, lumbar    L4/5 noted imaging  . Dyslipidemia   . Emphysema of lung ([place])    COPD/bronchitis noted on imaging former smoker ([id number])  . Gallstones   . Gallstones   . GERD (gastroesophageal reflux disease)   . Granuloma of liver   . Hiatal hernia    small   . History of esophageal stricture    05/08/09  .  Hyperlipidemia   . Hypertension   . Kidney stones   . Kidney stones   . LVH (left ventricular hypertrophy)    mild on imaging   . Solitary kidney    left kidney is atrophic    Past Surgical History:  Procedure Laterality Date  . FOOT SURGERY     right mortons neuroma   . PACEMAKER PLACEMENT     2015 Dr. Caryl Comes for AV block  . PERMANENT PACEMAKER INSERTION N/A 02/05/2014   Procedure: PERMANENT PACEMAKER INSERTION;  Surgeon: Deboraha Sprang, MD;  Location: [facility] CATH [facility];  Service: Cardiovascular;  Laterality: N/A;   Family History  Problem Relation Age of Onset  . Diabetes Mother        died of complications   . Cancer Father        bladder   . Stroke Father    Social History   Socioeconomic History  . Marital status: Single    Spouse name: Not on file  . Number of children: Not on file  . Years of education: Not on file  . Highest education level: Not on file  Occupational History  . Not on file  Social Needs  . Financial resource strain: Not hard at all  . Food insecurity:    Worry: Never true    Inability: Never true  . Transportation needs:    Medical: No  Non-medical: No  Tobacco Use  . Smoking status: Former Smoker    Last attempt to quit: 10/21/1991    Years since quitting: 27.4  . Smokeless tobacco: Never Used  . Tobacco comment: smoked [id number] <1 ppd no FH lung cancer   Substance and Sexual Activity  . Alcohol use: Yes    Comment: Drinks 2-3 ounces of liquor daily  . Drug use: No  . Sexual activity: Not on file  Lifestyle  . Physical activity:    Days per week: 5 days    Minutes per session: 30 min  . Stress: Not at all  Relationships  . Social connections:    Talks on phone: Not on file    Gets together: Not on file    Attends religious service: Not on file    Active member of club or organization: Not on file    Attends meetings of clubs or organizations: Not on file    Relationship status: Not on file  Other Topics Concern  . Not on file   Social History Narrative   Used to be in the [place]    2 kids (son and daughter)   Married    Born [place] grew up in [place]    Works part time at [employer] smoker 1968 to 1998 < 1ppd no FH lung cancer    Drinks 2-3 small shots of liquor daily, no drugs, never chewed tobacco     Outpatient Encounter Medications as of 03/26/2019  Medication Sig  . carvedilol (COREG) 6.25 MG tablet Take 1 tablet (6.25 mg total) by mouth 2 (two) times daily with a meal.  . ezetimibe (ZETIA) 10 MG tablet Take 1 tablet (10 mg total) by mouth daily. Continue to Take pravastatin in the nighttime  . omeprazole (PRILOSEC) 40 MG capsule Take 1 capsule (40 mg total) by mouth daily. 30 minutes before breakfast  . oxybutynin (DITROPAN-XL) 5 MG 24 hr tablet Take 1 tablet (5 mg total) by mouth at bedtime.  . pravastatin (PRAVACHOL) 20 MG tablet Take 1 tablet (20 mg total) by mouth daily. At night   No facility-administered encounter medications on file as of 03/26/2019.     Activities of Daily Living In your present state of health, do you have any difficulty performing the following activities: 03/26/2019  Hearing? N  Vision? N  Difficulty concentrating or making decisions? N  Walking or climbing stairs? N  Dressing or bathing? N  Doing errands, shopping? N  Preparing Food and eating ? N  Using the Toilet? N  In the past six months, have you accidently leaked urine? N  Do you have problems with loss of bowel control? N  Managing your Medications? N  Managing your Finances? N  Housekeeping or managing your Housekeeping? N  Some recent data might be hidden    Patient Care Team: McLean-Scocuzza, Nino Glow, MD as PCP - General (Internal Medicine)   Assessment:   This is a routine wellness examination for [name].  I connected with patient 03/26/19 at 10:00 AM EDT by a video/audio enabled telemedicine application and verified that I am speaking with the correct person using two identifiers. Patient stated  full name and DOB. Patient gave permission to continue with virtual visit. Patient's location was at home and Nurse's location was at [place] office.   Health Screenings  Colonoscopy - 01/2018 Glaucoma -none Hearing -demonstrates normal hearing during visit. PSA- 11/2018 Cholesterol - 11/2018 Dental- visits every 12 months Vision- visits within  the last 12 months.  Social  Alcohol intake - yes      Smoking history-  former    Smokers in home? none Illicit drug use? none Exercise - walking, golfing and works 5 days weekly Diet - low cholesterol BMI- discussed the importance of a healthy diet, water intake and the benefits of aerobic exercise.  Educational material provided.   Safety  Patient feels safe at home- yes Patient does have smoke detectors at home- yes Patient does wear sunscreen or protective clothing when in direct sunlight -yes Patient does wear seat belt when in a moving vehicle -yes  Covid-19 precautions and sickness symptoms discussed.   Activities of Daily Living Patient denies needing assistance with: driving, household chores, feeding themselves, getting from bed to chair, getting to the toilet, bathing/showering, dressing, managing money, or preparing meals.  No new identified risk were noted.    Depression Screen Patient denies losing interest in daily life, feeling hopeless, or crying easily over simple problems.   Medication-taking as directed and without issues.   Fall Screen Patient denies being afraid of falling or falling in the last year.   Memory Screen Patient is alert.  Patient denies difficulty focusing, concentrating or misplacing items. Correctly identified the president of the [place] , season and recall.  Immunizations The following Immunizations were discussed: Influenza, shingles, pneumonia, and tetanus.   Other Providers Patient Care Team: McLean-Scocuzza, Nino Glow, MD as PCP - General (Internal Medicine)  Exercise Activities and  Dietary recommendations Current Exercise Habits: Home exercise routine, Type of exercise: walking(golfing), Time (Minutes): 30, Frequency (Times/Week): 5, Weekly Exercise (Minutes/Week): 150, Intensity: Mild  Goals    . Healthy Diet     Low cholesterol       Fall Risk Fall Risk  03/26/2019 09/28/2018 03/23/2018  Falls in the past year? 0 0 No   Depression Screen PHQ 2/9 Scores 03/26/2019 09/28/2018 03/23/2018  PHQ - 2 Score 0 0 0    Cognitive Function MMSE - Mini Mental State Exam 03/23/2018  Orientation to time 5  Orientation to Place 5  Registration 3  Attention/ Calculation 5  Recall 3  Language- name 2 objects 2  Language- repeat 1  Language- follow 3 step command 3  Language- read & follow direction 1  Write a sentence 1  Copy design 1  Total score 30     6CIT Screen 03/26/2019  What Year? 0 points  What month? 0 points  What time? 0 points  Count back from 20 0 points  Months in reverse 0 points  Repeat phrase 0 points  Total Score 0    Immunization History  Administered Date(s) Administered  . Influenza, High Dose Seasonal PF 09/28/2018  . Influenza-Unspecified 08/23/2017  . Pneumococcal Conjugate-13 10/20/2017  . Pneumococcal Polysaccharide-23 11/22/2018   Screening Tests Health Maintenance  Topic Date Due  . Hepatitis C Screening  [birth year]  . TETANUS/TDAP  07/25/1966  . INFLUENZA VACCINE  06/15/2019  . COLONOSCOPY  01/23/2028  . PNA vac Low Risk Adult  Completed       Plan:    End of life planning; Advance aging; Advanced directives discussed.  Copy of current HCPOA/Living Will requested upon completion.    I have personally reviewed and noted the following in the patient's chart:   . Medical and social history . Use of alcohol, tobacco or illicit drugs  . Current medications and supplements . Functional ability and status . Nutritional status . Physical activity . Advanced directives .  List of other physicians . Hospitalizations,  surgeries, and ER visits in previous 12 months . Vitals . Screenings to include cognitive, depression, and falls . Referrals and appointments  In addition, I have reviewed and discussed with patient certain preventive protocols, quality metrics, and best practice recommendations. A written personalized care plan for preventive services as well as general preventive health recommendations were provided to patient.     Varney Biles, LPN  02/15/7095

## 2019-03-26 NOTE — Progress Notes (Signed)
Pre visit review using our clinic review tool, if applicable. No additional management support is needed unless otherwise documented below in the visit note. 

## 2019-03-26 NOTE — Patient Instructions (Addendum)
  [name] , Thank you for taking time to come for your Medicare Wellness Visit. I appreciate your ongoing commitment to your health goals. Please review the following plan we discussed and let me know if I can assist you in the future.   These are the goals we discussed: Goals    . Healthy Diet     Low cholesterol       This is a list of the screening recommended for you and due dates:  Health Maintenance  Topic Date Due  .  Hepatitis C: One time screening is recommended by Center for Disease Control  (CDC) for  adults born from [birth year] through 1964.   [birth year]  . Tetanus Vaccine  07/25/1966  . Flu Shot  06/15/2019  . Colon Cancer Screening  01/23/2028  . Pneumonia vaccines  Completed

## 2019-03-27 ENCOUNTER — Other Ambulatory Visit: Payer: Self-pay | Admitting: Internal Medicine

## 2019-03-27 DIAGNOSIS — N401 Enlarged prostate with lower urinary tract symptoms: Secondary | ICD-10-CM

## 2019-03-27 MED ORDER — TAMSULOSIN HCL 0.4 MG PO CAPS: 0.8000 mg | ORAL_CAPSULE | Freq: Every day | ORAL | 3 refills | Status: DC

## 2019-04-01 ENCOUNTER — Other Ambulatory Visit: Payer: Self-pay

## 2019-04-01 ENCOUNTER — Ambulatory Visit (INDEPENDENT_AMBULATORY_CARE_PROVIDER_SITE_OTHER): Payer: Medicare Other | Admitting: Cardiology

## 2019-04-01 ENCOUNTER — Encounter: Payer: Self-pay | Admitting: Cardiology

## 2019-04-01 VITALS — BP 120/72 | Ht 73.0 in | Wt 220.0 lb

## 2019-04-01 DIAGNOSIS — I10 Essential (primary) hypertension: Secondary | ICD-10-CM | POA: Diagnosis not present

## 2019-04-01 DIAGNOSIS — Z950 Presence of cardiac pacemaker: Secondary | ICD-10-CM | POA: Diagnosis not present

## 2019-04-01 DIAGNOSIS — I2510 Atherosclerotic heart disease of native coronary artery without angina pectoris: Secondary | ICD-10-CM

## 2019-04-01 DIAGNOSIS — I442 Atrioventricular block, complete: Secondary | ICD-10-CM | POA: Diagnosis not present

## 2019-04-01 DIAGNOSIS — E7800 Pure hypercholesterolemia, unspecified: Secondary | ICD-10-CM

## 2019-04-02 ENCOUNTER — Encounter: Payer: Self-pay | Admitting: Cardiology

## 2019-04-02 NOTE — Progress Notes (Signed)
Virtual Visit via Video Note: This visit type was conducted due to national recommendations for restrictions regarding the COVID-19 Pandemic (e.g. social distancing).  This format is felt to be most appropriate for this patient at this time.  All issues noted in this document were discussed and addressed.  No physical exam was performed (except for noted visual exam findings with Telehealth visits).  The patient has consented to conduct a Telehealth visit and understands insurance will be billed.   I connected with@, on 04/02/19 at  by a video enabled telemedicine application and verified that I am speaking with the correct person using two identifiers.   I discussed the limitations of evaluation and management by telemedicine and the availability of in person appointments. The patient expressed understanding and agreed to proceed.   I have discussed with patient regarding the safety during COVID Pandemic and steps and precautions to be taken including social distancing, frequent hand wash and use of detergent soap, gels with the patient. I asked the patient to avoid touching mouth, nose, eyes, ears with the hands. I encouraged regular walking around the neighborhood and exercise and regular diet, as long as social distancing can be maintained.  Primary Physician/Referring:  McLean-Scocuzza, Nino Glow, MD  Patient ID: [name], male    DOB: 05/21/1946, 73 y.o.   MRN: [id number]  Chief Complaint  Patient presents with  . Coronary Artery Disease  . Hypertension  . Follow-up    HPI: [name]  is a 73 y.o. male  with hypertension, hyperlipidemia, coronary calcification by CT chest in 2015, unilateral kidney due to congenital absence of left kidney, complete heart block S/P St. Jude PM 2240 Dual chamber pacemaker implant 02/05/2014: Olin Pia, MD. He has history of known tolerance to statins, however on low dose of pravastatin, his lipids are improved significantly.  This is a annual  visit and he is presently doing well, Still working part-time, denies chest pain or shortness of breath, no palpitations or dizziness. Past Medical History:  Diagnosis Date  . Atrophic kidney    left   . Cataract    left eye mild   . Chicken pox   . Degenerative disc disease, lumbar    L4/5 noted imaging  . Dyslipidemia   . Emphysema of lung ([place])    COPD/bronchitis noted on imaging former smoker ([id number])  . Gallstones   . GERD (gastroesophageal reflux disease)   . Granuloma of liver   . Hiatal hernia    small   . History of esophageal stricture    05/08/09  . Hypertension   . Kidney stones   . LVH (left ventricular hypertrophy)    mild on imaging   . Solitary kidney    left kidney is atrophic     Past Surgical History:  Procedure Laterality Date  . FOOT SURGERY     right mortons neuroma   . PACEMAKER PLACEMENT     2015 Dr. Caryl Comes for AV block  . PERMANENT PACEMAKER INSERTION N/A 02/05/2014   Procedure: PERMANENT PACEMAKER INSERTION;  Surgeon: Deboraha Sprang, MD;  Location: [facility] CATH [facility];  Service: Cardiovascular;  Laterality: N/A;    Social History   Socioeconomic History  . Marital status: Married    Spouse name: Not on file  . Number of children: 2  . Years of education: Not on file  . Highest education level: Not on file  Occupational History  . Not on file  Social Needs  . Financial  resource strain: Not hard at all  . Food insecurity:    Worry: Never true    Inability: Never true  . Transportation needs:    Medical: No    Non-medical: No  Tobacco Use  . Smoking status: Former Smoker    Last attempt to quit: 10/21/1991    Years since quitting: 27.4  . Smokeless tobacco: Never Used  . Tobacco comment: smoked [id number] <1 ppd no FH lung cancer   Substance and Sexual Activity  . Alcohol use: Yes    Comment: Drinks 2-3 ounces of liquor daily  . Drug use: No  . Sexual activity: Not on file  Lifestyle  . Physical activity:    Days per week: 5 days     Minutes per session: 30 min  . Stress: Not at all  Relationships  . Social connections:    Talks on phone: Not on file    Gets together: Not on file    Attends religious service: Not on file    Active member of club or organization: Not on file    Attends meetings of clubs or organizations: Not on file    Relationship status: Not on file  . Intimate partner violence:    Fear of current or ex partner: No    Emotionally abused: No    Physically abused: No    Forced sexual activity: No  Other Topics Concern  . Not on file  Social History Narrative   Used to be in the [employer]    2 kids (son and daughter)   Married    Born [place] grew [place] in [place]    Works part time at [employer] smoker 1968 to 1998 < 1ppd no FH lung cancer    Drinks 2-3 small shots of liquor daily, no drugs, never chewed tobacco     Review of Systems  Constitution: Negative for chills, decreased appetite, malaise/fatigue and weight gain.  Cardiovascular: Negative for dyspnea on exertion, leg swelling and syncope.  Endocrine: Negative for cold intolerance.  Hematologic/Lymphatic: Does not bruise/bleed easily.  Musculoskeletal: Positive for joint pain (occasional knee).  Gastrointestinal: Negative for abdominal pain, anorexia, change in bowel habit, hematochezia and melena.  Neurological: Negative for headaches and light-headedness.  Psychiatric/Behavioral: Negative for depression and substance abuse.  All other systems reviewed and are negative.     Objective  Blood pressure 120/72, height 6\' 1"  (1.854 m), weight 220 lb (99.8 kg). Body mass index is 29.03 kg/m.    Physical Exam  Constitutional: He appears well-developed and well-nourished. No distress.  HENT:  Head: Atraumatic.  Eyes: Conjunctivae are normal.  Neck: Neck supple. No JVD present. No thyromegaly present.  Cardiovascular: Normal rate, regular rhythm, normal heart sounds and intact distal pulses. Exam reveals no gallop.  No murmur heard.  Pulmonary/Chest: Effort normal and breath sounds normal.  Pacemaker pocket noted in the left infraclavicular region  Abdominal: Soft. Bowel sounds are normal.  Musculoskeletal: Normal range of motion.  Neurological: He is alert.  Skin: Skin is warm and dry.  Psychiatric: He has a normal mood and affect.   Radiology: No results found.  Laboratory examination:    CMP Latest Ref Rng & Units 11/22/2018 05/11/2018 11/06/2017  Glucose 70 - 99 mg/dL 108(H) 141(H) 110(H)  BUN 6 - 23 mg/dL 17 13 21   Creatinine 0.40 - 1.50 mg/dL 1.11 1.09 1.20  Sodium 135 - 145 mEq/L 141 141 138  Potassium 3.5 - 5.1 mEq/L 4.3 4.2 3.8  Chloride 96 - 112 mEq/L 105 107 101  CO2 19 - 32 mEq/L 28 27 30   Calcium 8.4 - 10.5 mg/dL 9.2 9.1 9.0  Total Protein 6.0 - 8.3 g/dL 6.4 - 6.9  Total Bilirubin 0.2 - 1.2 mg/dL 0.8 - 1.2  Alkaline Phos 39 - 117 U/L 48 - 51  AST 0 - 37 U/L 24 - 16  ALT 0 - 53 U/L 30 - 16   CBC Latest Ref Rng & Units 11/22/2018 11/06/2017 02/05/2014  WBC 4.0 - 10.5 K/uL 4.5 4.4 4.8  Hemoglobin 13.0 - 17.0 g/dL 14.9 15.1 14.4  Hematocrit 39.0 - 52.0 % 44.5 45.5 42.9  Platelets 150.0 - 400.0 K/uL 137.0(L) 169.0 162   Lipid Panel     Component Value Date/Time   CHOL 186 11/22/2018 0817   TRIG 78.0 11/22/2018 0817   HDL 60.20 11/22/2018 0817   CHOLHDL 3 11/22/2018 0817   VLDL 15.6 11/22/2018 0817   LDLCALC 110 (H) 11/22/2018 0817   HEMOGLOBIN A1C Lab Results  Component Value Date   HGBA1C 5.6 05/11/2018   TSH Recent Labs    11/22/18 0817  TSH 1.40    PRN Meds:. There are no discontinued medications. Current Meds  Medication Sig  . carvedilol (COREG) 6.25 MG tablet Take 1 tablet (6.25 mg total) by mouth 2 (two) times daily with a meal.  . ezetimibe (ZETIA) 10 MG tablet Take 1 tablet (10 mg total) by mouth daily. Continue to Take pravastatin in the nighttime  . omeprazole (PRILOSEC) 40 MG capsule Take 1 capsule (40 mg total) by mouth daily. 30 minutes before breakfast  .  oxybutynin (DITROPAN-XL) 5 MG 24 hr tablet Take 1 tablet (5 mg total) by mouth at bedtime.  . pravastatin (PRAVACHOL) 20 MG tablet Take 1 tablet (20 mg total) by mouth daily. At night  . tamsulosin (FLOMAX) 0.4 MG CAPS capsule Take 2 capsules (0.8 mg total) by mouth daily after supper.    Cardiac Studies:   Abdominal aortic duplex 04/25/2017: Focal plaque noted in the proximal, mid and distal aorta. Clinical correlation is suggested. Aortic ectasia noted maximum measuring 2.9 x 2.91 x 2.88 cm. no AAA. Enlarged inferior vena cava suggests elevated central venous pressure. Consider rescreening in 5 years.    Echocardiogram 02/05/2014: Normal LV systolic function, EF 82-50%, mild LVH.  Mild TR.  Assessment   Atrioventricular block, complete ([place])  Cardiac pacemaker in situ  Essential hypertension  Hypercholesteremia  Pacemaker Remote: 3.17.20: DDD PM. <1% 12 AMS, max 1.39mins; ATs.   EKG 11/11/2016: AV paced at 60 bpm, no further analysis due to paced rhythm.  Recommendations:   Patient is here on a annual visit, presently doing well with regard to sinus node dysfunction and complete heart block. Pacemaker is functioning normally.  I would like to see him up for a pacemaker clinic check as it is been a year.   With regard to hypertension, blood pressure is well controlled, I have discussed with him regarding hyperglycemia as well and weight loss of 10 pounds was discussed.  With regard to her lipids, they have markedly improved on pravastatin low dose along with Zetia, he has not been able to tolerate a doses of statins and other statins in the past.  I would like to consider re-challenge with Lipitor or Crestor, which he did not tolerate previously. Other option is to try Livalo. No changes in the medications were done today. I reviewed the labs from his PCP.  Adrian Prows, MD, [facility]  04/02/2019, 4:01 AM [facility]. [place] Pager: [phone] Office: [phone] If no answer  Cell [phone]

## 2019-05-02 ENCOUNTER — Other Ambulatory Visit: Payer: Self-pay | Admitting: Internal Medicine

## 2019-05-02 DIAGNOSIS — N3281 Overactive bladder: Secondary | ICD-10-CM

## 2019-05-02 MED ORDER — OXYBUTYNIN CHLORIDE ER 5 MG PO TB24: 5.0000 mg | ORAL_TABLET | Freq: Every day | ORAL | 1 refills | Status: DC

## 2019-05-05 ENCOUNTER — Encounter: Payer: Self-pay | Admitting: Cardiology

## 2019-05-05 DIAGNOSIS — Z45018 Encounter for adjustment and management of other part of cardiac pacemaker: Secondary | ICD-10-CM

## 2019-05-05 HISTORY — DX: Encounter for adjustment and management of other part of cardiac pacemaker: Z45.018

## 2019-05-06 DIAGNOSIS — Z950 Presence of cardiac pacemaker: Secondary | ICD-10-CM | POA: Diagnosis not present

## 2019-05-06 DIAGNOSIS — I442 Atrioventricular block, complete: Secondary | ICD-10-CM | POA: Diagnosis not present

## 2019-05-06 DIAGNOSIS — Z45018 Encounter for adjustment and management of other part of cardiac pacemaker: Secondary | ICD-10-CM | POA: Diagnosis not present

## 2019-05-08 DIAGNOSIS — L578 Other skin changes due to chronic exposure to nonionizing radiation: Secondary | ICD-10-CM | POA: Diagnosis not present

## 2019-05-08 DIAGNOSIS — Z85820 Personal history of malignant melanoma of skin: Secondary | ICD-10-CM | POA: Diagnosis not present

## 2019-05-08 DIAGNOSIS — Z1283 Encounter for screening for malignant neoplasm of skin: Secondary | ICD-10-CM | POA: Diagnosis not present

## 2019-05-08 DIAGNOSIS — D2272 Melanocytic nevi of left lower limb, including hip: Secondary | ICD-10-CM | POA: Diagnosis not present

## 2019-05-08 DIAGNOSIS — D225 Melanocytic nevi of trunk: Secondary | ICD-10-CM | POA: Diagnosis not present

## 2019-05-08 DIAGNOSIS — L812 Freckles: Secondary | ICD-10-CM | POA: Diagnosis not present

## 2019-05-08 DIAGNOSIS — D485 Neoplasm of uncertain behavior of skin: Secondary | ICD-10-CM | POA: Diagnosis not present

## 2019-05-08 DIAGNOSIS — L918 Other hypertrophic disorders of the skin: Secondary | ICD-10-CM | POA: Diagnosis not present

## 2019-05-08 DIAGNOSIS — L821 Other seborrheic keratosis: Secondary | ICD-10-CM | POA: Diagnosis not present

## 2019-05-08 DIAGNOSIS — D2230 Melanocytic nevi of unspecified part of face: Secondary | ICD-10-CM | POA: Diagnosis not present

## 2019-05-08 DIAGNOSIS — D229 Melanocytic nevi, unspecified: Secondary | ICD-10-CM | POA: Diagnosis not present

## 2019-05-13 ENCOUNTER — Ambulatory Visit: Payer: No Typology Code available for payment source

## 2019-05-14 ENCOUNTER — Other Ambulatory Visit: Payer: Self-pay | Admitting: Internal Medicine

## 2019-05-14 DIAGNOSIS — E785 Hyperlipidemia, unspecified: Secondary | ICD-10-CM

## 2019-05-14 MED ORDER — EZETIMIBE 10 MG PO TABS: 10.0000 mg | ORAL_TABLET | Freq: Every day | ORAL | 3 refills | Status: DC

## 2019-05-15 ENCOUNTER — Ambulatory Visit: Payer: No Typology Code available for payment source

## 2019-05-24 ENCOUNTER — Other Ambulatory Visit (INDEPENDENT_AMBULATORY_CARE_PROVIDER_SITE_OTHER): Payer: Medicare Other

## 2019-05-24 ENCOUNTER — Other Ambulatory Visit: Payer: Self-pay

## 2019-05-24 DIAGNOSIS — D696 Thrombocytopenia, unspecified: Secondary | ICD-10-CM | POA: Diagnosis not present

## 2019-05-24 LAB — CBC WITH DIFFERENTIAL/PLATELET
Absolute Monocytes: 385 cells/uL (ref 200–950)
Basophils Absolute: 52 cells/uL (ref 0–200)
Basophils Relative: 1 %
Eosinophils Absolute: 151 cells/uL (ref 15–500)
Eosinophils Relative: 2.9 %
HCT: 42.4 % (ref 38.5–50.0)
Hemoglobin: 14.7 g/dL (ref 13.2–17.1)
Lymphs Abs: 1981 cells/uL (ref 850–3900)
MCH: 30.3 pg (ref 27.0–33.0)
MCHC: 34.7 g/dL (ref 32.0–36.0)
MCV: 87.4 fL (ref 80.0–100.0)
MPV: 10.7 fL (ref 7.5–12.5)
Monocytes Relative: 7.4 %
Neutro Abs: 2631 cells/uL (ref 1500–7800)
Neutrophils Relative %: 50.6 %
Platelets: 161 10*3/uL (ref 140–400)
RBC: 4.85 10*6/uL (ref 4.20–5.80)
RDW: 13 % (ref 11.0–15.0)
Total Lymphocyte: 38.1 %
WBC: 5.2 10*3/uL (ref 3.8–10.8)

## 2019-05-24 NOTE — Addendum Note (Signed)
Addended by: Leeanne Rio on: 05/24/2019 03:10 PM   Modules accepted: Orders

## 2019-05-27 ENCOUNTER — Other Ambulatory Visit: Payer: Medicare Other

## 2019-05-28 ENCOUNTER — Telehealth: Payer: Self-pay | Admitting: Internal Medicine

## 2019-05-28 NOTE — Telephone Encounter (Signed)
Result note read to patient; verbalizes understanding.  Telephone encounter as results not routed to [facility]. 

## 2019-06-18 ENCOUNTER — Ambulatory Visit (INDEPENDENT_AMBULATORY_CARE_PROVIDER_SITE_OTHER): Payer: Medicare Other | Admitting: Cardiology

## 2019-06-18 ENCOUNTER — Other Ambulatory Visit: Payer: Self-pay

## 2019-06-18 DIAGNOSIS — Z45018 Encounter for adjustment and management of other part of cardiac pacemaker: Secondary | ICD-10-CM

## 2019-06-18 DIAGNOSIS — I442 Atrioventricular block, complete: Secondary | ICD-10-CM

## 2019-06-18 DIAGNOSIS — I10 Essential (primary) hypertension: Secondary | ICD-10-CM

## 2019-06-18 DIAGNOSIS — Z950 Presence of cardiac pacemaker: Secondary | ICD-10-CM

## 2019-06-18 NOTE — Progress Notes (Signed)
Patient is here for a clinic visit for pacemaker check.  Otherwise remains well, no other symptoms. Has  St. Jude PM  2240 Dual chamber pacemaker implant  02/05/2014.  Scheduled In office pacemaker check 06/18/2019:  Underlying complete heart block. Pacer dependant. Normal thresholds. One brief mode switch, EGM show brief AT. Longevity 8.6 to 9.3 years.   Findings discussed with the patient, he will continue remote monitoring. I will see him back in the office in 1 year for hypertension and pacer check  Adrian Prows, MD, [facility] 06/18/2019, 4:37 PM [facility]. [place] Pager: [phone] Office: [phone] If no answer Cell [phone]

## 2019-06-24 ENCOUNTER — Telehealth: Payer: Self-pay | Admitting: Cardiology

## 2019-06-24 DIAGNOSIS — I484 Atypical atrial flutter: Secondary | ICD-10-CM

## 2019-06-24 NOTE — Telephone Encounter (Signed)
Unscheduled (Alert) 06/24/2019:  There was 1 atrial high rate episode lasting 01:17:37:24 in duration. EGM shows Atypical A. flutter. There was a 32 % cumulative atrial arrhythmia burden. Battery longevity is 8.8-9.3 years. RA pacing is 52.0 %, RV pacing is >99.0 %.  CHA2DS2-VASc Score is 3.  Yearly risk of stroke: 3.2%.   Needs anticoagulation with Eliquis 5 mg BID, will discuss with patient. Needs OV in 2 weeks

## 2019-06-25 NOTE — Telephone Encounter (Signed)
Pt doesn't want to start anything until he speaks to you first he did go ahead and set up appt

## 2019-06-25 NOTE — Telephone Encounter (Signed)
Set up terle encounter if he wants or bring him in to be seen sooner

## 2019-06-26 NOTE — Telephone Encounter (Signed)
Pt will call later this afternoon to schedule a telephone with [place] sooner than 8/27; for Atrial Flutter

## 2019-06-27 ENCOUNTER — Telehealth: Payer: Self-pay

## 2019-07-05 ENCOUNTER — Other Ambulatory Visit: Payer: Self-pay | Admitting: Internal Medicine

## 2019-07-05 DIAGNOSIS — I10 Essential (primary) hypertension: Secondary | ICD-10-CM

## 2019-07-05 DIAGNOSIS — I2510 Atherosclerotic heart disease of native coronary artery without angina pectoris: Secondary | ICD-10-CM

## 2019-07-05 MED ORDER — CARVEDILOL 6.25 MG PO TABS: 6.2500 mg | ORAL_TABLET | Freq: Two times a day (BID) | ORAL | 3 refills | Status: DC

## 2019-07-11 ENCOUNTER — Ambulatory Visit (INDEPENDENT_AMBULATORY_CARE_PROVIDER_SITE_OTHER): Payer: Medicare Other | Admitting: Cardiology

## 2019-07-11 ENCOUNTER — Encounter: Payer: Self-pay | Admitting: Cardiology

## 2019-07-11 ENCOUNTER — Other Ambulatory Visit: Payer: Self-pay

## 2019-07-11 VITALS — BP 118/80 | HR 61 | Temp 99.2°F | Ht 73.0 in | Wt 216.0 lb

## 2019-07-11 DIAGNOSIS — Z950 Presence of cardiac pacemaker: Secondary | ICD-10-CM | POA: Diagnosis not present

## 2019-07-11 DIAGNOSIS — I442 Atrioventricular block, complete: Secondary | ICD-10-CM

## 2019-07-11 DIAGNOSIS — I2510 Atherosclerotic heart disease of native coronary artery without angina pectoris: Secondary | ICD-10-CM

## 2019-07-11 DIAGNOSIS — I484 Atypical atrial flutter: Secondary | ICD-10-CM | POA: Diagnosis not present

## 2019-07-11 NOTE — Progress Notes (Signed)
Primary Physician/Referring:  McLean-Scocuzza, Nino Glow, MD  Patient ID: [name], male    DOB: April 18, 1946, 73 y.o.   MRN: [id number]  Chief Complaint  Patient presents with  . Coronary Artery Disease  . Atrial Fibrillation  . Follow-up    2 weeks    HPI: [name]  is a 73 y.o. male  with hypertension, hyperlipidemia, coronary calcification by CT chest in 2015, unilateral kidney due to congenital absence of left kidney, complete heart block S/P St. Jude PM 2240 Dual chamber pacemaker implant 02/05/2014: Olin Pia, MD. He has history of known tolerance to statins, however on low dose of pravastatin, his lipids are improved significantly.  He recently had remote pacemaker alert which showed persistent atrial fibrillation/flutter, I have sent a message regarding starting anticoagulation and to see him back in the office for follow-up and to discuss further.  He did not want to start medications without talking to me, states that he is essentially asymptomatic and presents here for follow-up.  Past Medical History:  Diagnosis Date  . Atrophic kidney    left   . Cataract    left eye mild   . Chicken pox   . Degenerative disc disease, lumbar    L4/5 noted imaging  . Dyslipidemia   . Emphysema of lung ([place])    COPD/bronchitis noted on imaging former smoker [id number]  . Gallstones   . GERD (gastroesophageal reflux disease)   . Granuloma of liver   . Hiatal hernia    small   . History of esophageal stricture    05/08/09  . Kidney stones   . LVH (left ventricular hypertrophy)    mild on imaging   . Solitary kidney    left kidney is atrophic     Past Surgical History:  Procedure Laterality Date  . FOOT SURGERY     right mortons neuroma   . PACEMAKER PLACEMENT     2015 Dr. Caryl Comes for AV block  . PERMANENT PACEMAKER INSERTION N/A 02/05/2014   Procedure: PERMANENT PACEMAKER INSERTION;  Surgeon: Deboraha Sprang, MD;  Location: [facility] CATH LAB;  Service: Cardiovascular;   Laterality: N/A;    Social History   Socioeconomic History  . Marital status: Married    Spouse name: Not on file  . Number of children: 2  . Years of education: Not on file  . Highest education level: Not on file  Occupational History  . Not on file  Social Needs  . Financial resource strain: Not hard at all  . Food insecurity    Worry: Never true    Inability: Never true  . Transportation needs    Medical: No    Non-medical: No  Tobacco Use  . Smoking status: Former Smoker    Packs/day: 1.00    Years: 20.00    Pack years: 20.00    Types: Cigarettes    Quit date: 10/21/1991    Years since quitting: 27.7  . Smokeless tobacco: Never Used  . Tobacco comment: smoked [id number] <1 ppd no FH lung cancer   Substance and Sexual Activity  . Alcohol use: Yes    Comment: Drinks 2-3 ounces of liquor daily  . Drug use: No  . Sexual activity: Not on file  Lifestyle  . Physical activity    Days per week: 5 days    Minutes per session: 30 min  . Stress: Not at all  Relationships  . Social Herbalist on phone: Not  on file    Gets together: Not on file    Attends religious service: Not on file    Active member of club or organization: Not on file    Attends meetings of clubs or organizations: Not on file    Relationship status: Not on file  . Intimate partner violence    Fear of current or ex partner: No    Emotionally abused: No    Physically abused: No    Forced sexual activity: No  Other Topics Concern  . Not on file  Social History Narrative   Used to be in the [place]    2 kids (son and daughter)   Married    Born [place] grew up in [place]    Works part time at [employer] smoker 1968 to 1998 < 1ppd no FH lung cancer    Drinks 2-3 small shots of liquor daily, no drugs, never chewed tobacco    Review of Systems  Constitution: Negative for chills, decreased appetite, malaise/fatigue and weight gain.  Cardiovascular: Negative for dyspnea on exertion,  leg swelling and syncope.  Endocrine: Negative for cold intolerance.  Hematologic/Lymphatic: Does not bruise/bleed easily.  Musculoskeletal: Positive for joint pain (occasional knee).  Gastrointestinal: Negative for abdominal pain, anorexia, change in bowel habit, hematochezia and melena.  Neurological: Negative for headaches and light-headedness.  Psychiatric/Behavioral: Negative for depression and substance abuse.  All other systems reviewed and are negative.  Objective  Blood pressure 118/80, pulse 61, temperature 99.2 F (37.3 C), height 6\' 1"  (1.854 m), weight 216 lb (98 kg), SpO2 96 %. Body mass index is 28.5 kg/m.    Physical Exam  Constitutional: He appears well-developed and well-nourished. No distress.  HENT:  Head: Atraumatic.  Eyes: Conjunctivae are normal.  Neck: Neck supple. No JVD present. No thyromegaly present.  Cardiovascular: Normal rate, regular rhythm, normal heart sounds and intact distal pulses. Exam reveals no gallop.  No murmur heard. Pulmonary/Chest: Effort normal and breath sounds normal.  Pacemaker pocket noted in the left infraclavicular region  Abdominal: Soft. Bowel sounds are normal.  Musculoskeletal: Normal range of motion.  Neurological: He is alert.  Skin: Skin is warm and dry.  Psychiatric: He has a normal mood and affect.   Radiology: No results found.  Laboratory examination:    CMP Latest Ref Rng & Units 11/22/2018 05/11/2018 11/06/2017  Glucose 70 - 99 mg/dL 108(H) 141(H) 110(H)  BUN 6 - 23 mg/dL 17 13 21   Creatinine 0.40 - 1.50 mg/dL 1.11 1.09 1.20  Sodium 135 - 145 mEq/L 141 141 138  Potassium 3.5 - 5.1 mEq/L 4.3 4.2 3.8  Chloride 96 - 112 mEq/L 105 107 101  CO2 19 - 32 mEq/L 28 27 30   Calcium 8.4 - 10.5 mg/dL 9.2 9.1 9.0  Total Protein 6.0 - 8.3 g/dL 6.4 - 6.9  Total Bilirubin 0.2 - 1.2 mg/dL 0.8 - 1.2  Alkaline Phos 39 - 117 U/L 48 - 51  AST 0 - 37 U/L 24 - 16  ALT 0 - 53 U/L 30 - 16   CBC Latest Ref Rng & Units 05/24/2019  11/22/2018 11/06/2017  WBC 3.8 - 10.8 Thousand/uL 5.2 4.5 4.4  Hemoglobin 13.2 - 17.1 g/dL 14.7 14.9 15.1  Hematocrit 38.5 - 50.0 % 42.4 44.5 45.5  Platelets 140 - 400 Thousand/uL 161 137.0(L) 169.0   Lipid Panel     Component Value Date/Time   CHOL 186 11/22/2018 0817   TRIG 78.0 11/22/2018 0817  HDL 60.20 11/22/2018 0817   CHOLHDL 3 11/22/2018 0817   VLDL 15.6 11/22/2018 0817   LDLCALC 110 (H) 11/22/2018 0817   HEMOGLOBIN A1C Lab Results  Component Value Date   HGBA1C 5.6 05/11/2018   TSH Recent Labs    11/22/18 0817  TSH 1.40    PRN Meds:. There are no discontinued medications. Current Meds  Medication Sig  . carvedilol (COREG) 6.25 MG tablet Take 1 tablet (6.25 mg total) by mouth 2 (two) times daily with a meal.  . ezetimibe (ZETIA) 10 MG tablet Take 1 tablet (10 mg total) by mouth daily. Continue to Take pravastatin in the nighttime  . omeprazole (PRILOSEC) 40 MG capsule Take 1 capsule (40 mg total) by mouth daily. 30 minutes before breakfast  . oxybutynin (DITROPAN-XL) 5 MG 24 hr tablet Take 1 tablet (5 mg total) by mouth at bedtime.  . pravastatin (PRAVACHOL) 20 MG tablet Take 1 tablet (20 mg total) by mouth daily. At night  . tamsulosin (FLOMAX) 0.4 MG CAPS capsule Take 2 capsules (0.8 mg total) by mouth daily after supper.    Cardiac Studies:   Abdominal aortic duplex 04/25/2017: Focal plaque noted in the proximal, mid and distal aorta. Clinical correlation is suggested. Aortic ectasia noted maximum measuring 2.9 x 2.91 x 2.88 cm. no AAA. Enlarged inferior vena cava suggests elevated central venous pressure. Consider rescreening in 5 years.    Echocardiogram 02/05/2014: Normal LV systolic function, EF 0000000, mild LVH.  Mild TR.  Assessment   Atypical atrial flutter (HCC) - CHA2DS2-VASc Score is 3.  Yearly risk of stroke: 3.2%.  - Plan: EKG 12-Lead  Atrioventricular block, complete (HCC) - Plan: EKG 12-Lead  Essential hypertension  Pacemaker St. Jude PM   2240 Dual chamber pacemaker implant  02/05/2014  Remote pacemaker check  6.16.20:  <1%, 7 AMS episodes, max 10secs - ATs. longevity is 8.7-9.5 years. RA pacing is 78 %, RV pacing is >99 %.  Remote pacemaker check  Unscheduled (Alert) 06/24/2019:  There was 1 atrial high rate episode lasting 01:17:37:24 in duration. EGM shows A. Flutter/fib. There was a 32 % cumulative atrial arrhythmia burden. Battery longevity is 8.8-9.3 years. RA pacing is 52.0 %, RV pacing is >99.0 %.  Scheduled In office pacemaker check 06/18/2019:  Underlying complete heart block. Pacer dependant. Normal thresholds. One brief mode switch, EGM show brief AT. Longevity 8.6 to 9.3 years.     EKG 07/11/2019: Probably AP rhythm with 1st degree AV Block. V paced rhythm @ 60/min. No further analysis.   Recommendations:   Patient has had an episode of atrial flutter, by remote alert on the pacemaker on 06/24/2019 with a 32% atrial arrhythmia burden.  EGM reveals mostly A. fib/flutter, sustained.  I have discussed with him regarding need for long-term anticoagulation in view of his cardioembolic risk.  Patient prefers not to be on anticoagulation unless he is in persistent atrial fibrillation/flutter.  As it was a late appointment, I set him up for a clinic pacemaker check tomorrow to see whether he is still in persistent atrial flutter/fibrillation.  I would like to also change the sensitivity to improve atrial fibrillation detection.  This was a 15 minutes of face-to-face encounter, discussions regarding risks and benefits of anticoagulation. I also offered him to get second opinion from Dr. Jolyn Nap (EP).  With regard to lipids, they have markedly improved on pravastatin low dose along with Zetia, he has not been able to tolerate a doses of statins and other statins in  the past. He does need repeat lipid profile testing now that he is on stable dose of statin/Zetia.    Adrian Prows, MD, [facility] 07/11/2019, 6:06 PM [place]  [facility] [place] Pager: [phone] Office: [phone] If no answer Cell [phone]

## 2019-07-12 ENCOUNTER — Encounter: Payer: Self-pay | Admitting: Cardiology

## 2019-07-12 ENCOUNTER — Ambulatory Visit (INDEPENDENT_AMBULATORY_CARE_PROVIDER_SITE_OTHER): Payer: Medicare Other | Admitting: Cardiology

## 2019-07-12 DIAGNOSIS — Z950 Presence of cardiac pacemaker: Secondary | ICD-10-CM | POA: Diagnosis not present

## 2019-07-12 DIAGNOSIS — Z45018 Encounter for adjustment and management of other part of cardiac pacemaker: Secondary | ICD-10-CM | POA: Diagnosis not present

## 2019-07-12 DIAGNOSIS — I480 Paroxysmal atrial fibrillation: Secondary | ICD-10-CM

## 2019-07-12 DIAGNOSIS — I442 Atrioventricular block, complete: Secondary | ICD-10-CM

## 2019-07-12 DIAGNOSIS — I2510 Atherosclerotic heart disease of native coronary artery without angina pectoris: Secondary | ICD-10-CM

## 2019-07-12 MED ORDER — APIXABAN 5 MG PO TABS: 5.0000 mg | ORAL_TABLET | Freq: Two times a day (BID) | ORAL | 3 refills | Status: DC

## 2019-07-12 NOTE — Progress Notes (Signed)
Scheduled In office pacemaker check 07/12/2019: Underlying complete heart block with ventricular escape at 32 bpm, atrial rate 56 bpm.  Paroxysmal atrial fibrillation that started on 06/22/2019 through 07/03/2019, undersensing of the episodes noted.  Change sensitivity to 0.3 MV from 0.5 MV. AP 43%,VP 100%.  AMS episodes 45, mode switch 47%, AT/AF burden 49% since 06/18/2019.  Longevity 9 years.  Normal pacemaker function.  CHA2DS2-VASc Score is 3.  Yearly risk of stroke: 3.2%.  Score of 1=1.3; 2=2.2; 3=3.2; 4=4; 5=6.7; 6=9.8; 7=>9.8) -(CHF; HTN; vasc disease DM,  Male = 1; Age <65 =0; 65-74 = 1,  >75 =2; stroke = 2).   Age >65Y, coronary atherosclerosis, hypertension.    ICD-10-CM   1. Encounter for care of pacemaker  Z45.018   2. Pacemaker St. Jude PM  2240 Dual chamber pacemaker implant  02/05/2014  Z95.0   3. Atrioventricular block, complete (HCC)  I44.2   4. Paroxysmal atrial fibrillation (HCC)  I48.0 apixaban (ELIQUIS) 5 MG TABS tablet   CHA2DS2-VASc Score is 3.  Yearly risk of stroke: 3.2%.    I have extensively discussed with the patient again and try to convince him that he should be on anticoagulation in view of his cardioembolic risk.  But I also discussed with him regarding the bleeding complication associated with this.  The most concerning thing for the patient was actually the cost.  I also offered him medication assistance and we also discussed either warfarin as well.  Samples and discount card given to the patient, I will set him up to see me back in the office in 2 weeks.

## 2019-08-05 ENCOUNTER — Encounter: Payer: Self-pay | Admitting: Cardiology

## 2019-08-05 DIAGNOSIS — Z45018 Encounter for adjustment and management of other part of cardiac pacemaker: Secondary | ICD-10-CM | POA: Diagnosis not present

## 2019-08-05 DIAGNOSIS — Z950 Presence of cardiac pacemaker: Secondary | ICD-10-CM | POA: Diagnosis not present

## 2019-08-05 DIAGNOSIS — I442 Atrioventricular block, complete: Secondary | ICD-10-CM | POA: Diagnosis not present

## 2019-08-26 ENCOUNTER — Telehealth: Payer: Self-pay

## 2019-08-26 NOTE — Telephone Encounter (Signed)
Telephone encounter:  Reason for call: Left Ankle pain/swelling ; He just started taking eliquis few months ago   Usual provider: [place] office visit:   Next office visit:   Last hospitalization:    Current Outpatient Medications on File Prior to Visit  Medication Sig Dispense Refill  . apixaban (ELIQUIS) 5 MG TABS tablet Take 1 tablet (5 mg total) by mouth 2 (two) times daily. 60 tablet 3  . carvedilol (COREG) 6.25 MG tablet Take 1 tablet (6.25 mg total) by mouth 2 (two) times daily with a meal. 180 tablet 3  . ezetimibe (ZETIA) 10 MG tablet Take 1 tablet (10 mg total) by mouth daily. Continue to Take pravastatin in the nighttime 90 tablet 3  . omeprazole (PRILOSEC) 40 MG capsule Take 1 capsule (40 mg total) by mouth daily. 30 minutes before breakfast 90 capsule 3  . oxybutynin (DITROPAN-XL) 5 MG 24 hr tablet Take 1 tablet (5 mg total) by mouth at bedtime. 90 tablet 1  . pravastatin (PRAVACHOL) 20 MG tablet Take 1 tablet (20 mg total) by mouth daily. At night 90 tablet 3  . tamsulosin (FLOMAX) 0.4 MG CAPS capsule Take 2 capsules (0.8 mg total) by mouth daily after supper. 180 capsule 3   No current facility-administered medications on file prior to visit.

## 2019-08-26 NOTE — Telephone Encounter (Signed)
Unusual to be only in one ankle if coming from medication, and generally this is not a side effect of Eliquis. Not sure that this is the reason. Looks like he was supposed to come back to see Dr. Einar Gip in Sept and he didnt have an appt. Can reschedule this and they can further discuss.

## 2019-08-27 ENCOUNTER — Telehealth: Payer: Self-pay

## 2019-08-27 ENCOUNTER — Encounter: Payer: Self-pay | Admitting: Internal Medicine

## 2019-08-27 NOTE — Telephone Encounter (Signed)
See my chart message and schedule patient appt if desired   [place]

## 2019-08-27 NOTE — Telephone Encounter (Signed)
Copied from [place] [id number]. Topic: Appointment Scheduling - Scheduling Inquiry for Clinic >> Aug 27, 2019 12:04 PM [name] wrote: Reason for CRM: Patient would like to schedule an appt for this Wednesday or Thursday . Left ankle swelling. Call back number [phone]

## 2019-08-27 NOTE — Telephone Encounter (Signed)
Patient said that he has an appt with his cardiologist, Dr. Oralia Rud tomorrow morning at 9:30 am.  Pt said that he started on the  medication Eliquis about 2 months ago and thinks it may be his medication that is causing his ankle to swell.

## 2019-08-27 NOTE — Telephone Encounter (Signed)
LMOM for pt to call back and schedule an appt

## 2019-08-28 ENCOUNTER — Other Ambulatory Visit: Payer: Self-pay

## 2019-08-28 ENCOUNTER — Encounter: Payer: Self-pay | Admitting: Cardiology

## 2019-08-28 ENCOUNTER — Ambulatory Visit (INDEPENDENT_AMBULATORY_CARE_PROVIDER_SITE_OTHER): Payer: Medicare Other | Admitting: Cardiology

## 2019-08-28 VITALS — BP 127/89 | HR 70 | Temp 97.1°F | Ht 73.0 in | Wt 214.0 lb

## 2019-08-28 DIAGNOSIS — I10 Essential (primary) hypertension: Secondary | ICD-10-CM

## 2019-08-28 DIAGNOSIS — I480 Paroxysmal atrial fibrillation: Secondary | ICD-10-CM | POA: Diagnosis not present

## 2019-08-28 DIAGNOSIS — I2510 Atherosclerotic heart disease of native coronary artery without angina pectoris: Secondary | ICD-10-CM

## 2019-08-28 DIAGNOSIS — M25472 Effusion, left ankle: Secondary | ICD-10-CM

## 2019-08-28 NOTE — Patient Instructions (Signed)

## 2019-08-28 NOTE — Telephone Encounter (Signed)
Left message for patient to return call back. PEC may give information.  

## 2019-08-28 NOTE — Progress Notes (Signed)
Primary Physician/Referring:  McLean-Scocuzza, Nino Glow, MD  Patient ID: [name], male    DOB: Nov 17, 1945, 73 y.o.   MRN: [id number]  Chief Complaint  Patient presents with  . Edema  . Follow-up    HPI: [name]  is a 73 y.o. male  with hypertension, hyperlipidemia, coronary calcification by CT chest in 2015, unilateral kidney due to congenital absence of left kidney, complete heart block S/P St. Jude PM 2240 Dual chamber pacemaker implant 02/05/2014: Olin Pia, MD. He has history of known tolerance to statins, however on low dose of pravastatin, his lipids are improved significantly.  He recently had remote pacemaker alert which showed persistent atrial fibrillation/flutter On 06/24/2019, I started him on Eliquis, he developed mild discomfort and also swelling in his left ankle, left feet and asked her to be seen.  Patient does not remember any trauma.  He is concerned about cost of the Eliquis.  He is also requesting whether any procedures that medications can be added on to prevent atrial fibrillation so that he can come off of anticoagulation.  Past Medical History:  Diagnosis Date  . Atrophic kidney    left   . Cataract    left eye mild   . Chicken pox   . Degenerative disc disease, lumbar    L4/5 noted imaging  . Dyslipidemia   . Emphysema of lung ([place])    COPD/bronchitis noted on imaging former smoker [id number]  . Encounter for care of pacemaker 05/05/2019  . Gallstones   . GERD (gastroesophageal reflux disease)   . Granuloma of liver   . Hiatal hernia    small   . History of esophageal stricture    05/08/09  . Kidney stones   . LVH (left ventricular hypertrophy)    mild on imaging   . Solitary kidney    left kidney is atrophic     Past Surgical History:  Procedure Laterality Date  . FOOT SURGERY     right mortons neuroma   . PACEMAKER PLACEMENT     2015 Dr. Caryl Comes for AV block  . PERMANENT PACEMAKER INSERTION N/A 02/05/2014   Procedure: PERMANENT  PACEMAKER INSERTION;  Surgeon: Deboraha Sprang, MD;  Location: [facility] CATH LAB;  Service: Cardiovascular;  Laterality: N/A;    Social History   Socioeconomic History  . Marital status: Married    Spouse name: Not on file  . Number of children: 2  . Years of education: Not on file  . Highest education level: Not on file  Occupational History  . Not on file  Social Needs  . Financial resource strain: Not hard at all  . Food insecurity    Worry: Never true    Inability: Never true  . Transportation needs    Medical: No    Non-medical: No  Tobacco Use  . Smoking status: Former Smoker    Packs/day: 1.00    Years: 20.00    Pack years: 20.00    Types: Cigarettes    Quit date: 10/21/1991    Years since quitting: 27.8  . Smokeless tobacco: Never Used  . Tobacco comment: smoked [id number] <1 ppd no FH lung cancer   Substance and Sexual Activity  . Alcohol use: Yes    Comment: Drinks 2-3 ounces of liquor daily  . Drug use: No  . Sexual activity: Not on file  Lifestyle  . Physical activity    Days per week: 5 days    Minutes per session: 30  min  . Stress: Not at all  Relationships  . Social Herbalist on phone: Not on file    Gets together: Not on file    Attends religious service: Not on file    Active member of club or organization: Not on file    Attends meetings of clubs or organizations: Not on file    Relationship status: Not on file  . Intimate partner violence    Fear of current or ex partner: No    Emotionally abused: No    Physically abused: No    Forced sexual activity: No  Other Topics Concern  . Not on file  Social History Narrative   Used to be in the [place]    2 kids (son and daughter)   Married    Born [place] grew up in [place]    Works part time at [employer] smoker 1968 to 1998 < 1ppd no FH lung cancer    Drinks 2-3 small shots of liquor daily, no drugs, never chewed tobacco    Review of Systems  Constitution: Negative for chills,  decreased appetite, malaise/fatigue and weight gain.  Cardiovascular: Negative for dyspnea on exertion, leg swelling and syncope.  Endocrine: Negative for cold intolerance.  Hematologic/Lymphatic: Does not bruise/bleed easily.  Musculoskeletal: Positive for joint pain (occasional knee).  Gastrointestinal: Negative for abdominal pain, anorexia, change in bowel habit, hematochezia and melena.  Neurological: Negative for headaches and light-headedness.  Psychiatric/Behavioral: Negative for depression and substance abuse.  All other systems reviewed and are negative.  Objective  Blood pressure 127/89, pulse 70, temperature (!) 97.1 F (36.2 C), height 6\' 1"  (1.854 m), weight 214 lb (97.1 kg), SpO2 100 %. Body mass index is 28.23 kg/m.    Physical Exam  Constitutional: He appears well-developed and well-nourished. No distress.  HENT:  Head: Atraumatic.  Eyes: Conjunctivae are normal.  Neck: Neck supple. No JVD present. No thyromegaly present.  Cardiovascular: Normal rate, regular rhythm, normal heart sounds and intact distal pulses. Exam reveals no gallop.  No murmur heard. Pulmonary/Chest: Effort normal and breath sounds normal.  Pacemaker pocket noted in the left infraclavicular region  Abdominal: Soft. Bowel sounds are normal.  Musculoskeletal: Normal range of motion.  Neurological: He is alert.  Skin: Skin is warm and dry.  Psychiatric: He has a normal mood and affect.   Radiology: No results found.  Laboratory examination:    CMP Latest Ref Rng & Units 11/22/2018 05/11/2018 11/06/2017  Glucose 70 - 99 mg/dL 108(H) 141(H) 110(H)  BUN 6 - 23 mg/dL 17 13 21   Creatinine 0.40 - 1.50 mg/dL 1.11 1.09 1.20  Sodium 135 - 145 mEq/L 141 141 138  Potassium 3.5 - 5.1 mEq/L 4.3 4.2 3.8  Chloride 96 - 112 mEq/L 105 107 101  CO2 19 - 32 mEq/L 28 27 30   Calcium 8.4 - 10.5 mg/dL 9.2 9.1 9.0  Total Protein 6.0 - 8.3 g/dL 6.4 - 6.9  Total Bilirubin 0.2 - 1.2 mg/dL 0.8 - 1.2  Alkaline Phos 39  - 117 U/L 48 - 51  AST 0 - 37 U/L 24 - 16  ALT 0 - 53 U/L 30 - 16   CBC Latest Ref Rng & Units 05/24/2019 11/22/2018 11/06/2017  WBC 3.8 - 10.8 Thousand/uL 5.2 4.5 4.4  Hemoglobin 13.2 - 17.1 g/dL 14.7 14.9 15.1  Hematocrit 38.5 - 50.0 % 42.4 44.5 45.5  Platelets 140 - 400 Thousand/uL 161 137.0(L) 169.0   Lipid Panel  Component Value Date/Time   CHOL 186 11/22/2018 0817   TRIG 78.0 11/22/2018 0817   HDL 60.20 11/22/2018 0817   CHOLHDL 3 11/22/2018 0817   VLDL 15.6 11/22/2018 0817   LDLCALC 110 (H) 11/22/2018 0817   HEMOGLOBIN A1C Lab Results  Component Value Date   HGBA1C 5.6 05/11/2018   TSH Recent Labs    11/22/18 0817  TSH 1.40    PRN Meds:. There are no discontinued medications. Current Meds  Medication Sig  . apixaban (ELIQUIS) 5 MG TABS tablet Take 1 tablet (5 mg total) by mouth 2 (two) times daily.  . carvedilol (COREG) 6.25 MG tablet Take 1 tablet (6.25 mg total) by mouth 2 (two) times daily with a meal.  . ezetimibe (ZETIA) 10 MG tablet Take 1 tablet (10 mg total) by mouth daily. Continue to Take pravastatin in the nighttime  . omeprazole (PRILOSEC) 40 MG capsule Take 1 capsule (40 mg total) by mouth daily. 30 minutes before breakfast  . oxybutynin (DITROPAN-XL) 5 MG 24 hr tablet Take 1 tablet (5 mg total) by mouth at bedtime.  . pravastatin (PRAVACHOL) 20 MG tablet Take 1 tablet (20 mg total) by mouth daily. At night  . tamsulosin (FLOMAX) 0.4 MG CAPS capsule Take 2 capsules (0.8 mg total) by mouth daily after supper.    Cardiac Studies:   Abdominal aortic duplex 04/25/2017: Focal plaque noted in the proximal, mid and distal aorta. Clinical correlation is suggested. Aortic ectasia noted maximum measuring 2.9 x 2.91 x 2.88 cm. no AAA. Enlarged inferior vena cava suggests elevated central venous pressure. Consider rescreening in 5 years.    Echocardiogram 02/05/2014: Normal LV systolic function, EF 0000000, mild LVH.  Mild TR.  Assessment     ICD-10-CM    1. Paroxysmal atrial fibrillation (HCC)  I48.0    CHA2DS2-VASc Score is 3.  Yearly risk of stroke: 3.2%. (Age, HTN, Coronary atherosclerosis)  2. Essential hypertension  I10   3. Left ankle swelling  M25.472     Remote pacemaker check  6.16.20:  <1%, 7 AMS episodes, max 10secs - ATs. longevity is 8.7-9.5 years. RA pacing is 78 %, RV pacing is >99 %.  Remote pacemaker check  Unscheduled (Alert) 06/24/2019:  There was 1 atrial high rate episode lasting 01:17:37:24 in duration. EGM shows A. Flutter/fib. There was a 32 % cumulative atrial arrhythmia burden. Battery longevity is 8.8-9.3 years. RA pacing is 52.0 %, RV pacing is >99.0 %.  Scheduled In office pacemaker check 06/18/2019:  Underlying complete heart block. Pacer dependant. Normal thresholds. One brief mode switch, EGM show brief AT. Longevity 8.6 to 9.3 years.     EKG 07/11/2019: Probably AP rhythm with 1st degree AV Block. V paced rhythm @ 60/min. No further analysis.   Recommendations:   Patient was seen by me recently on 07/12/2019 remote transmission of pacemaker revealing sustained atrial fibrillation.  Patient was in sustained atrial fibrillation/atrial flutter for 24 hours or more, he was started on anticoagulation.  I'm seeing him now for left ankle swelling and bruising.  On exam he has mild 1-2+ pitting edema at the level of the foot and also there is mild ecchymosis.  I suspect he probably has a small arteriole on a capillary leak from a minor trauma.  There is no signs of inflammation.  We had a lengthy discussion regarding the need for anti-correlation.  His cardioembolic risk is a scalpel today 3 with risk factors being his age, hypertension and coronary artery calcification noted on the  CT scan.  Patient is worried about the cost of the medication.  Begin discussed regarding options of undergoing ablation, patient is leaning towards changing to Coumadin due to cost, I'll be happy to monitor his Coumadin levels.  Coumadin  clinic was discussed at length with the patient.  He'll further discuss this with his wife and get back to [place]. I have also discussed with him regarding the data with anticoagulation and also antiarrhythmic therapy and atrial fibrillation ablation.  Brief discussion regarding atrial fibrillation ablation discussed.  This does not negate the fact that patient will still need to be on anticoagulation.   I also offered him to get second opinion from Dr. Jolyn Nap (EP).  Adrian Prows, MD, [facility] 08/28/2019, 10:21 AM [facility]. [place] Pager: [phone]  Office: [phone] If no answer Cell [phone]

## 2019-09-05 ENCOUNTER — Other Ambulatory Visit: Payer: Self-pay | Admitting: Internal Medicine

## 2019-09-05 DIAGNOSIS — E785 Hyperlipidemia, unspecified: Secondary | ICD-10-CM

## 2019-09-05 DIAGNOSIS — I2510 Atherosclerotic heart disease of native coronary artery without angina pectoris: Secondary | ICD-10-CM

## 2019-09-05 MED ORDER — PRAVASTATIN SODIUM 20 MG PO TABS: 20.0000 mg | ORAL_TABLET | Freq: Every day | ORAL | 3 refills | Status: DC

## 2019-09-27 DIAGNOSIS — Z23 Encounter for immunization: Secondary | ICD-10-CM | POA: Diagnosis not present

## 2019-10-06 DIAGNOSIS — Z20828 Contact with and (suspected) exposure to other viral communicable diseases: Secondary | ICD-10-CM | POA: Diagnosis not present

## 2019-10-07 ENCOUNTER — Telehealth: Payer: Self-pay

## 2019-10-07 NOTE — Telephone Encounter (Signed)
This needs an appt  He can try mucinex dm green label otc sugar free cough drops otc   Schedule appt asap   He may need CXR and needs virtual appt or telephone visit this week for Rx meds

## 2019-10-07 NOTE — Telephone Encounter (Signed)
Copied from [place] [id number]. Topic: General - Other >> Oct 07, 2019  4:20 PM Leward Quan A wrote: Reason for CRM: Patient called to request an Rx for persistent cough and general weakness from Dr Olivia Mackie while he wait for his covid test result to come back. Ph# [phone]

## 2019-10-08 NOTE — Telephone Encounter (Signed)
For any medication or new issue he needs an appt  Please schedule him one or he can go to urgent care   [place]

## 2019-10-08 NOTE — Telephone Encounter (Signed)
Patient stated he doesn't want appointment. He was calling just to get prednisone like his wife received from her doctor. Informed patient he would need an appointment to receive medication. He stated he will await his results and take the mucinex and cough drops.

## 2019-10-08 NOTE — Telephone Encounter (Signed)
He stated he will await his results and take the mucinex and cough drops. Patient does not want appointment.

## 2019-10-09 ENCOUNTER — Other Ambulatory Visit: Payer: Self-pay

## 2019-10-09 DIAGNOSIS — Z20822 Contact with and (suspected) exposure to covid-19: Secondary | ICD-10-CM

## 2019-10-11 LAB — NOVEL CORONAVIRUS, NAA

## 2019-10-25 ENCOUNTER — Other Ambulatory Visit: Payer: Self-pay

## 2019-10-25 DIAGNOSIS — N3281 Overactive bladder: Secondary | ICD-10-CM

## 2019-10-25 MED ORDER — OXYBUTYNIN CHLORIDE ER 5 MG PO TB24: 5.0000 mg | ORAL_TABLET | Freq: Every day | ORAL | 1 refills | Status: DC

## 2019-11-15 ENCOUNTER — Other Ambulatory Visit: Payer: Self-pay | Admitting: Cardiology

## 2019-11-15 DIAGNOSIS — I480 Paroxysmal atrial fibrillation: Secondary | ICD-10-CM

## 2020-01-09 DIAGNOSIS — Z950 Presence of cardiac pacemaker: Secondary | ICD-10-CM | POA: Diagnosis not present

## 2020-01-09 DIAGNOSIS — I442 Atrioventricular block, complete: Secondary | ICD-10-CM | POA: Diagnosis not present

## 2020-01-09 DIAGNOSIS — Z45018 Encounter for adjustment and management of other part of cardiac pacemaker: Secondary | ICD-10-CM | POA: Diagnosis not present

## 2020-02-11 ENCOUNTER — Other Ambulatory Visit: Payer: Self-pay | Admitting: Internal Medicine

## 2020-02-11 DIAGNOSIS — K219 Gastro-esophageal reflux disease without esophagitis: Secondary | ICD-10-CM

## 2020-02-11 MED ORDER — OMEPRAZOLE 40 MG PO CPDR: 40.0000 mg | DELAYED_RELEASE_CAPSULE | Freq: Every day | ORAL | 0 refills | Status: DC

## 2020-02-24 ENCOUNTER — Other Ambulatory Visit: Payer: Self-pay

## 2020-02-24 ENCOUNTER — Encounter: Payer: Self-pay | Admitting: Cardiology

## 2020-02-24 ENCOUNTER — Ambulatory Visit: Payer: Medicare Other | Admitting: Cardiology

## 2020-02-24 VITALS — BP 137/85 | HR 62 | Temp 98.0°F | Resp 17 | Ht 73.0 in | Wt 210.5 lb

## 2020-02-24 DIAGNOSIS — I2510 Atherosclerotic heart disease of native coronary artery without angina pectoris: Secondary | ICD-10-CM | POA: Diagnosis not present

## 2020-02-24 DIAGNOSIS — E7800 Pure hypercholesterolemia, unspecified: Secondary | ICD-10-CM | POA: Diagnosis not present

## 2020-02-24 DIAGNOSIS — I480 Paroxysmal atrial fibrillation: Secondary | ICD-10-CM

## 2020-02-24 DIAGNOSIS — I10 Essential (primary) hypertension: Secondary | ICD-10-CM

## 2020-02-24 DIAGNOSIS — Z950 Presence of cardiac pacemaker: Secondary | ICD-10-CM

## 2020-02-24 NOTE — Progress Notes (Signed)
Primary Physician/Referring:  McLean-Scocuzza, Nino Glow, MD  Patient ID: [name], male    DOB: 05/04/1946, 74 y.o.   MRN: [id number]  Chief Complaint  Patient presents with  . Atrial Fibrillation  . Follow-up    6 month    HPI: [name]  is a 74 y.o. male  with hypertension, hyperlipidemia, coronary calcification by CT chest in 2015, unilateral kidney due to congenital absence of left kidney, complete heart block S/P St. Jude PM 2240 Dual chamber pacemaker implant 02/05/2014: Olin Pia, MD, paroxysmal atrial fibrillation noted on pacemaker transmission, has been on Eliquis since 06/24/2019. He has history of known tolerance to statins, however on low dose of pravastatin, his lipids are improved significantly.  Presently asymptomatic, tolerating all his medications well.  Denies chest pain or palpitations.  No bleeding diathesis on Eliquis.   Past Medical History:  Diagnosis Date  . Atrophic kidney    left   . Cataract    left eye mild   . Chicken pox   . Degenerative disc disease, lumbar    L4/5 noted imaging  . Dyslipidemia   . Emphysema of lung ([place])    COPD/bronchitis noted on imaging former smoker ([id number])  . Encounter for care of pacemaker 05/05/2019  . Gallstones   . GERD (gastroesophageal reflux disease)   . Granuloma of liver   . Hiatal hernia    small   . History of esophageal stricture    05/08/09  . Kidney stones   . LVH (left ventricular hypertrophy)    mild on imaging   . Solitary kidney    left kidney is atrophic     Past Surgical History:  Procedure Laterality Date  . FOOT SURGERY     right mortons neuroma   . PACEMAKER PLACEMENT     2015 Dr. Caryl Comes for AV block  . PERMANENT PACEMAKER INSERTION N/A 02/05/2014   Procedure: PERMANENT PACEMAKER INSERTION;  Surgeon: Deboraha Sprang, MD;  Location: [facility] CATH LAB;  Service: Cardiovascular;  Laterality: N/A;    Social History   Socioeconomic History  . Marital status: Married    Spouse name:  Not on file  . Number of children: 2  . Years of education: Not on file  . Highest education level: Not on file  Occupational History  . Not on file  Tobacco Use  . Smoking status: Former Smoker    Packs/day: 1.00    Years: 20.00    Pack years: 20.00    Types: Cigarettes    Quit date: 10/21/1991    Years since quitting: 28.3  . Smokeless tobacco: Never Used  . Tobacco comment: smoked [id number] <1 ppd no FH lung cancer   Substance and Sexual Activity  . Alcohol use: Yes    Comment: Drinks 2-3 ounces of liquor daily  . Drug use: No  . Sexual activity: Not on file  Other Topics Concern  . Not on file  Social History Narrative   Used to be in the [place]    2 kids (son and daughter)   Married    Born [place] grew up in [place]    Works part time at [employer] smoker 1968 to 1998 < 1ppd no FH lung cancer    Drinks 2-3 small shots of liquor daily, no drugs, never chewed tobacco    Social Determinants of Radio broadcast assistant Strain:   . Difficulty of Paying Living Expenses:   Food Insecurity:   .  Worried About Charity fundraiser in the Last Year:   . Arboriculturist in the Last Year:   Transportation Needs:   . Film/video editor (Medical):   Marland Kitchen Lack of Transportation (Non-Medical):   Physical Activity: Sufficiently Active  . Days of Exercise per Week: 5 days  . Minutes of Exercise per Session: 30 min  Stress: No Stress Concern Present  . Feeling of Stress : Not at all  Social Connections:   . Frequency of Communication with Friends and Family:   . Frequency of Social Gatherings with Friends and Family:   . Attends Religious Services:   . Active Member of Clubs or Organizations:   . Attends Archivist Meetings:   Marland Kitchen Marital Status:   Intimate Partner Violence:   . Fear of Current or Ex-Partner:   . Emotionally Abused:   Marland Kitchen Physically Abused:   . Sexually Abused:    Review of Systems  Cardiovascular: Negative for chest pain, dyspnea on  exertion and leg swelling.  Musculoskeletal: Positive for joint pain.  Gastrointestinal: Negative for melena.   Objective  Blood pressure 137/85, pulse 62, temperature 98 F (36.7 C), resp. rate 17, height '6\' 1"'  (1.854 m), weight 210 lb 8 oz (95.5 kg), SpO2 98 %. Body mass index is 27.77 kg/m.   Vitals with BMI 02/24/2020 08/28/2019 07/11/2019  Height '6\' 1"'  '6\' 1"'  '6\' 1"'   Weight 210 lbs 8 oz 214 lbs 216 lbs  BMI 27.78 35.82 51.8  Systolic 984 210 312  Diastolic 85 89 80  Pulse 62 70 61       Physical Exam  Cardiovascular: Normal rate, regular rhythm, normal heart sounds and intact distal pulses. Exam reveals no gallop.  No murmur heard. No leg edema, no JVD.  Pulmonary/Chest: Effort normal and breath sounds normal.  Pacemaker/ICD site noted  in the left infraclavicular fossa.    Abdominal: Soft. Bowel sounds are normal.   Radiology: No results found.  Laboratory examination:    CMP Latest Ref Rng & Units 11/22/2018 05/11/2018 11/06/2017  Glucose 70 - 99 mg/dL 108(H) 141(H) 110(H)  BUN 6 - 23 mg/dL '17 13 21  ' Creatinine 0.40 - 1.50 mg/dL 1.11 1.09 1.20  Sodium 135 - 145 mEq/L 141 141 138  Potassium 3.5 - 5.1 mEq/L 4.3 4.2 3.8  Chloride 96 - 112 mEq/L 105 107 101  CO2 19 - 32 mEq/L '28 27 30  ' Calcium 8.4 - 10.5 mg/dL 9.2 9.1 9.0  Total Protein 6.0 - 8.3 g/dL 6.4 - 6.9  Total Bilirubin 0.2 - 1.2 mg/dL 0.8 - 1.2  Alkaline Phos 39 - 117 U/L 48 - 51  AST 0 - 37 U/L 24 - 16  ALT 0 - 53 U/L 30 - 16   CBC Latest Ref Rng & Units 05/24/2019 11/22/2018 11/06/2017  WBC 3.8 - 10.8 Thousand/uL 5.2 4.5 4.4  Hemoglobin 13.2 - 17.1 g/dL 14.7 14.9 15.1  Hematocrit 38.5 - 50.0 % 42.4 44.5 45.5  Platelets 140 - 400 Thousand/uL 161 137.0(L) 169.0   Lipid Panel     Component Value Date/Time   CHOL 186 11/22/2018 0817   TRIG 78.0 11/22/2018 0817   HDL 60.20 11/22/2018 0817   CHOLHDL 3 11/22/2018 0817   VLDL 15.6 11/22/2018 0817   LDLCALC 110 (H) 11/22/2018 0817   HEMOGLOBIN A1C Lab  Results  Component Value Date   HGBA1C 5.6 05/11/2018   TSH No results for input(s): TSH in the last 8760 hours.  PRN  Meds:. There are no discontinued medications. Current Meds  Medication Sig  . Ascorbic Acid (VITAMIN C) 1000 MG tablet Take 1,000 mg by mouth daily.  . carvedilol (COREG) 6.25 MG tablet Take 1 tablet (6.25 mg total) by mouth 2 (two) times daily with a meal.  . Cholecalciferol (VITAMIN D3) 1.25 MG (50000 UT) TABS Take 1 tablet by mouth once a week.  Marland Kitchen ELIQUIS 5 MG TABS tablet TAKE 1 TABLET(5 MG) BY MOUTH TWICE DAILY  . ezetimibe (ZETIA) 10 MG tablet Take 1 tablet (10 mg total) by mouth daily. Continue to Take pravastatin in the nighttime  . omeprazole (PRILOSEC) 40 MG capsule Take 1 capsule (40 mg total) by mouth daily. 30 minutes before breakfast appt more refills  . oxybutynin (DITROPAN-XL) 5 MG 24 hr tablet Take 1 tablet (5 mg total) by mouth at bedtime.  . pravastatin (PRAVACHOL) 20 MG tablet Take 1 tablet (20 mg total) by mouth daily. At night  . tamsulosin (FLOMAX) 0.4 MG CAPS capsule Take 2 capsules (0.8 mg total) by mouth daily after supper.  . Zinc 50 MG CAPS Take 1 capsule by mouth daily.    Cardiac Studies:   Abdominal aortic duplex 04/25/2017: Focal plaque noted in the proximal, mid and distal aorta. Clinical correlation is suggested. Aortic ectasia noted maximum measuring 2.9 x 2.91 x 2.88 cm. no AAA. Enlarged inferior vena cava suggests elevated central venous pressure. Consider rescreening in 5 years.    Echocardiogram 02/05/2014: Normal LV systolic function, EF 82-99%, mild LVH.  Mild TR.  Scheduled Remote pacemaker check 01/09/2020: There were 12 atrial high rate episodes detected. The longer lasted 24 seconds in duration. Review of EGMs demonstrates paroxysmal atrial tachycardia. There was a <1 % cumulative atrial arrhythmia burden. There were 0 high ventricular rate episodes detected. Health trends do not demonstrate significant abnormality. Battery  longevity is 8.7 - 9.4 years. RA pacing is 76 %, RV pacing is >99 %.  EKG:  EKG 02/24/2020: AV paced rhythm at rate of 61 bpm, no further analysis.  Assessment     ICD-10-CM   1. Paroxysmal atrial fibrillation (HCC)  I48.0 EKG 12-Lead    Lipid Panel With LDL/HDL Ratio    CBC  2. Essential hypertension  I10 CMP14+EGFR    TSH  3. Pacemaker St. Jude PM  2240 Dual chamber pacemaker implant  02/05/2014  Z95.0   4. Hypercholesteremia  E78.00   5. Coronary artery calcification seen on CAT scan  I25.10       Recommendations:   [name]  is a 74 y.o. male  with hypertension, hyperlipidemia, coronary calcification by CT chest in 2015, unilateral kidney due to congenital absence of left kidney, complete heart block S/P St. Jude PM 2240 Dual chamber pacemaker implant 02/05/2014: Olin Pia, MD, paroxysmal atrial fibrillation noted on pacemaker transmission, has been on Eliquis since 06/24/2019. He has history of known tolerance to statins, however on low dose of pravastatin, his lipids are improved significantly.  He is essentially asymptomatic and has not had any recurrence of palpitations, dizziness or leg edema.  I discussed the findings of coronary calcification noted on CT scan in 2015, will it be aggressive with his LDL, he has not tolerated pravastatin and also Zetia, I will obtain lipid profile testing and consider increasing pravastatin dose.  Otherwise blood pressure is well controlled, he needs labs and CBC, CMP and TSH ordered and will carbon copy his PCP as well.  No changes in the medications were done today.  Pacemaker is functioning normally.  I will see her back in 1 year.  He is tolerating anticoagulation without bleeding diathesis.   Adrian Prows, MD, [facility] 02/24/2020, [facility] [place] Office: [phone] EKG 02/24/2020: AV paced rhythm, no further analysis.  EKG

## 2020-03-11 ENCOUNTER — Other Ambulatory Visit: Payer: Self-pay | Admitting: Cardiology

## 2020-03-11 DIAGNOSIS — I480 Paroxysmal atrial fibrillation: Secondary | ICD-10-CM

## 2020-03-26 ENCOUNTER — Ambulatory Visit (INDEPENDENT_AMBULATORY_CARE_PROVIDER_SITE_OTHER): Payer: Medicare Other

## 2020-03-26 ENCOUNTER — Ambulatory Visit: Payer: Medicare Other | Admitting: Internal Medicine

## 2020-03-26 ENCOUNTER — Ambulatory Visit: Payer: Medicare Other

## 2020-03-26 VITALS — Ht 73.0 in | Wt 210.0 lb

## 2020-03-26 DIAGNOSIS — Z0000 Encounter for general adult medical examination without abnormal findings: Secondary | ICD-10-CM

## 2020-03-26 DIAGNOSIS — Z1159 Encounter for screening for other viral diseases: Secondary | ICD-10-CM

## 2020-03-26 NOTE — Patient Instructions (Addendum)
  [name] , Thank you for taking time to come for your Medicare Wellness Visit. I appreciate your ongoing commitment to your health goals. Please review the following plan we discussed and let me know if I can assist you in the future.   These are the goals we discussed: Goals    . Healthy Diet     Low cholesterol       This is a list of the screening recommended for you and due dates:  Health Maintenance  Topic Date Due  .  Hepatitis C: One time screening is recommended by Center for Disease Control  (CDC) for  adults born from [birth year] through 1964.   Never done  . COVID-19 Vaccine (1) Never done  . Tetanus Vaccine  Never done  . Flu Shot  06/14/2020  . Colon Cancer Screening  01/23/2028  . Pneumonia vaccines  Completed

## 2020-03-26 NOTE — Progress Notes (Addendum)
Subjective:   [name] is a 74 y.o. male who presents for Medicare Annual/Subsequent preventive examination.  Review of Systems:  No ROS.  Medicare Wellness Virtual Visit.  Visual/audio telehealth visit, UTA vital signs.   Ht/Wt provided.  See social history for additional risk factors.   Cardiac Risk Factors include: advanced age (>23men, >50 women);hypertension;male gender     Objective:    Vitals: Ht 6\' 1"  (1.854 m)   Wt 210 lb (95.3 kg)   BMI 27.71 kg/m   Body mass index is 27.71 kg/m.  Advanced Directives 03/26/2020 03/26/2019 03/23/2018 02/05/2014  Does Patient Have a Medical Advance Directive? No No No Patient does not have advance directive;Patient would like information  Would patient like information on creating a medical advance directive? Yes (MAU/Ambulatory/Procedural Areas - Information given) Yes (MAU/Ambulatory/Procedural Areas - Information given) No - Patient declined Advance directive packet given    Tobacco Social History   Tobacco Use  Smoking Status Former Smoker  . Packs/day: 1.00  . Years: 20.00  . Pack years: 20.00  . Types: Cigarettes  . Quit date: 10/21/1991  . Years since quitting: 28.4  Smokeless Tobacco Never Used  Tobacco Comment   smoked [id number] <1 ppd no FH lung cancer      Counseling given: Not Answered Comment: smoked [id number] <1 ppd no FH lung cancer    Clinical Intake:  Pre-visit preparation completed: Yes        Diabetes: No  How often do you need to have someone help you when you read instructions, pamphlets, or other written materials from your doctor or pharmacy?: 1 - Never  Interpreter Needed?: No     Past Medical History:  Diagnosis Date  . Atrophic kidney    left   . Cataract    left eye mild   . Chicken pox   . Degenerative disc disease, lumbar    L4/5 noted imaging  . Dyslipidemia   . Emphysema of lung ([place])    COPD/bronchitis noted on imaging former smoker [id number]  . Encounter for care  of pacemaker 05/05/2019  . Gallstones   . GERD (gastroesophageal reflux disease)   . Granuloma of liver   . Hiatal hernia    small   . History of esophageal stricture    05/08/09  . Kidney stones   . LVH (left ventricular hypertrophy)    mild on imaging   . Solitary kidney    left kidney is atrophic    Past Surgical History:  Procedure Laterality Date  . FOOT SURGERY     right mortons neuroma   . PACEMAKER PLACEMENT     2015 Dr. Caryl Comes for AV block  . PERMANENT PACEMAKER INSERTION N/A 02/05/2014   Procedure: PERMANENT PACEMAKER INSERTION;  Surgeon: Deboraha Sprang, MD;  Location: [facility] CATH LAB;  Service: Cardiovascular;  Laterality: N/A;   Family History  Problem Relation Age of Onset  . Diabetes Mother        died of complications   . Cancer Father        bladder   . Stroke Father    Social History   Socioeconomic History  . Marital status: Married    Spouse name: Not on file  . Number of children: 2  . Years of education: Not on file  . Highest education level: Not on file  Occupational History  . Not on file  Tobacco Use  . Smoking status: Former Smoker    Packs/day: 1.00  Years: 20.00    Pack years: 20.00    Types: Cigarettes    Quit date: 10/21/1991    Years since quitting: 28.4  . Smokeless tobacco: Never Used  . Tobacco comment: smoked [id number] <1 ppd no FH lung cancer   Substance and Sexual Activity  . Alcohol use: Yes    Comment: Drinks 2-3 ounces of liquor daily  . Drug use: No  . Sexual activity: Not on file  Other Topics Concern  . Not on file  Social History Narrative   Used to be in the [employer]    2 kids (son and daughter)   Married    Born [place] grew up in [place]    Works part time at [employer] smoker 1968 to 1998 < 1ppd no FH lung cancer    Drinks 2-3 small shots of liquor daily, no drugs, never chewed tobacco    Social Determinants of Radio broadcast assistant Strain:   . Difficulty of Paying Living Expenses:   Food  Insecurity:   . Worried About Charity fundraiser in the Last Year:   . Arboriculturist in the Last Year:   Transportation Needs:   . Film/video editor (Medical):   Marland Kitchen Lack of Transportation (Non-Medical):   Physical Activity:   . Days of Exercise per Week:   . Minutes of Exercise per Session:   Stress:   . Feeling of Stress :   Social Connections:   . Frequency of Communication with Friends and Family:   . Frequency of Social Gatherings with Friends and Family:   . Attends Religious Services:   . Active Member of Clubs or Organizations:   . Attends Archivist Meetings:   Marland Kitchen Marital Status:     Outpatient Encounter Medications as of 03/26/2020  Medication Sig  . Ascorbic Acid (VITAMIN C) 1000 MG tablet Take 1,000 mg by mouth daily.  . carvedilol (COREG) 6.25 MG tablet Take 1 tablet (6.25 mg total) by mouth 2 (two) times daily with a meal.  . Cholecalciferol (VITAMIN D3) 1.25 MG (50000 UT) TABS Take 1 tablet by mouth once a week.  Marland Kitchen ELIQUIS 5 MG TABS tablet TAKE 1 TABLET(5 MG) BY MOUTH TWICE DAILY  . ezetimibe (ZETIA) 10 MG tablet Take 1 tablet (10 mg total) by mouth daily. Continue to Take pravastatin in the nighttime  . omeprazole (PRILOSEC) 40 MG capsule Take 1 capsule (40 mg total) by mouth daily. 30 minutes before breakfast appt more refills  . oxybutynin (DITROPAN-XL) 5 MG 24 hr tablet Take 1 tablet (5 mg total) by mouth at bedtime.  . pravastatin (PRAVACHOL) 20 MG tablet Take 1 tablet (20 mg total) by mouth daily. At night  . tamsulosin (FLOMAX) 0.4 MG CAPS capsule Take 2 capsules (0.8 mg total) by mouth daily after supper.  . Zinc 50 MG CAPS Take 1 capsule by mouth daily.   No facility-administered encounter medications on file as of 03/26/2020.    Activities of Daily Living In your present state of health, do you have any difficulty performing the following activities: 03/26/2020  Hearing? N  Vision? N  Difficulty concentrating or making decisions? N    Walking or climbing stairs? N  Dressing or bathing? N  Doing errands, shopping? N  Preparing Food and eating ? N  Using the Toilet? N  In the past six months, have you accidently leaked urine? N  Do you have problems with loss of bowel  control? N  Managing your Medications? N  Managing your Finances? N  Housekeeping or managing your Housekeeping? N  Some recent data might be hidden    Patient Care Team: McLean-Scocuzza, Nino Glow, MD as PCP - General (Internal Medicine)   Assessment:   This is a routine wellness examination for Krystian.  Nurse connected with patient 03/26/20 at  1:30 PM EDT by a telephone enabled telemedicine application, patient has no access to video or technical difficulty with video and verified that I am speaking with the correct person using two identifiers. Patient stated full name and DOB. Patient gave permission to continue with telephone visit. Patient's location was at home and Nurse's location was at [place] office.   Patient is alert and oriented x3. Patient denies difficulty focusing or concentrating. 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   Health Maintenance Due: -Tdap vaccine- discussed; to be completed with doctor in visit or local pharmacy.   -Covid vaccine- states series is completed, bring information next office visit.  -Hep C screening- discussed; consent given. See completed HM at the end of note.   Eye: Visual acuity not assessed. Virtual visit. Followed by their ophthalmologist.  Dental: UTD   Hearing: Demonstrates normal hearing during visit.  Safety:  Patient feels safe at home- yes Patient does have smoke detectors at home- yes Patient does wear sunscreen or protective clothing when in direct sunlight - yes Patient does wear seat belt when in a moving vehicle - yes Patient drives- yes Adequate lighting in walkways free from debris- yes Grab bars and handrails used as appropriate- yes Ambulates  with an assistive device- no  Cell phone on person when ambulating outside of the home-yes  Social: Alcohol intake - yes      Smoking history- former   Smokers in home? none Illicit drug use? none  Medication: Taking as directed and without issues.  Pill box in use -yes  Self managed - yes   Covid-19: Precautions and sickness symptoms discussed. Wears mask, social distancing, hand hygiene as appropriate.   Activities of Daily Living Patient denies needing assistance with: household chores, feeding themselves, getting from bed to chair, getting to the toilet, bathing/showering, dressing, managing money, or preparing meals.   Discussed the importance of a healthy diet, water intake and the benefits of aerobic exercise.   Physical activity- no routine, active inside/outside the home. Encouraged to stay active.   Diet:  Regular, he tries to have a healthy diet Water: fair intake Caffeine: 1 cup of coffee  Other Providers Patient Care Team: McLean-Scocuzza, Nino Glow, MD as PCP - General (Internal Medicine)  Exercise Activities and Dietary recommendations Current Exercise Habits: Home exercise routine  Goals    . Healthy Diet     Low cholesterol       Fall Risk Fall Risk  03/26/2020 03/26/2019 09/28/2018 03/23/2018  Falls in the past year? 0 0 0 No  Follow up Falls evaluation completed - - -   Timed Get Up and Go Performed: no, virtual visit  Depression Screen PHQ 2/9 Scores 03/26/2020 03/26/2019 09/28/2018 03/23/2018  PHQ - 2 Score 0 0 0 0    Cognitive Function MMSE - Mini Mental State Exam 03/23/2018  Orientation to time 5  Orientation to Place 5  Registration 3  Attention/ Calculation 5  Recall 3  Language- name 2 objects 2  Language- repeat 1  Language- follow 3 step command 3  Language- read & follow direction 1  Write a sentence 1  Copy design 1  Total score 30     6CIT Screen 03/26/2020 03/26/2019  What Year? 0 points 0 points  What month? 0 points 0  points  What time? 0 points 0 points  Count back from 20 - 0 points  Months in reverse - 0 points  Repeat phrase - 0 points  Total Score - 0    Immunization History  Administered Date(s) Administered  . Influenza, High Dose Seasonal PF 09/28/2018  . Influenza-Unspecified 08/23/2017  . Pneumococcal Conjugate-13 10/20/2017  . Pneumococcal Polysaccharide-23 11/22/2018   Screening Tests Health Maintenance  Topic Date Due  . Hepatitis C Screening  Never done  . COVID-19 Vaccine (1) Never done  . TETANUS/TDAP  Never done  . INFLUENZA VACCINE  06/14/2020  . COLONOSCOPY  01/23/2028  . PNA vac Low Risk Adult  Completed       Plan:   Keep all routine maintenance appointments.   Follow up 04/14/20 @ 1:30  Covid vaccine- states series is completed, bring information next office visit.   Hepatitis C Screening- consent given, future lab ordered  Medicare Attestation I have personally reviewed: The patient's medical and social history Their use of alcohol, tobacco or illicit drugs Their current medications and supplements The patient's functional ability including ADLs,fall risks, home safety risks, cognitive, and hearing and visual impairment Diet and physical activities Evidence for depression   I have reviewed and discussed with patient certain preventive protocols, quality metrics, and best practice recommendations.      Varney Biles, LPN  [id number]  Agree  TMS

## 2020-04-09 DIAGNOSIS — I442 Atrioventricular block, complete: Secondary | ICD-10-CM | POA: Diagnosis not present

## 2020-04-09 DIAGNOSIS — Z45018 Encounter for adjustment and management of other part of cardiac pacemaker: Secondary | ICD-10-CM | POA: Diagnosis not present

## 2020-04-09 DIAGNOSIS — Z950 Presence of cardiac pacemaker: Secondary | ICD-10-CM | POA: Diagnosis not present

## 2020-04-14 ENCOUNTER — Ambulatory Visit: Payer: Medicare Other | Admitting: Internal Medicine

## 2020-04-15 ENCOUNTER — Encounter: Payer: Self-pay | Admitting: Orthopaedic Surgery

## 2020-04-15 ENCOUNTER — Other Ambulatory Visit: Payer: Self-pay

## 2020-04-15 ENCOUNTER — Ambulatory Visit (INDEPENDENT_AMBULATORY_CARE_PROVIDER_SITE_OTHER): Payer: Medicare Other

## 2020-04-15 ENCOUNTER — Ambulatory Visit (INDEPENDENT_AMBULATORY_CARE_PROVIDER_SITE_OTHER): Payer: Medicare Other | Admitting: Orthopaedic Surgery

## 2020-04-15 DIAGNOSIS — M7061 Trochanteric bursitis, right hip: Secondary | ICD-10-CM | POA: Diagnosis not present

## 2020-04-15 DIAGNOSIS — I2510 Atherosclerotic heart disease of native coronary artery without angina pectoris: Secondary | ICD-10-CM

## 2020-04-15 DIAGNOSIS — M25551 Pain in right hip: Secondary | ICD-10-CM | POA: Diagnosis not present

## 2020-04-15 MED ORDER — LIDOCAINE HCL 1 % IJ SOLN
3.0000 mL | INTRAMUSCULAR | Status: AC | PRN
  Administered 2020-04-15: 3 mL

## 2020-04-15 MED ORDER — METHYLPREDNISOLONE ACETATE 40 MG/ML IJ SUSP
40.0000 mg | INTRAMUSCULAR | Status: AC | PRN
  Administered 2020-04-15: 40 mg via INTRA_ARTICULAR

## 2020-04-15 NOTE — Progress Notes (Signed)
Office Visit Note   Patient: [name]           Date of Birth: 1946/01/11           MRN: [id number] Visit Date: 04/15/2020              Requested by: McLean-Scocuzza, Nino Glow, MD [place],  [place] [address] PCP: McLean-Scocuzza, Nino Glow, MD   Assessment & Plan: Visit Diagnoses:  1. Pain in right hip   2. Trochanteric bursitis, right hip     Plan: The signs and symptoms are consistent with right hip trochanteric bursitis.  I recommended a steroid injection in this area.  He agreed with this and tolerated well.  Also recommended stretching exercises which I demonstrated and he demonstrated back to me.  I do feel some of this is indirectly related to his right knee and how he avoids walking different ways to protect the knee.  All questions and concerns were answered addressed.  If this flares up at all he will let us know.  Follow-Up Instructions: Return if symptoms worsen or fail to improve.   Orders:  Orders Placed This Encounter  Procedures  . Large Joint Inj  . XR HIP UNILAT W OR W/O PELVIS 1V RIGHT   No orders of the defined types were placed in this encounter.     Procedures: Large Joint Inj: R greater trochanter on 04/15/2020 1:43 PM Indications: pain and diagnostic evaluation Details: 22 G 1.5 in needle, lateral approach  Arthrogram: No  Medications: 3 mL lidocaine 1 %; 40 mg methylPREDNISolone acetate 40 MG/ML Outcome: tolerated well, no immediate complications Procedure, treatment alternatives, risks and benefits explained, specific risks discussed. Consent was given by the patient. Immediately prior to procedure a time out was called to verify the correct patient, procedure, equipment, support staff and site/side marked as required. Patient was prepped and draped in the usual sterile fashion.       Clinical Data: No additional findings.   Subjective: Chief Complaint  Patient presents with  . Right Hip - Pain  The patient is a very  pleasant 74 year old seen before.  He has a new chief complaint of right hip pain.  He has been dealing with his right knee for some time.  I placed at least a steroid and a gel injection in the right knee.  His right hip hurts on the lateral aspect of it.  He denies any injury.  It has been hurting for several weeks now.  He says the knee does flareup from time to time.  He is never had any injury to the hip and denies any groin pain either.  He is not a diabetic.  HPI  Review of Systems He currently denies any headache, chest pain, shortness of breath, fever, chills, nausea, vomiting  Objective: Vital Signs: There were no vitals taken for this visit.  Physical Exam He is alert and orient x3 and in no acute distress Ortho Exam Examination of his right hip shows full and fluid range of motion around the hip and knee.  He has pain only to palpation of the trochanteric area.  He has negative straight leg raise. Specialty Comments:  No specialty comments available.  Imaging: XR HIP UNILAT W OR W/O PELVIS 1V RIGHT  Result Date: 04/15/2020 An AP pelvis and lateral right hip shows a normal-appearing right hip with no acute findings.    PMFS History: Patient Active Problem List   Diagnosis Date Noted  .  Encounter for care of pacemaker 05/05/2019  . Pacemaker St. Jude PM  2240 Dual chamber pacemaker implant  02/05/2014 04/01/2019  . History of kidney stones 03/26/2019  . Malignant melanoma in situ ([place]) 05/01/2018  . HLD (hyperlipidemia) 03/23/2018  . Multiple nevi 03/23/2018  . Overactive bladder 11/16/2017  . HTN (hypertension) 10/20/2017  . BPH (benign prostatic hyperplasia) 10/20/2017  . Impingement syndrome of right shoulder 03/08/2017  . Coronary artery calcification 02/07/2014  . Solitary kidney 02/05/2014  . GERD (gastroesophageal reflux disease) 02/05/2014  . Dyslipidemia 02/05/2014  . Atrioventricular block, complete ([place]) 02/05/2014   Past Medical History:  Diagnosis Date   . Atrophic kidney    left   . Cataract    left eye mild   . Chicken pox   . Degenerative disc disease, lumbar    L4/5 noted imaging  . Dyslipidemia   . Emphysema of lung ([place])    COPD/bronchitis noted on imaging former smoker [id number]  . Encounter for care of pacemaker 05/05/2019  . Gallstones   . GERD (gastroesophageal reflux disease)   . Granuloma of liver   . Hiatal hernia    small   . History of esophageal stricture    05/08/09  . Kidney stones   . LVH (left ventricular hypertrophy)    mild on imaging   . Solitary kidney    left kidney is atrophic     Family History  Problem Relation Age of Onset  . Diabetes Mother        died of complications   . Cancer Father        bladder   . Stroke Father     Past Surgical History:  Procedure Laterality Date  . FOOT SURGERY     right mortons neuroma   . PACEMAKER PLACEMENT     2015 Dr. Caryl Comes for AV block  . PERMANENT PACEMAKER INSERTION N/A 02/05/2014   Procedure: PERMANENT PACEMAKER INSERTION;  Surgeon: Deboraha Sprang, MD;  Location: [facility] CATH LAB;  Service: Cardiovascular;  Laterality: N/A;   Social History   Occupational History  . Not on file  Tobacco Use  . Smoking status: Former Smoker    Packs/day: 1.00    Years: 20.00    Pack years: 20.00    Types: Cigarettes    Quit date: 10/21/1991    Years since quitting: 28.5  . Smokeless tobacco: Never Used  . Tobacco comment: smoked [id number] <1 ppd no FH lung cancer   Substance and Sexual Activity  . Alcohol use: Yes    Comment: Drinks 2-3 ounces of liquor daily  . Drug use: No  . Sexual activity: Not on file

## 2020-04-22 ENCOUNTER — Other Ambulatory Visit: Payer: Self-pay | Admitting: Internal Medicine

## 2020-04-22 DIAGNOSIS — K219 Gastro-esophageal reflux disease without esophagitis: Secondary | ICD-10-CM

## 2020-04-22 DIAGNOSIS — N3281 Overactive bladder: Secondary | ICD-10-CM

## 2020-04-22 MED ORDER — OXYBUTYNIN CHLORIDE ER 5 MG PO TB24: 5.0000 mg | ORAL_TABLET | Freq: Every day | ORAL | 1 refills | Status: DC

## 2020-04-22 MED ORDER — OMEPRAZOLE 40 MG PO CPDR: 40.0000 mg | DELAYED_RELEASE_CAPSULE | Freq: Every day | ORAL | 1 refills | Status: DC

## 2020-04-27 ENCOUNTER — Other Ambulatory Visit: Payer: Self-pay

## 2020-04-27 DIAGNOSIS — I480 Paroxysmal atrial fibrillation: Secondary | ICD-10-CM

## 2020-04-27 MED ORDER — APIXABAN 5 MG PO TABS: ORAL_TABLET | ORAL | 1 refills | Status: DC

## 2020-04-30 ENCOUNTER — Other Ambulatory Visit: Payer: Self-pay | Admitting: Internal Medicine

## 2020-04-30 DIAGNOSIS — E785 Hyperlipidemia, unspecified: Secondary | ICD-10-CM

## 2020-04-30 MED ORDER — EZETIMIBE 10 MG PO TABS: 10.0000 mg | ORAL_TABLET | Freq: Every day | ORAL | 1 refills | Status: DC

## 2020-05-01 ENCOUNTER — Ambulatory Visit (INDEPENDENT_AMBULATORY_CARE_PROVIDER_SITE_OTHER): Payer: Medicare Other | Admitting: Internal Medicine

## 2020-05-01 ENCOUNTER — Other Ambulatory Visit: Payer: Self-pay

## 2020-05-01 ENCOUNTER — Other Ambulatory Visit: Payer: Self-pay | Admitting: Internal Medicine

## 2020-05-01 ENCOUNTER — Encounter: Payer: Self-pay | Admitting: Internal Medicine

## 2020-05-01 VITALS — BP 130/84 | HR 60 | Temp 97.3°F | Ht 73.0 in | Wt 207.2 lb

## 2020-05-01 DIAGNOSIS — I10 Essential (primary) hypertension: Secondary | ICD-10-CM

## 2020-05-01 DIAGNOSIS — N401 Enlarged prostate with lower urinary tract symptoms: Secondary | ICD-10-CM

## 2020-05-01 DIAGNOSIS — E785 Hyperlipidemia, unspecified: Secondary | ICD-10-CM | POA: Diagnosis not present

## 2020-05-01 DIAGNOSIS — M7071 Other bursitis of hip, right hip: Secondary | ICD-10-CM

## 2020-05-01 DIAGNOSIS — R350 Frequency of micturition: Secondary | ICD-10-CM

## 2020-05-01 DIAGNOSIS — D696 Thrombocytopenia, unspecified: Secondary | ICD-10-CM

## 2020-05-01 DIAGNOSIS — Z125 Encounter for screening for malignant neoplasm of prostate: Secondary | ICD-10-CM

## 2020-05-01 DIAGNOSIS — N3281 Overactive bladder: Secondary | ICD-10-CM

## 2020-05-01 DIAGNOSIS — R3912 Poor urinary stream: Secondary | ICD-10-CM

## 2020-05-01 DIAGNOSIS — I2510 Atherosclerotic heart disease of native coronary artery without angina pectoris: Secondary | ICD-10-CM

## 2020-05-01 DIAGNOSIS — N1330 Unspecified hydronephrosis: Secondary | ICD-10-CM

## 2020-05-01 DIAGNOSIS — K769 Liver disease, unspecified: Secondary | ICD-10-CM

## 2020-05-01 DIAGNOSIS — K219 Gastro-esophageal reflux disease without esophagitis: Secondary | ICD-10-CM

## 2020-05-01 DIAGNOSIS — R160 Hepatomegaly, not elsewhere classified: Secondary | ICD-10-CM

## 2020-05-01 LAB — COMPREHENSIVE METABOLIC PANEL
ALT: 17 U/L (ref 0–53)
AST: 18 U/L (ref 0–37)
Albumin: 4.4 g/dL (ref 3.5–5.2)
Alkaline Phosphatase: 66 U/L (ref 39–117)
BUN: 14 mg/dL (ref 6–23)
CO2: 29 mEq/L (ref 19–32)
Calcium: 9.4 mg/dL (ref 8.4–10.5)
Chloride: 105 mEq/L (ref 96–112)
Creatinine, Ser: 0.95 mg/dL (ref 0.40–1.50)
GFR: 77.76 mL/min (ref >60.00)
Glucose, Bld: 102 mg/dL — ABNORMAL HIGH (ref 70–99)
Potassium: 4.3 mEq/L (ref 3.5–5.1)
Sodium: 138 mEq/L (ref 135–145)
Total Bilirubin: 1.1 mg/dL (ref 0.2–1.2)
Total Protein: 6.7 g/dL (ref 6.0–8.3)

## 2020-05-01 LAB — CBC WITH DIFFERENTIAL/PLATELET
Basophils Absolute: 0 10*3/uL (ref 0.0–0.1)
Basophils Relative: 0.8 % (ref 0.0–3.0)
Eosinophils Absolute: 0.2 10*3/uL (ref 0.0–0.7)
Eosinophils Relative: 3.6 % (ref 0.0–5.0)
HCT: 42.8 % (ref 39.0–52.0)
Hemoglobin: 14.5 g/dL (ref 13.0–17.0)
Lymphocytes Relative: 26.3 % (ref 12.0–46.0)
Lymphs Abs: 1.4 10*3/uL (ref 0.7–4.0)
MCHC: 33.9 g/dL (ref 30.0–36.0)
MCV: 89.1 fl (ref 78.0–100.0)
Monocytes Absolute: 0.4 10*3/uL (ref 0.1–1.0)
Monocytes Relative: 6.8 % (ref 3.0–12.0)
Neutro Abs: 3.3 10*3/uL (ref 1.4–7.7)
Neutrophils Relative %: 62.5 % (ref 43.0–77.0)
Platelets: 148 10*3/uL — ABNORMAL LOW (ref 150.0–400.0)
RBC: 4.81 Mil/uL (ref 4.22–5.81)
RDW: 14.6 % (ref 11.5–15.5)
WBC: 5.2 10*3/uL (ref 4.0–10.5)

## 2020-05-01 LAB — LIPID PANEL
Cholesterol: 155 mg/dL (ref 0–200)
HDL: 64.8 mg/dL (ref >39.00)
LDL Cholesterol: 76 mg/dL (ref 0–99)
NonHDL: 90.33
Total CHOL/HDL Ratio: 2
Triglycerides: 71 mg/dL (ref 0.0–149.0)
VLDL: 14.2 mg/dL (ref 0.0–40.0)

## 2020-05-01 LAB — PSA: PSA: 0.18 ng/mL (ref 0.10–4.00)

## 2020-05-01 MED ORDER — OXYBUTYNIN CHLORIDE ER 5 MG PO TB24: 5.0000 mg | ORAL_TABLET | Freq: Every day | ORAL | 3 refills | Status: DC

## 2020-05-01 MED ORDER — TAMSULOSIN HCL 0.4 MG PO CAPS: 0.8000 mg | ORAL_CAPSULE | Freq: Every day | ORAL | 3 refills | Status: DC

## 2020-05-01 MED ORDER — PRAVASTATIN SODIUM 40 MG PO TABS: 40.0000 mg | ORAL_TABLET | Freq: Every day | ORAL | 3 refills | Status: DC

## 2020-05-01 MED ORDER — CARVEDILOL 6.25 MG PO TABS: 6.2500 mg | ORAL_TABLET | Freq: Two times a day (BID) | ORAL | 3 refills | Status: DC

## 2020-05-01 MED ORDER — OMEPRAZOLE 40 MG PO CPDR: 40.0000 mg | DELAYED_RELEASE_CAPSULE | Freq: Every day | ORAL | 3 refills | Status: DC

## 2020-05-01 NOTE — Patient Instructions (Addendum)
Please call dermatology   High Cholesterol  High cholesterol is a condition in which the blood has high levels of a white, waxy, fat-like substance (cholesterol). The human body needs small amounts of cholesterol. The liver makes all the cholesterol that the body needs. Extra (excess) cholesterol comes from the food that we eat. Cholesterol is carried from the liver by the blood through the blood vessels. If you have high cholesterol, deposits (plaques) may build up on the walls of your blood vessels (arteries). Plaques make the arteries narrower and stiffer. Cholesterol plaques increase your risk for heart attack and stroke. Work with your health care provider to keep your cholesterol levels in a healthy range. What increases the risk? This condition is more likely to develop in people who:  Eat foods that are high in animal fat (saturated fat) or cholesterol.  Are overweight.  Are not getting enough exercise.  Have a family history of high cholesterol. What are the signs or symptoms? There are no symptoms of this condition. How is this diagnosed? This condition may be diagnosed from the results of a blood test.  If you are older than age 69, your health care provider may check your cholesterol every 4-6 years.  You may be checked more often if you already have high cholesterol or other risk factors for heart disease. The blood test for cholesterol measures:  "Bad" cholesterol (LDL cholesterol). This is the main type of cholesterol that causes heart disease. The desired level for LDL is less than 100.  "Good" cholesterol (HDL cholesterol). This type helps to protect against heart disease by cleaning the arteries and carrying the LDL away. The desired level for HDL is 60 or higher.  Triglycerides. These are fats that the body can store or burn for energy. The desired number for triglycerides is lower than 150.  Total cholesterol. This is a measure of the total amount of cholesterol  in your blood, including LDL cholesterol, HDL cholesterol, and triglycerides. A healthy number is less than 200. How is this treated? This condition is treated with diet changes, lifestyle changes, and medicines. Diet changes  This may include eating more whole grains, fruits, vegetables, nuts, and fish.  This may also include cutting back on red meat and foods that have a lot of added sugar. Lifestyle changes  Changes may include getting at least 40 minutes of aerobic exercise 3 times a week. Aerobic exercises include walking, biking, and swimming. Aerobic exercise along with a healthy diet can help you maintain a healthy weight.  Changes may also include quitting smoking. Medicines  Medicines are usually given if diet and lifestyle changes have failed to reduce your cholesterol to healthy levels.  Your health care provider may prescribe a statin medicine. Statin medicines have been shown to reduce cholesterol, which can reduce the risk of heart disease. Follow these instructions at home: Eating and drinking If told by your health care provider:  Eat chicken (without skin), fish, veal, shellfish, ground [place] breast, and round or loin cuts of red meat.  Do not eat fried foods or fatty meats, such as hot dogs and salami.  Eat plenty of fruits, such as apples.  Eat plenty of vegetables, such as broccoli, potatoes, and carrots.  Eat beans, peas, and lentils.  Eat grains such as barley, rice, couscous, and bulgur wheat.  Eat pasta without cream sauces.  Use skim or nonfat milk, and eat low-fat or nonfat yogurt and cheeses.  Do not eat or drink whole milk,  cream, ice cream, egg yolks, or hard cheeses.  Do not eat stick margarine or tub margarines that contain trans fats (also called partially hydrogenated oils).  Do not eat saturated tropical oils, such as coconut oil and palm oil.  Do not eat cakes, cookies, crackers, or other baked goods that contain trans fats.  General  instructions  Exercise as directed by your health care provider. Increase your activity level with activities such as gardening, walking, and taking the stairs.  Take over-the-counter and prescription medicines only as told by your health care provider.  Do not use any products that contain nicotine or tobacco, such as cigarettes and e-cigarettes. If you need help quitting, ask your health care provider.  Keep all follow-up visits as told by your health care provider. This is important. Contact a health care provider if:  You are struggling to maintain a healthy diet or weight.  You need help to start on an exercise program.  You need help to stop smoking. Get help right away if:  You have chest pain.  You have trouble breathing. This information is not intended to replace advice given to you by your health care provider. Make sure you discuss any questions you have with your health care provider. Document Revised: 11/03/2017 Document Reviewed: 04/30/2016 Elsevier Patient Education  [place].   Cholesterol Content in Foods Cholesterol is a waxy, fat-like substance that helps to carry fat in the blood. The body needs cholesterol in small amounts, but too much cholesterol can cause damage to the arteries and heart. Most people should eat less than 200 milligrams (mg) of cholesterol a day. Foods with cholesterol  Cholesterol is found in animal-based foods, such as meat, seafood, and dairy. Generally, low-fat dairy and lean meats have less cholesterol than full-fat dairy and fatty meats. The milligrams of cholesterol per serving (mg per serving) of common cholesterol-containing foods are listed below. Meat and other proteins  Egg -- one large whole egg has 186 mg.  Veal shank -- 4 oz has 141 mg.  Lean ground [place] (93% lean) -- 4 oz has 118 mg.  Fat-trimmed lamb loin -- 4 oz has 106 mg.  Lean ground beef (90% lean) -- 4 oz has 100 mg.  Lobster -- 3.5 oz has 90  mg.  Pork loin chops -- 4 oz has 86 mg.  Canned salmon -- 3.5 oz has 83 mg.  Fat-trimmed beef top loin -- 4 oz has 78 mg.  Frankfurter -- 1 frank (3.5 oz) has 77 mg.  Crab -- 3.5 oz has 71 mg.  Roasted chicken without skin, white meat -- 4 oz has 66 mg.  Light bologna -- 2 oz has 45 mg.  Deli-cut [place] -- 2 oz has 31 mg.  Canned tuna -- 3.5 oz has 31 mg.  Berniece Salines -- 1 oz has 29 mg.  Oysters and mussels (raw) -- 3.5 oz has 25 mg.  Mackerel -- 1 oz has 22 mg.  Trout -- 1 oz has 20 mg.  Pork sausage -- 1 link (1 oz) has 17 mg.  Salmon -- 1 oz has 16 mg.  Tilapia -- 1 oz has 14 mg. Dairy  Soft-serve ice cream --  cup (4 oz) has 103 mg.  Whole-milk yogurt -- 1 cup (8 oz) has 29 mg.  Cheddar cheese -- 1 oz has 28 mg.  American cheese -- 1 oz has 28 mg.  Whole milk -- 1 cup (8 oz) has 23 mg.  2% milk --  1 cup (8 oz) has 18 mg.  Cream cheese -- 1 tablespoon (Tbsp) has 15 mg.  Cottage cheese --  cup (4 oz) has 14 mg.  Low-fat (1%) milk -- 1 cup (8 oz) has 10 mg.  Sour cream -- 1 Tbsp has 8.5 mg.  Low-fat yogurt -- 1 cup (8 oz) has 8 mg.  Nonfat Greek yogurt -- 1 cup (8 oz) has 7 mg.  Half-and-half cream -- 1 Tbsp has 5 mg. Fats and oils  Cod liver oil -- 1 tablespoon (Tbsp) has 82 mg.  Butter -- 1 Tbsp has 15 mg.  Lard -- 1 Tbsp has 14 mg.  Bacon grease -- 1 Tbsp has 14 mg.  Mayonnaise -- 1 Tbsp has 5-10 mg.  Margarine -- 1 Tbsp has 3-10 mg. Exact amounts of cholesterol in these foods may vary depending on specific ingredients and brands. Foods without cholesterol Most plant-based foods do not have cholesterol unless you combine them with a food that has cholesterol. Foods without cholesterol include:  Grains and cereals.  Vegetables.  Fruits.  Vegetable oils, such as olive, canola, and sunflower oil.  Legumes, such as peas, beans, and lentils.  Nuts and seeds.  Egg whites. Summary  The body needs cholesterol in small amounts, but  too much cholesterol can cause damage to the arteries and heart.  Most people should eat less than 200 milligrams (mg) of cholesterol a day. This information is not intended to replace advice given to you by your health care provider. Make sure you discuss any questions you have with your health care provider. Document Revised: 10/13/2017 Document Reviewed: 06/27/2017 Elsevier Patient Education  [place].

## 2020-05-01 NOTE — Progress Notes (Signed)
Chief Complaint  Patient presents with  . Follow-up   F/u  1. HTN/CADHLD s/p [employer] pacemaker (Dr.Ganji follows) on eliquis 5 mg bid doing well, coreg 6.25 mg bid pravachol 20 mg qhs was on zetia but stopped due to cost $40 for 30 day supply agreeable to increase pravachol  2. MM in situ rec call appt Dr.  Raliegh Ip for f/u  3. Right hip bursitis doing better s/p steroid injection 04/15/20  Review of Systems  Constitutional: Negative for weight loss.  HENT: Negative for hearing loss.   Eyes: Negative for blurred vision.  Respiratory: Negative for shortness of breath.   Cardiovascular: Negative for chest pain.  Gastrointestinal: Negative for abdominal pain.  Genitourinary:       +overactive bladder declines urology for now   Musculoskeletal: Negative for falls and joint pain.  Skin: Negative for rash.  Psychiatric/Behavioral: Negative for depression.   Past Medical History:  Diagnosis Date  . Atrophic kidney    left   . Cataract    left eye mild   . Chicken pox   . COVID-19    2020/2021 ? month  . Degenerative disc disease, lumbar    L4/5 noted imaging  . Dyslipidemia   . Emphysema of lung ([place])    COPD/bronchitis noted on imaging former smoker ([id number])  . Encounter for care of pacemaker 05/05/2019  . Gallstones   . GERD (gastroesophageal reflux disease)   . Granuloma of liver   . Hiatal hernia    small   . History of esophageal stricture    05/08/09  . Kidney stones   . LVH (left ventricular hypertrophy)    mild on imaging   . Solitary kidney    left kidney is atrophic    Past Surgical History:  Procedure Laterality Date  . FOOT SURGERY     right mortons neuroma   . PACEMAKER PLACEMENT     2015 Dr. Caryl Comes for AV block  . PERMANENT PACEMAKER INSERTION N/A 02/05/2014   Procedure: PERMANENT PACEMAKER INSERTION;  Surgeon: Deboraha Sprang, MD;  Location: [facility] CATH LAB;  Service: Cardiovascular;  Laterality: N/A;   Family History  Problem Relation Age of Onset  . Diabetes  Mother        died of complications   . Cancer Father        bladder   . Stroke Father    Social History   Socioeconomic History  . Marital status: Married    Spouse name: Not on file  . Number of children: 2  . Years of education: Not on file  . Highest education level: Not on file  Occupational History  . Not on file  Tobacco Use  . Smoking status: Former Smoker    Packs/day: 1.00    Years: 20.00    Pack years: 20.00    Types: Cigarettes    Quit date: 10/21/1991    Years since quitting: 28.5  . Smokeless tobacco: Never Used  . Tobacco comment: smoked [id number] <1 ppd no FH lung cancer   Vaping Use  . Vaping Use: Never used  Substance and Sexual Activity  . Alcohol use: Yes    Comment: Drinks 2-3 ounces of liquor daily  . Drug use: No  . Sexual activity: Not on file  Other Topics Concern  . Not on file  Social History Narrative   Used to be in the [place]    2 kids (son and daughter)   Married    Born [place]  grew up in [place]    Works part time at [employer] smoker 1968 to 1998 < 1ppd no FH lung cancer    Drinks 2-3 small shots of liquor daily, no drugs, never chewed tobacco    Social Determinants of Radio broadcast assistant Strain:   . Difficulty of Paying Living Expenses:   Food Insecurity:   . Worried About Charity fundraiser in the Last Year:   . Arboriculturist in the Last Year:   Transportation Needs:   . Film/video editor (Medical):   Marland Kitchen Lack of Transportation (Non-Medical):   Physical Activity:   . Days of Exercise per Week:   . Minutes of Exercise per Session:   Stress:   . Feeling of Stress :   Social Connections:   . Frequency of Communication with Friends and Family:   . Frequency of Social Gatherings with Friends and Family:   . Attends Religious Services:   . Active Member of Clubs or Organizations:   . Attends Archivist Meetings:   Marland Kitchen Marital Status:   Intimate Partner Violence:   . Fear of Current or  Ex-Partner:   . Emotionally Abused:   Marland Kitchen Physically Abused:   . Sexually Abused:    Current Meds  Medication Sig  . apixaban (ELIQUIS) 5 MG TABS tablet TAKE 1 TABLET(5 MG) BY MOUTH TWICE DAILY  . Ascorbic Acid (VITAMIN C) 1000 MG tablet Take 1,000 mg by mouth daily.  . carvedilol (COREG) 6.25 MG tablet Take 1 tablet (6.25 mg total) by mouth 2 (two) times daily with a meal.  . Cholecalciferol (VITAMIN D3) 1.25 MG (50000 UT) TABS Take 1 tablet by mouth once a week.  Marland Kitchen omeprazole (PRILOSEC) 40 MG capsule Take 1 capsule (40 mg total) by mouth daily. 30 minutes before breakfast appt more refills  . oxybutynin (DITROPAN-XL) 5 MG 24 hr tablet Take 1 tablet (5 mg total) by mouth at bedtime. appt further refills  . pravastatin (PRAVACHOL) 40 MG tablet Take 1 tablet (40 mg total) by mouth daily. At night  . tamsulosin (FLOMAX) 0.4 MG CAPS capsule Take 2 capsules (0.8 mg total) by mouth daily after supper.  . Zinc 50 MG CAPS Take 1 capsule by mouth daily.  . [DISCONTINUED] carvedilol (COREG) 6.25 MG tablet Take 1 tablet (6.25 mg total) by mouth 2 (two) times daily with a meal.  . [DISCONTINUED] omeprazole (PRILOSEC) 40 MG capsule Take 1 capsule (40 mg total) by mouth daily. 30 minutes before breakfast appt more refills  . [DISCONTINUED] oxybutynin (DITROPAN-XL) 5 MG 24 hr tablet Take 1 tablet (5 mg total) by mouth at bedtime. appt further refills  . [DISCONTINUED] pravastatin (PRAVACHOL) 20 MG tablet Take 1 tablet (20 mg total) by mouth daily. At night  . [DISCONTINUED] tamsulosin (FLOMAX) 0.4 MG CAPS capsule Take 2 capsules (0.8 mg total) by mouth daily after supper.   Allergies  Allergen Reactions  . Lipitor [Atorvastatin]     myalgias  . Lisinopril     Cough   . Simvastatin     myalgias   No results found for this or any previous visit (from the past 2160 hour(s)). Objective  Body mass index is 27.34 kg/m. Wt Readings from Last 3 Encounters:  05/01/20 207 lb 3.2 oz (94 kg)  03/26/20 210  lb (95.3 kg)  02/24/20 210 lb 8 oz (95.5 kg)   Temp Readings from Last 3 Encounters:  05/01/20 (!) 97.3 F (36.3  C) (Temporal)  02/24/20 98 F (36.7 C)  08/28/19 (!) 97.1 F (36.2 C)   BP Readings from Last 3 Encounters:  05/01/20 130/84  02/24/20 137/85  08/28/19 127/89   Pulse Readings from Last 3 Encounters:  05/01/20 60  02/24/20 62  08/28/19 70    Physical Exam Vitals and nursing note reviewed.  Constitutional:      Appearance: Normal appearance. He is well-developed and well-groomed.  HENT:     Head: Normocephalic and atraumatic.  Eyes:     Conjunctiva/sclera: Conjunctivae normal.     Pupils: Pupils are equal, round, and reactive to light.  Cardiovascular:     Rate and Rhythm: Normal rate and regular rhythm.     Heart sounds: Normal heart sounds.  Skin:    General: Skin is warm and moist.       Neurological:     Mental Status: He is alert and oriented to person, place, and time. Mental status is at baseline.     Gait: Gait normal.  Psychiatric:        Attention and Perception: Attention and perception normal.        Mood and Affect: Mood and affect normal.        Speech: Speech normal.        Behavior: Behavior normal. Behavior is cooperative.        Thought Content: Thought content normal.        Cognition and Memory: Cognition and memory normal.        Judgment: Judgment normal.     Assessment  Plan  Essential hypertension overall controlled - Plan: Comprehensive metabolic panel, Lipid panel, CBC with Differential/Platelet, carvedilol (COREG) 6.25 MG tablet bid   Hyperlipidemia, unspecified hyperlipidemia type - Plan: pravastatin (PRAVACHOL) 40 MG tablet Wants to stop zetia due to cost   Coronary artery disease involving native coronary artery of native heart without angina pectoris - Plan: pravastatin (PRAVACHOL) 40 MG tablet, carvedilol (COREG) 6.25 MG tablet See above  F/u cards Dr. Einar Gip  Bursitis of right hip, unspecified bursa F/u Dr.  Gerald Stabs blackman  Benign prostatic hyperplasia with weak urinary stream - Plan: tamsulosin (FLOMAX) 0.4 MG CAPS capsule Overactive bladder - Plan: oxybutynin (DITROPAN-XL) 5 MG 24 hr tablet Declines urology for now   Gastroesophageal reflux disease without esophagitis - Plan: omeprazole (PRILOSEC) 40 MG capsule    HM Flu shot and prevnar utd pna 23 utd rec pt had Tdap(pt declines today)and shingrixgivenRxprev but disc consider get [facility] or pharmacy.  Declines MMR testing   Consider hep C testing in future will disc with pt   PSA nl declines DRErechecked 11/2018   Dr. Watt Climes screening colonoscopy [facility] -diverticulosis repeat in 10 years 01/22/18 Pt declines further   -pt saw Dr. Nehemiah Massed 05/01/18 MM in situ left medial chest infraclavicular no lymphadenopathy rec f/u q3 months for 1 year then q4 months for a year and q6 months x 2 years -derm f/u upcoming 05/2019 -as of 05/01/20 rec pt call above for appt  Considered CT chest repeat h/o smoking and CT chest in the past mild changes COPD and chronic bronchitis (of note calculated risk and low risk) also consider repeat CT ab/pelvis with and w/o contrast or MRI (reasons CT 2015 c/w calcified granuloma w/in liver and nonspecific foci w/in liver 7-10 mm and rec f/u also noted gallstones, kidney stones, left atrophic kidney) -pt declined both of these on 03/23/18    Reviewed cardiology notes 04/02/18 Dr. Einar Gip f/u heart block  dual chamber pacemaker 02/05/14 rec add zetia f/u in 1 year  Provider: Dr. Olivia Mackie McLean-Scocuzza-Internal Medicine

## 2020-05-04 ENCOUNTER — Telehealth: Payer: Self-pay | Admitting: Internal Medicine

## 2020-05-04 ENCOUNTER — Ambulatory Visit: Payer: Medicare Other | Admitting: Orthopaedic Surgery

## 2020-05-04 NOTE — Telephone Encounter (Signed)
[name] can you call patient mention yes is correct not discussed at appt but I am recommending this now based on low platelets to work this up and after review of his chart CT scan 2015 with liver lesions (before he started seeing me) and to follow up on this   If he wants to discuss with me I will not be back until 05/11/20   Otherwise if explanation above is sufficient which will be the same explanation I give him 05/11/20 then can proceed with scheduling [place]   Thanks [place]

## 2020-05-04 NOTE — Telephone Encounter (Signed)
Spoke with the patient and informed him of the below. He was concerned with cost and medical necessity. Informed the patient that with his recent low platelet count and history of liver lesions, this imaging is considered medically necessary and should be covered by insurance. Informed the Patient that from a medical coding stand point this should be covered, but he should contact his insurance to determine his benefits and what copay there may be.   Patient verbalized understanding and will await a call to be scheduled for the [place]. He is agreeable to this.

## 2020-05-04 NOTE — Telephone Encounter (Signed)
I called pt to schedule [place] ab. Pt stated the [place] was not discussed with him and he wants to speak with you first. Please advise and Thank you!  Call pt @ [phone]

## 2020-05-15 ENCOUNTER — Telehealth: Payer: Self-pay | Admitting: *Deleted

## 2020-05-15 ENCOUNTER — Encounter: Payer: Self-pay | Admitting: Internal Medicine

## 2020-05-15 ENCOUNTER — Ambulatory Visit
Admission: RE | Admit: 2020-05-15 | Discharge: 2020-05-15 | Disposition: A | Discharge status: Home / Self Care | Payer: Medicare Other | Source: Ambulatory Visit | Attending: Internal Medicine | Admitting: Internal Medicine

## 2020-05-15 ENCOUNTER — Other Ambulatory Visit: Payer: Self-pay

## 2020-05-15 DIAGNOSIS — R160 Hepatomegaly, not elsewhere classified: Secondary | ICD-10-CM | POA: Insufficient documentation

## 2020-05-15 DIAGNOSIS — K769 Liver disease, unspecified: Secondary | ICD-10-CM

## 2020-05-15 DIAGNOSIS — D696 Thrombocytopenia, unspecified: Secondary | ICD-10-CM | POA: Diagnosis present

## 2020-05-15 DIAGNOSIS — K8020 Calculus of gallbladder without cholecystitis without obstruction: Secondary | ICD-10-CM | POA: Insufficient documentation

## 2020-05-15 DIAGNOSIS — K760 Fatty (change of) liver, not elsewhere classified: Secondary | ICD-10-CM | POA: Insufficient documentation

## 2020-05-15 DIAGNOSIS — D71 Functional disorders of polymorphonuclear neutrophils: Secondary | ICD-10-CM | POA: Insufficient documentation

## 2020-05-15 NOTE — Telephone Encounter (Signed)
Please place future orders for lab appt.   Only Hep C order found

## 2020-05-18 ENCOUNTER — Other Ambulatory Visit: Payer: Self-pay | Admitting: Internal Medicine

## 2020-05-18 DIAGNOSIS — N3000 Acute cystitis without hematuria: Secondary | ICD-10-CM

## 2020-05-18 DIAGNOSIS — N1330 Unspecified hydronephrosis: Secondary | ICD-10-CM | POA: Insufficient documentation

## 2020-05-18 NOTE — Addendum Note (Signed)
Addended by: Orland Mustard on: 05/18/2020 09:13 PM   Modules accepted: Orders

## 2020-05-18 NOTE — Telephone Encounter (Signed)
Hep C if pt agreeable hep C screening Denisa ordered this he previously refused with me   Reason for lab visit is urine and culture   Thanks [place]

## 2020-05-19 ENCOUNTER — Other Ambulatory Visit (INDEPENDENT_AMBULATORY_CARE_PROVIDER_SITE_OTHER): Payer: Medicare Other

## 2020-05-19 ENCOUNTER — Other Ambulatory Visit: Payer: Self-pay

## 2020-05-19 DIAGNOSIS — Z1159 Encounter for screening for other viral diseases: Secondary | ICD-10-CM | POA: Diagnosis not present

## 2020-05-19 DIAGNOSIS — N3000 Acute cystitis without hematuria: Secondary | ICD-10-CM | POA: Diagnosis not present

## 2020-05-19 NOTE — Telephone Encounter (Signed)
Pt wasn't aware of the lab part of his visit today. I told him if was for a Hep C but only if he agreed. Pt stated "that's fine" so I proceeded with lab collection.

## 2020-05-20 LAB — URINALYSIS, ROUTINE W REFLEX MICROSCOPIC
Bacteria, UA: NONE SEEN /HPF
Bilirubin Urine: NEGATIVE
Glucose, UA: NEGATIVE
Hgb urine dipstick: NEGATIVE
Hyaline Cast: NONE SEEN /LPF
Ketones, ur: NEGATIVE
Nitrite: NEGATIVE
Protein, ur: NEGATIVE
RBC / HPF: NONE SEEN /HPF (ref 0–2)
Specific Gravity, Urine: 1.01 (ref 1.001–1.03)
Squamous Epithelial / HPF: NONE SEEN /HPF (ref <=5)
WBC, UA: NONE SEEN /HPF (ref 0–5)
pH: 6 (ref 5.0–8.0)

## 2020-05-20 LAB — HEPATITIS C ANTIBODY
Hepatitis C Ab: NONREACTIVE
SIGNAL TO CUT-OFF: 0.01 (ref <1.00)

## 2020-05-20 LAB — URINE CULTURE
MICRO NUMBER:: 10670330
SPECIMEN QUALITY:: ADEQUATE

## 2020-06-11 ENCOUNTER — Ambulatory Visit (INDEPENDENT_AMBULATORY_CARE_PROVIDER_SITE_OTHER): Payer: Medicare Other | Admitting: Urology

## 2020-06-11 ENCOUNTER — Other Ambulatory Visit: Payer: Self-pay

## 2020-06-11 ENCOUNTER — Encounter: Payer: Self-pay | Admitting: Urology

## 2020-06-11 VITALS — BP 139/93 | HR 80 | Ht 73.0 in | Wt 205.0 lb

## 2020-06-11 DIAGNOSIS — R339 Retention of urine, unspecified: Secondary | ICD-10-CM

## 2020-06-11 DIAGNOSIS — R3912 Poor urinary stream: Secondary | ICD-10-CM

## 2020-06-11 DIAGNOSIS — N401 Enlarged prostate with lower urinary tract symptoms: Secondary | ICD-10-CM

## 2020-06-11 LAB — BLADDER SCAN AMB NON-IMAGING

## 2020-06-11 NOTE — Progress Notes (Signed)
06/11/20 2:39 PM   Izak Bary Leriche Mar 18, 1946 [id number]  CC: Urinary symptoms, left hydronephrosis  HPI: I saw Mr. Espin in urology clinic today for the above issues.  He is a 74 year old male with a history of atrial fibrillation on anticoagulation with Eliquis who is otherwise healthy and reports a multiyear history of worsening urinary symptoms.  His primary urinary complaints are feeling of incomplete emptying, urgency, and urge incontinence, as well as weak stream.  He has been trialed on Flomax and oxybutynin by his PCP with only minimal improvement.  Imaging is notable for recent abdominal ultrasound with severe left-sided hydronephrosis with cortical thinning and atrophic left kidney which has been chronic since at least 2008, and appears congenital.  There was no right-sided hydronephrosis.  He denies any history of UTIs or urinary retention.  He denies any gross hematuria.  Most recent PSA was normal at 0.18.  Renal function is normal with creatinine 0.95, EGFR greater than 60.   Urinalysis and urine culture were negative on 05/19/2020.  IPSS score today is 23, with quality of life mostly dissatisfied.  PVR is >915m despite just voiding.  PMH: Past Medical History:  Diagnosis Date  . Atrophic kidney    left   . Cataract    left eye mild   . Chicken pox   . COVID-19    2020/2021 ? month  . Degenerative disc disease, lumbar    L4/5 noted imaging  . Dyslipidemia   . [place] lung [place] [place]    COPD/bronchitis noted on imaging former smoker (([id number]  . Encounter for care of pacemaker 05/05/2019  . Gallstones   . GERD (gastroesophageal reflux disease)   . Granuloma of liver   . Hiatal hernia    small   . History of esophageal stricture    05/08/09  . Kidney stones   . LVH (left ventricular hypertrophy)    mild on imaging   . Solitary kidney    left kidney is atrophic     Surgical History: Past Surgical History:  Procedure Laterality Date  . FOOT SURGERY     right  mortons neuroma   . PACEMAKER PLACEMENT     2015 Dr. KCaryl Comesfor AV block  . PERMANENT PACEMAKER INSERTION N/A 02/05/2014   Procedure: PERMANENT PACEMAKER INSERTION;  Surgeon: SDeboraha Sprang MD;  [facility];  Service: Cardiovascular;  Laterality: N/A;    Family History: Family History  Problem Relation Age of Onset  . Diabetes Mother        died of complications   . Cancer Father        bladder   . Stroke Father     Social History:  reports that he quit smoking about 28 years ago. His smoking use included cigarettes. He has a 20.00 pack-year smoking history. He has never used smokeless tobacco. He reports current alcohol use. He reports that he does not use drugs.  Physical Exam: BP (!) 139/93 (BP Location: Left Arm, Patient Position: Sitting, Cuff Size: Large)   Pulse 80   Ht '6\' 1"'  (1.854 m)   Wt (!) 205 lb (93 kg)   BMI 27.05 kg/m    Constitutional:  Alert and oriented, No acute distress. Cardiovascular: No clubbing, cyanosis, or edema. Respiratory: Normal respiratory effort, no increased work of breathing. GI: Abdomen is soft, nontender, nondistended, no abdominal masses GU: phallus without lesions, widely patent meatus DRE: 50 g prostate, smooth, no nodules or masses  Laboratory Data: Reviewed, see  HPI  Pertinent Imaging: I have personally reviewed the abdominal ultrasound, see HPI  Assessment & Plan:   He is a 74 year old male with atrial fibrillation on Eliquis who presents with a few years of worsening urinary symptoms primarily urgency, frequency, and urge incontinence despite maximal medical therapy with oxybutynin and Flomax.  His bladder scan is notable for greater than a liter today.  We discussed different etiologies of incomplete bladder emptying including atonic bladder versus bladder outlet obstruction from either BPH, urethral stricture disease, or malignancy.  We also discussed long-term repercussions of unmanaged incomplete bladder emptying  including UTIs, urinary retention, and upstream hydronephrosis and his essentially solitary right kidney resulting in renal impairment/failure.  I recommended cystoscopy and transrectal ultrasound for further evaluation, and consideration of an outlet procedure, versus urodynamics to evaluate bladder functionality or intermittent catheterization.  Follow-up next week for cystoscopy and transrectal ultrasound Likely pursue outlet procedure in setting of incomplete bladder emptying and severe urinary symptoms We will discuss pros and cons of urodynamics at next visit pending cystoscopy findings   Nickolas Madrid, MD 06/11/2020  [facility] [place] [address] [place], [place] [address] [phone]

## 2020-06-11 NOTE — H&P (View-Only) (Signed)
06/11/20 2:39 PM   Jameison Bary Leriche [birth year] [id number]  CC: Urinary symptoms, left hydronephrosis  HPI: I saw Mr. Tober in urology clinic today for the above issues.  He is a 74 year old male with a history of atrial fibrillation on anticoagulation with Eliquis who is otherwise healthy and reports a multiyear history of worsening urinary symptoms.  His primary urinary complaints are feeling of incomplete emptying, urgency, and urge incontinence, as well as weak stream.  He has been trialed on Flomax and oxybutynin by his PCP with only minimal improvement.  Imaging is notable for recent abdominal ultrasound with severe left-sided hydronephrosis with cortical thinning and atrophic left kidney which has been chronic since at least 2008, and appears congenital.  There was no right-sided hydronephrosis.  He denies any history of UTIs or urinary retention.  He denies any gross hematuria.  Most recent PSA was normal at 0.18.  Renal function is normal with creatinine 0.95, EGFR greater than 60.   Urinalysis and urine culture were negative on 05/19/2020.  IPSS score today is 23, with quality of life mostly dissatisfied.  PVR is >918m despite just voiding.  PMH: Past Medical History:  Diagnosis Date  . Atrophic kidney    left   . Cataract    left eye mild   . Chicken pox   . COVID-19    2020/2021 ? month  . Degenerative disc disease, lumbar    L4/5 noted imaging  . Dyslipidemia   . Emphysema of lung ([facility]    COPD/bronchitis noted on imaging former smoker (([id number]  . Encounter for care of pacemaker 05/05/2019  . Gallstones   . GERD (gastroesophageal reflux disease)   . Granuloma of liver   . Hiatal hernia    small   . History of esophageal stricture    05/08/09  . Kidney stones   . LVH (left ventricular hypertrophy)    mild on imaging   . Solitary kidney    left kidney is atrophic     Surgical History: Past Surgical History:  Procedure Laterality Date  . FOOT SURGERY     right  mortons neuroma   . PACEMAKER PLACEMENT     2015 Dr. KCaryl Comesfor AV block  . PERMANENT PACEMAKER INSERTION N/A 02/05/2014   Procedure: PERMANENT PACEMAKER INSERTION;  Surgeon: SDeboraha Sprang MD;  [facility];  Service: Cardiovascular;  Laterality: N/A;    Family History: Family History  Problem Relation Age of Onset  . Diabetes Mother        died of complications   . Cancer Father        bladder   . Stroke Father     Social History:  reports that he quit smoking about 28 years ago. His smoking use included cigarettes. He has a 20.00 pack-year smoking history. He has never used smokeless tobacco. He reports current alcohol use. He reports that he does not use drugs.  Physical Exam: BP (!) 139/93 (BP Location: Left Arm, Patient Position: Sitting, Cuff Size: Large)   Pulse 80   Ht '6\' 1"'  (1.854 m)   Wt (!) 205 lb (93 kg)   BMI 27.05 kg/m    Constitutional:  Alert and oriented, No acute distress. Cardiovascular: No clubbing, cyanosis, or edema. Respiratory: Normal respiratory effort, no increased work of breathing. GI: Abdomen is soft, nontender, nondistended, no abdominal masses GU: phallus without lesions, widely patent meatus DRE: 50 g prostate, smooth, no nodules or masses  Laboratory Data: Reviewed, see  HPI  Pertinent Imaging: I have personally reviewed the abdominal ultrasound, see HPI  Assessment & Plan:   He is a 74 year old male with atrial fibrillation on Eliquis who presents with a few years of worsening urinary symptoms primarily urgency, frequency, and urge incontinence despite maximal medical therapy with oxybutynin and Flomax.  His bladder scan is notable for greater than a liter today.  We discussed different etiologies of incomplete bladder emptying including atonic bladder versus bladder outlet obstruction from either BPH, urethral stricture disease, or malignancy.  We also discussed long-term repercussions of unmanaged incomplete bladder emptying  including UTIs, urinary retention, and upstream hydronephrosis and his essentially solitary right kidney resulting in renal impairment/failure.  I recommended cystoscopy and transrectal ultrasound for further evaluation, and consideration of an outlet procedure, versus urodynamics to evaluate bladder functionality or intermittent catheterization.  Follow-up next week for cystoscopy and transrectal ultrasound Likely pursue outlet procedure in setting of incomplete bladder emptying and severe urinary symptoms We will discuss pros and cons of urodynamics at next visit pending cystoscopy findings   Nickolas Madrid, MD 06/11/2020  [facility] [address], [place],  [address] [phone]

## 2020-06-11 NOTE — Patient Instructions (Signed)
Benign Prostatic Hyperplasia  Benign prostatic hyperplasia (BPH) is an enlarged prostate gland that is caused by the normal aging process and not by cancer. The prostate is a walnut-sized gland that is involved in the production of semen. It is located in front of the rectum and below the bladder. The bladder stores urine and the urethra is the tube that carries the urine out of the body. The prostate may get bigger as a man gets older. An enlarged prostate can press on the urethra. This can make it harder to pass urine. The build-up of urine in the bladder can cause infection. Back pressure and infection may progress to bladder damage and kidney (renal) failure. What are the causes? This condition is part of a normal aging process. However, not all men develop problems from this condition. If the prostate enlarges away from the urethra, urine flow will not be blocked. If it enlarges toward the urethra and compresses it, there will be problems passing urine. What increases the risk? This condition is more likely to develop in men over the age of 50 years. What are the signs or symptoms? Symptoms of this condition include:  Getting up often during the night to urinate.  Needing to urinate frequently during the day.  Difficulty starting urine flow.  Decrease in size and strength of your urine stream.  Leaking (dribbling) after urinating.  Inability to pass urine. This needs immediate treatment.  Inability to completely empty your bladder.  Pain when you pass urine. This is more common if there is also an infection.  Urinary tract infection (UTI). How is this diagnosed? This condition is diagnosed based on your medical history, a physical exam, and your symptoms. Tests will also be done, such as:  A post-void bladder scan. This measures any amount of urine that may remain in your bladder after you finish urinating.  A digital rectal exam. In a rectal exam, your health care provider  checks your prostate by putting a lubricated, gloved finger into your rectum to feel the back of your prostate gland. This exam detects the size of your gland and any abnormal lumps or growths.  An exam of your urine (urinalysis).  A prostate specific antigen (PSA) screening. This is a blood test used to screen for prostate cancer.  An ultrasound. This test uses sound waves to electronically produce a picture of your prostate gland. Your health care provider may refer you to a specialist in kidney and prostate diseases (urologist). How is this treated? Once symptoms begin, your health care provider will monitor your condition (active surveillance or watchful waiting). Treatment for this condition will depend on the severity of your condition. Treatment may include:  Observation and yearly exams. This may be the only treatment needed if your condition and symptoms are mild.  Medicines to relieve your symptoms, including: ? Medicines to shrink the prostate. ? Medicines to relax the muscle of the prostate.  Surgery in severe cases. Surgery may include: ? Prostatectomy. In this procedure, the prostate tissue is removed completely through an open incision or with a laparoscope or robotics. ? Transurethral resection of the prostate (TURP). In this procedure, a tool is inserted through the opening at the tip of the penis (urethra). It is used to cut away tissue of the inner core of the prostate. The pieces are removed through the same opening of the penis. This removes the blockage. ? Transurethral incision (TUIP). In this procedure, small cuts are made in the prostate. This lessens   the prostate's pressure on the urethra. ? Transurethral microwave thermotherapy (TUMT). This procedure uses microwaves to create heat. The heat destroys and removes a small amount of prostate tissue. ? Transurethral needle ablation (TUNA). This procedure uses radio frequencies to destroy and remove a small amount of  prostate tissue. ? Interstitial laser coagulation ([place]). This procedure uses a laser to destroy and remove a small amount of prostate tissue. ? Transurethral electrovaporization (TUVP). This procedure uses electrodes to destroy and remove a small amount of prostate tissue. ? Prostatic urethral lift. This procedure inserts an implant to push the lobes of the prostate away from the urethra. Follow these instructions at home:  Take over-the-counter and prescription medicines only as told by your health care provider.  Monitor your symptoms for any changes. Contact your health care provider with any changes.  Avoid drinking large amounts of liquid before going to bed or out in public.  Avoid or reduce how much caffeine or alcohol you drink.  Give yourself time when you urinate.  Keep all follow-up visits as told by your health care provider. This is important. Contact a health care provider if:  You have unexplained back pain.  Your symptoms do not get better with treatment.  You develop side effects from the medicine you are taking.  Your urine becomes very dark or has a bad smell.  Your lower abdomen becomes distended and you have trouble passing your urine. Get help right away if:  You have a fever or chills.  You suddenly cannot urinate.  You feel lightheaded, or very dizzy, or you faint.  There are large amounts of blood or clots in the urine.  Your urinary problems become hard to manage.  You develop moderate to severe low back or flank pain. The flank is the side of your body between the ribs and the hip. These symptoms may represent a serious problem that is an emergency. Do not wait to see if the symptoms will go away. Get medical help right away. Call your local emergency services (911 in the [place] Do not drive yourself to the hospital. Summary  Benign prostatic hyperplasia (BPH) is an enlarged prostate that is caused by the normal aging process and not by  cancer.  An enlarged prostate can press on the urethra. This can make it hard to pass urine.  This condition is part of a normal aging process and is more likely to develop in men over the age of 71 years.  Get help right away if you suddenly cannot urinate. This information is not intended to replace advice given to you by your health care provider. Make sure you discuss any questions you have with your health care provider. Document Revised: 09/25/2018 Document Reviewed: 12/05/2016 Elsevier Patient Education  2020 [employer].   Cystoscopy Cystoscopy is a procedure that is used to help diagnose and sometimes treat conditions that affect the lower urinary tract. The lower urinary tract includes the bladder and the urethra. The urethra is the tube that drains urine from the bladder. Cystoscopy is done using a thin, tube-shaped instrument with a light and camera at the end (cystoscope). The cystoscope may be hard or flexible, depending on the goal of the procedure. The cystoscope is inserted through the urethra, into the bladder. Cystoscopy may be recommended if you have:  Urinary tract infections that keep coming back.  Blood in the urine (hematuria).  An inability to control when you urinate (urinary incontinence) or an overactive bladder.  Unusual cells  found in a urine sample.  A blockage in the urethra, such as a urinary stone.  Painful urination.  An abnormality in the bladder found during an intravenous pyelogram (IVP) or CT scan. Cystoscopy may also be done to remove a sample of tissue to be examined under a microscope (biopsy). Tell a health care provider about:  Any allergies you have.  All medicines you are taking, including vitamins, herbs, eye drops, creams, and over-the-counter medicines.  Any problems you or family members have had with anesthetic medicines.  Any blood disorders you have.  Any surgeries you have had.  Any medical conditions you  have.  Whether you are pregnant or may be pregnant. What are the risks? Generally, this is a safe procedure. However, problems may occur, including:  Infection.  Bleeding.  Allergic reactions to medicines.  Damage to other structures or organs. What happens before the procedure?  Ask your health care provider about: ? Changing or stopping your regular medicines. This is especially important if you are taking diabetes medicines or blood thinners. ? Taking medicines such as aspirin and ibuprofen. These medicines can thin your blood. Do not take these medicines unless your health care provider tells you to take them. ? Taking over-the-counter medicines, vitamins, herbs, and supplements.  Follow instructions from your health care provider about eating or drinking restrictions.  Ask your health care provider what steps will be taken to help prevent infection. These may include: ? Washing skin with a germ-killing soap. ? Taking antibiotic medicine.  You may have an exam or testing, such as: ? X-rays of the bladder, urethra, or kidneys. ? Urine tests to check for signs of infection.  Plan to have someone take you home from the hospital or clinic. What happens during the procedure?   You will be given one or more of the following: ? A medicine to help you relax (sedative). ? A medicine to numb the area (local anesthetic).  The area around the opening of your urethra will be cleaned.  The cystoscope will be passed through your urethra into your bladder.  Germ-free (sterile) fluid will flow through the cystoscope to fill your bladder. The fluid will stretch your bladder so that your health care provider can clearly examine your bladder walls.  Your doctor will look at the urethra and bladder. Your doctor may take a biopsy or remove stones.  The cystoscope will be removed, and your bladder will be emptied. The procedure may vary among health care providers and hospitals. What can  I expect after the procedure? After the procedure, it is common to have:  Some soreness or pain in your abdomen and urethra.  Urinary symptoms. These include: ? Mild pain or burning when you urinate. Pain should stop within a few minutes after you urinate. This may last for up to 1 week. ? A small amount of blood in your urine for several days. ? Feeling like you need to urinate but producing only a small amount of urine. Follow these instructions at home: Medicines  Take over-the-counter and prescription medicines only as told by your health care provider.  If you were prescribed an antibiotic medicine, take it as told by your health care provider. Do not stop taking the antibiotic even if you start to feel better. General instructions  Return to your normal activities as told by your health care provider. Ask your health care provider what activities are safe for you.  Do not drive for 24 hours if you were  given a sedative during your procedure.  Watch for any blood in your urine. If the amount of blood in your urine increases, call your health care provider.  Follow instructions from your health care provider about eating or drinking restrictions.  If a tissue sample was removed for testing (biopsy) during your procedure, it is up to you to get your test results. Ask your health care provider, or the department that is doing the test, when your results will be ready.  Drink enough fluid to keep your urine pale yellow.  Keep all follow-up visits as told by your health care provider. This is important. Contact a health care provider if you:  Have pain that gets worse or does not get better with medicine, especially pain when you urinate.  Have trouble urinating.  Have more blood in your urine. Get help right away if you:  Have blood clots in your urine.  Have abdominal pain.  Have a fever or chills.  Are unable to urinate. Summary  Cystoscopy is a procedure that is  used to help diagnose and sometimes treat conditions that affect the lower urinary tract.  Cystoscopy is done using a thin, tube-shaped instrument with a light and camera at the end.  After the procedure, it is common to have some soreness or pain in your abdomen and urethra.  Watch for any blood in your urine. If the amount of blood in your urine increases, call your health care provider.  If you were prescribed an antibiotic medicine, take it as told by your health care provider. Do not stop taking the antibiotic even if you start to feel better. This information is not intended to replace advice given to you by your health care provider. Make sure you discuss any questions you have with your health care provider. Document Revised: 10/23/2018 Document Reviewed: 10/23/2018 Elsevier Patient Education  [employer].

## 2020-06-17 ENCOUNTER — Other Ambulatory Visit: Payer: Self-pay | Admitting: Urology

## 2020-06-18 ENCOUNTER — Other Ambulatory Visit: Payer: Self-pay | Admitting: Urology

## 2020-06-24 ENCOUNTER — Other Ambulatory Visit: Payer: Self-pay

## 2020-06-24 ENCOUNTER — Encounter: Payer: Self-pay | Admitting: Urology

## 2020-06-24 ENCOUNTER — Ambulatory Visit (INDEPENDENT_AMBULATORY_CARE_PROVIDER_SITE_OTHER): Payer: Medicare Other | Admitting: Urology

## 2020-06-24 VITALS — BP 127/81 | HR 84 | Ht 73.0 in | Wt 209.8 lb

## 2020-06-24 DIAGNOSIS — I2510 Atherosclerotic heart disease of native coronary artery without angina pectoris: Secondary | ICD-10-CM

## 2020-06-24 DIAGNOSIS — N401 Enlarged prostate with lower urinary tract symptoms: Secondary | ICD-10-CM | POA: Diagnosis not present

## 2020-06-24 DIAGNOSIS — R339 Retention of urine, unspecified: Secondary | ICD-10-CM | POA: Diagnosis not present

## 2020-06-24 DIAGNOSIS — R3912 Poor urinary stream: Secondary | ICD-10-CM

## 2020-06-24 DIAGNOSIS — I10 Essential (primary) hypertension: Secondary | ICD-10-CM

## 2020-06-24 MED ORDER — LIDOCAINE HCL URETHRAL/MUCOSAL 2 % EX GEL
1.0000 "application " | Freq: Once | CUTANEOUS | Status: AC
  Administered 2020-06-24: 1 via URETHRAL

## 2020-06-24 MED ORDER — CARVEDILOL 6.25 MG PO TABS: 6.2500 mg | ORAL_TABLET | Freq: Two times a day (BID) | ORAL | 3 refills | Status: DC

## 2020-06-24 NOTE — Patient Instructions (Signed)

## 2020-06-24 NOTE — Progress Notes (Signed)
Cystoscopy/TRUS Procedure Note:  Indication: BPH and urinary symptoms, incomplete bladder emptying with PVR greater than 1 L  After informed consent and discussion of the procedure and its risks, [name] was positioned and prepped in the standard fashion. Cystoscopy was performed with a flexible cystoscope. The urethra, bladder neck and entire bladder was visualized in a standard fashion. The prostate was short with a high bladder neck.  Moderate to severe bladder trabeculations, no suspicious bladder lesions.  No abnormalities on retroflexion.  Transrectal ultrasound Imaging: The transrectal ultrasound was inserted into the rectum, and the prostate was measured calculating a volume of 17 mL.  Findings: Small tight prostate with high bladder neck, severe bladder trabeculations with no bladder lesions, no urethral strictures  ----------------------------------------------------------  Assessment and Plan: He is a healthy 74 year old male with 2 to 3-year history of worsening urinary symptoms including weak urinary stream, urgency, and urge incontinence, and bladder scan greater than 1 L with incomplete bladder emptying.    We discussed possible etiologies including bladder outlet obstruction from BPH versus atonic bladder.  We discussed the role of urodynamics in differentiating between these 2 pathologies.  We discussed that an atonic bladder is typically managed with intermittent catheterization, whereas outlet obstruction response to an outlet procedure like TURP or HoLEP.  We discussed the risks and benefits of urodynamics at length.  We also reviewed intermittent catheterization as a bladder management strategy, but he would like to pursue any surgical options to void spontaneously and improve his urinary symptoms before resigning himself to CIC.  We discussed the risks and benefits of HoLEP at length.  The procedure requires general anesthesia and takes 2 to 3 hours, and a holmium laser  is used to enucleate the prostate and push this tissue into the bladder.  A morcellator is then used to remove this tissue, which is sent for pathology.  The vast majority of patients are able to discharge the same day with a catheter in place for 2 to 3 days, and will follow-up in clinic for a voiding trial.  Approximately 5% of patients will be admitted overnight to monitor the urine, or if they have multiple co-morbidities.  We specifically discussed the risks of bleeding, infection, retrograde ejaculation, temporary urgency and urge incontinence, very low risk of long-term incontinence, pathologic evaluation of prostate tissue and possible detection of prostate cancer or other malignancy, and possible need for additional procedures.  Schedule HOLEP Will need to come off Eliquis for surgery  Jose Madrid, MD 06/24/2020

## 2020-06-26 ENCOUNTER — Other Ambulatory Visit: Payer: Self-pay | Admitting: Urology

## 2020-06-26 DIAGNOSIS — N401 Enlarged prostate with lower urinary tract symptoms: Secondary | ICD-10-CM

## 2020-06-26 LAB — URINALYSIS, COMPLETE
Bilirubin, UA: NEGATIVE
Glucose, UA: NEGATIVE
Ketones, UA: NEGATIVE
Leukocytes,UA: NEGATIVE
Nitrite, UA: NEGATIVE
Protein,UA: NEGATIVE
RBC, UA: NEGATIVE
Specific Gravity, UA: 1.025 (ref 1.005–1.030)
Urobilinogen, Ur: 0.2 mg/dL (ref 0.2–1.0)
pH, UA: 6 (ref 5.0–7.5)

## 2020-06-26 LAB — MICROSCOPIC EXAMINATION: Bacteria, UA: NONE SEEN

## 2020-06-30 LAB — CULTURE, URINE COMPREHENSIVE

## 2020-07-03 ENCOUNTER — Encounter: Payer: Self-pay | Admitting: Cardiology

## 2020-07-08 ENCOUNTER — Encounter
Admission: RE | Admit: 2020-07-08 | Discharge: 2020-07-08 | Disposition: A | Discharge status: Home / Self Care | Payer: Medicare Other | Source: Ambulatory Visit | Attending: Urology | Admitting: Urology

## 2020-07-08 ENCOUNTER — Other Ambulatory Visit: Payer: Self-pay

## 2020-07-08 ENCOUNTER — Other Ambulatory Visit
Admission: RE | Admit: 2020-07-08 | Discharge: 2020-07-08 | Disposition: A | Discharge status: Home / Self Care | Payer: Medicare Other | Source: Ambulatory Visit | Attending: Urology | Admitting: Urology

## 2020-07-08 ENCOUNTER — Encounter: Payer: Self-pay | Admitting: Cardiology

## 2020-07-08 DIAGNOSIS — Z20822 Contact with and (suspected) exposure to covid-19: Secondary | ICD-10-CM | POA: Diagnosis not present

## 2020-07-08 DIAGNOSIS — Z01812 Encounter for preprocedural laboratory examination: Secondary | ICD-10-CM | POA: Diagnosis not present

## 2020-07-08 HISTORY — DX: Presence of cardiac pacemaker: Z95.0

## 2020-07-08 HISTORY — DX: Presence of automatic (implantable) cardiac defibrillator: Z95.810

## 2020-07-08 HISTORY — DX: Personal history of urinary calculi: Z87.442

## 2020-07-08 NOTE — Patient Instructions (Signed)
Your procedure is scheduled on: Friday 07/10/20.  Report to DAY SURGERY DEPARTMENT LOCATED ON 2ND FLOOR MEDICAL MALL ENTRANCE. To find out your arrival time please call [phone] between 1PM - 3PM on Thursday 07/09/20.   Remember: Instructions that are not followed completely may result in serious medical risk, up to and including death, or upon the discretion of your surgeon and anesthesiologist your surgery may need to be rescheduled.     __X__ 1. Do not eat food after midnight the night before your procedure.                 No gum chewing or hard candies. You may drink clear liquids up to 2 hours                 before you are scheduled to arrive for your surgery- DO NOT drink clear                 liquids within 2 hours of the start of your surgery.                 Clear Liquids include:  water, apple juice without pulp, clear carbohydrate                 drink such as Clearfast or Gatorade, Black Coffee or Tea (Do not add                 milk or creamer to coffee or tea).  __X__2.  On the morning of surgery brush your teeth with toothpaste and water, you may rinse your mouth with mouthwash if you wish.  Do not swallow any toothpaste or mouthwash.    __X__ 3.  No Alcohol for 24 hours before or after surgery.  __X__ 4.  Do Not Smoke or use e-cigarettes For 24 Hours Prior to Your Surgery.                 Do not use any chewable tobacco products for at least 6 hours prior to                 surgery.  __X__5.  Notify your doctor if there is any change in your medical condition      (cold, fever, infections).      Do NOT wear jewelry, make-up, hairpins, clips or nail polish. Do NOT wear lotions, powders, or perfumes.  Do NOT shave 48 hours prior to surgery. Men may shave face and neck. Do NOT bring valuables to the hospital.     [facility] is not responsible for any belongings or valuables.   Contacts, dentures/partials or body piercings may not be worn into surgery. Bring a  case for your contacts, glasses or hearing aids, a denture cup will be supplied.    Patients discharged the day of surgery will not be allowed to drive home.   __X__ Take these medicines the morning of surgery with A SIP OF WATER:     1. carvedilol (COREG)  2. omeprazole (PRILOSEC)     __X__ Shower prior to arrival.  __X__ Stop Blood Thinners: Eliquis. You reported stopping this already.  __X__ Stop Anti-inflammatories 7 days before surgery such as Advil, Ibuprofen, Motrin, BC or Goodies Powder, Naprosyn, Naproxen, Aleve, Aspirin, Meloxicam. May take Tylenol if needed for pain or discomfort.   __X__ Do not start taking any new herbal supplements or vitamins prior to your procedure.  __X__ Stop the following herbal supplements or vitamins:   Zinc  Ascorbic Acid (VITAMIN C)   Wear comfortable clothing (specific to your surgery type) to the hospital.  Plan for stool softeners for home use; pain medications have a tendency to cause constipation. You can also help prevent constipation by eating foods high in fiber such as fruits and vegetables and drinking plenty of fluids as your diet allows.  After surgery, you can prevent lung complications by doing breathing exercises.Take deep breaths and cough every 1-2 hours. Your doctor may order a device called an Incentive Spirometer to help you take deep breaths.  Please call the [place] Department at [phone] if you have any questions about these instructions

## 2020-07-09 DIAGNOSIS — Z950 Presence of cardiac pacemaker: Secondary | ICD-10-CM | POA: Diagnosis not present

## 2020-07-09 DIAGNOSIS — I442 Atrioventricular block, complete: Secondary | ICD-10-CM | POA: Diagnosis not present

## 2020-07-09 DIAGNOSIS — Z45018 Encounter for adjustment and management of other part of cardiac pacemaker: Secondary | ICD-10-CM | POA: Diagnosis not present

## 2020-07-09 LAB — SARS CORONAVIRUS 2 (TAT 6-24 HRS): SARS Coronavirus 2: NEGATIVE

## 2020-07-10 ENCOUNTER — Ambulatory Visit: Payer: Medicare Other | Admitting: Certified Registered"

## 2020-07-10 ENCOUNTER — Encounter
Admission: RE | Disposition: A | Discharge status: Home / Self Care | Payer: Self-pay | Source: Home / Self Care | Attending: Urology

## 2020-07-10 ENCOUNTER — Encounter: Payer: Self-pay | Admitting: Urology

## 2020-07-10 ENCOUNTER — Other Ambulatory Visit: Payer: Self-pay

## 2020-07-10 ENCOUNTER — Ambulatory Visit
Admission: RE | Admit: 2020-07-10 | Discharge: 2020-07-10 | Disposition: A | Discharge status: Home / Self Care | Payer: Medicare Other | Attending: Urology | Admitting: Urology

## 2020-07-10 DIAGNOSIS — I10 Essential (primary) hypertension: Secondary | ICD-10-CM | POA: Diagnosis not present

## 2020-07-10 DIAGNOSIS — N401 Enlarged prostate with lower urinary tract symptoms: Secondary | ICD-10-CM | POA: Diagnosis not present

## 2020-07-10 DIAGNOSIS — Z950 Presence of cardiac pacemaker: Secondary | ICD-10-CM | POA: Diagnosis not present

## 2020-07-10 DIAGNOSIS — I2510 Atherosclerotic heart disease of native coronary artery without angina pectoris: Secondary | ICD-10-CM | POA: Diagnosis not present

## 2020-07-10 DIAGNOSIS — Z87891 Personal history of nicotine dependence: Secondary | ICD-10-CM | POA: Diagnosis not present

## 2020-07-10 DIAGNOSIS — N323 Diverticulum of bladder: Secondary | ICD-10-CM | POA: Insufficient documentation

## 2020-07-10 DIAGNOSIS — Z7901 Long term (current) use of anticoagulants: Secondary | ICD-10-CM | POA: Diagnosis not present

## 2020-07-10 DIAGNOSIS — N342 Other urethritis: Secondary | ICD-10-CM | POA: Insufficient documentation

## 2020-07-10 DIAGNOSIS — M1990 Unspecified osteoarthritis, unspecified site: Secondary | ICD-10-CM | POA: Diagnosis not present

## 2020-07-10 DIAGNOSIS — N1330 Unspecified hydronephrosis: Secondary | ICD-10-CM | POA: Diagnosis not present

## 2020-07-10 DIAGNOSIS — N138 Other obstructive and reflux uropathy: Secondary | ICD-10-CM | POA: Diagnosis not present

## 2020-07-10 DIAGNOSIS — N3289 Other specified disorders of bladder: Secondary | ICD-10-CM | POA: Diagnosis not present

## 2020-07-10 DIAGNOSIS — Z8616 Personal history of COVID-19: Secondary | ICD-10-CM | POA: Diagnosis not present

## 2020-07-10 DIAGNOSIS — I4891 Unspecified atrial fibrillation: Secondary | ICD-10-CM | POA: Insufficient documentation

## 2020-07-10 DIAGNOSIS — E785 Hyperlipidemia, unspecified: Secondary | ICD-10-CM | POA: Diagnosis not present

## 2020-07-10 DIAGNOSIS — R3914 Feeling of incomplete bladder emptying: Secondary | ICD-10-CM | POA: Insufficient documentation

## 2020-07-10 DIAGNOSIS — K219 Gastro-esophageal reflux disease without esophagitis: Secondary | ICD-10-CM | POA: Diagnosis not present

## 2020-07-10 DIAGNOSIS — N400 Enlarged prostate without lower urinary tract symptoms: Secondary | ICD-10-CM | POA: Diagnosis present

## 2020-07-10 DIAGNOSIS — J439 Emphysema, unspecified: Secondary | ICD-10-CM | POA: Diagnosis not present

## 2020-07-10 DIAGNOSIS — R3912 Poor urinary stream: Secondary | ICD-10-CM

## 2020-07-10 DIAGNOSIS — R339 Retention of urine, unspecified: Secondary | ICD-10-CM | POA: Diagnosis not present

## 2020-07-10 HISTORY — PX: HOLEP-LASER ENUCLEATION OF THE PROSTATE WITH MORCELLATION: SHX6641

## 2020-07-10 SURGERY — ENUCLEATION, PROSTATE, USING LASER, WITH MORCELLATION
Anesthesia: General

## 2020-07-10 MED ORDER — FENTANYL CITRATE (PF) 100 MCG/2ML IJ SOLN
INTRAMUSCULAR | Status: AC
  Filled 2020-07-10: qty 2

## 2020-07-10 MED ORDER — ACETAMINOPHEN 10 MG/ML IV SOLN
INTRAVENOUS | Status: DC | PRN
  Administered 2020-07-10: 1000 mg via INTRAVENOUS

## 2020-07-10 MED ORDER — BELLADONNA ALKALOIDS-OPIUM 16.2-60 MG RE SUPP
RECTAL | Status: DC | PRN
Start: 2020-07-10 — End: 2020-07-10
  Administered 2020-07-10: 1 via RECTAL

## 2020-07-10 MED ORDER — BELLADONNA ALKALOIDS-OPIUM 16.2-60 MG RE SUPP
RECTAL | Status: AC
  Filled 2020-07-10: qty 1

## 2020-07-10 MED ORDER — FENTANYL CITRATE (PF) 100 MCG/2ML IJ SOLN
INTRAMUSCULAR | Status: DC | PRN
Start: 2020-07-10 — End: 2020-07-10
  Administered 2020-07-10: 100 ug via INTRAVENOUS

## 2020-07-10 MED ORDER — DEXAMETHASONE SODIUM PHOSPHATE 10 MG/ML IJ SOLN
INTRAMUSCULAR | Status: DC | PRN
  Administered 2020-07-10: 5 mg via INTRAVENOUS

## 2020-07-10 MED ORDER — CEFAZOLIN SODIUM-DEXTROSE 2-4 GM/100ML-% IV SOLN
INTRAVENOUS | Status: AC
  Filled 2020-07-10: qty 100

## 2020-07-10 MED ORDER — HYDROCODONE-ACETAMINOPHEN 5-325 MG PO TABS
1.0000 | ORAL_TABLET | ORAL | 0 refills | Status: AC | PRN
Start: 2020-07-10 — End: 2020-07-13

## 2020-07-10 MED ORDER — LIDOCAINE HCL (CARDIAC) PF 100 MG/5ML IV SOSY
PREFILLED_SYRINGE | INTRAVENOUS | Status: DC | PRN
  Administered 2020-07-10: 100 mg via INTRAVENOUS

## 2020-07-10 MED ORDER — CEFAZOLIN SODIUM-DEXTROSE 2-4 GM/100ML-% IV SOLN
2.0000 g | INTRAVENOUS | Status: AC
  Administered 2020-07-10: 2 g via INTRAVENOUS

## 2020-07-10 MED ORDER — PROPOFOL 10 MG/ML IV BOLUS
INTRAVENOUS | Status: DC | PRN
  Administered 2020-07-10: 150 mg via INTRAVENOUS

## 2020-07-10 MED ORDER — PROPOFOL 10 MG/ML IV BOLUS
INTRAVENOUS | Status: AC
  Filled 2020-07-10: qty 20

## 2020-07-10 MED ORDER — SULFAMETHOXAZOLE-TRIMETHOPRIM 800-160 MG PO TABS: 1.0000 | ORAL_TABLET | Freq: Every day | ORAL | 0 refills | Status: DC

## 2020-07-10 MED ORDER — ACETAMINOPHEN 10 MG/ML IV SOLN
INTRAVENOUS | Status: AC
  Filled 2020-07-10: qty 100

## 2020-07-10 MED ORDER — ORAL CARE MOUTH RINSE: 15.0000 mL | Freq: Once | OROMUCOSAL | Status: AC

## 2020-07-10 MED ORDER — LACTATED RINGERS IV SOLN: INTRAVENOUS | Status: DC

## 2020-07-10 MED ORDER — CHLORHEXIDINE GLUCONATE 0.12 % MT SOLN
15.0000 mL | Freq: Once | OROMUCOSAL | Status: AC
  Administered 2020-07-10: 15 mL via OROMUCOSAL

## 2020-07-10 MED ORDER — SUGAMMADEX SODIUM 200 MG/2ML IV SOLN
INTRAVENOUS | Status: DC | PRN
  Administered 2020-07-10: 200 mg via INTRAVENOUS

## 2020-07-10 MED ORDER — ROCURONIUM BROMIDE 100 MG/10ML IV SOLN
INTRAVENOUS | Status: DC | PRN
  Administered 2020-07-10: 50 mg via INTRAVENOUS

## 2020-07-10 MED ORDER — ONDANSETRON HCL 4 MG/2ML IJ SOLN
INTRAMUSCULAR | Status: DC | PRN
  Administered 2020-07-10: 4 mg via INTRAVENOUS

## 2020-07-10 MED ORDER — ONDANSETRON HCL 4 MG/2ML IJ SOLN: 4.0000 mg | Freq: Once | INTRAMUSCULAR | Status: DC | PRN

## 2020-07-10 MED ORDER — FENTANYL CITRATE (PF) 100 MCG/2ML IJ SOLN: 25.0000 ug | INTRAMUSCULAR | Status: DC | PRN

## 2020-07-10 MED ORDER — CHLORHEXIDINE GLUCONATE 0.12 % MT SOLN
OROMUCOSAL | Status: AC
  Filled 2020-07-10: qty 15

## 2020-07-10 MED ORDER — PHENYLEPHRINE HCL (PRESSORS) 10 MG/ML IV SOLN
INTRAVENOUS | Status: DC | PRN
  Administered 2020-07-10 (×2): 100 ug via INTRAVENOUS

## 2020-07-10 SURGICAL SUPPLY — 37 items
ADAPTER IRRIG TUBE 2 SPIKE SOL (ADAPTER) ×4 IMPLANT
ADPR TBG 2 SPK PMP STRL ASCP (ADAPTER) ×2
BAG DRN LRG CPC RND TRDRP CNTR (MISCELLANEOUS) ×1
BAG URO DRAIN 4000ML (MISCELLANEOUS) ×2 IMPLANT
CATH FOLEY 3WAY 30CC 24FR (CATHETERS) ×2
CATH URETL 5X70 OPEN END (CATHETERS) ×2 IMPLANT
CATH URTH STD 24FR FL 3W 2 (CATHETERS) ×1 IMPLANT
CONTAINER COLLECT MORCELLATR (MISCELLANEOUS) ×1 IMPLANT
DRAPE UTILITY 15X26 TOWEL STRL (DRAPES) IMPLANT
ELECT BIVAP BIPO 22/24 DONUT (ELECTROSURGICAL)
ELECTRD BIVAP BIPO 22/24 DONUT (ELECTROSURGICAL) IMPLANT
FILTER OVERFLOW MORCELLATOR (FILTER) ×1 IMPLANT
GLOVE BIOGEL PI IND STRL 7.5 (GLOVE) ×1 IMPLANT
GLOVE BIOGEL PI INDICATOR 7.5 (GLOVE) ×1
GOWN STRL REUS W/ TWL LRG LVL3 (GOWN DISPOSABLE) ×1 IMPLANT
GOWN STRL REUS W/ TWL XL LVL3 (GOWN DISPOSABLE) ×1 IMPLANT
GOWN STRL REUS W/TWL LRG LVL3 (GOWN DISPOSABLE) ×2
GOWN STRL REUS W/TWL XL LVL3 (GOWN DISPOSABLE) ×2
HOLDER FOLEY CATH W/STRAP (MISCELLANEOUS) ×2 IMPLANT
KIT TURNOVER CYSTO (KITS) ×2 IMPLANT
LASER FIBER FLEXIVA 550 (UROLOGICAL SUPPLIES) ×2 IMPLANT
MBRN O SEALING YLW 17 FOR INST (MISCELLANEOUS) ×2
MEMBRANE SLNG YLW 17 FOR INST (MISCELLANEOUS) ×1 IMPLANT
MORCELLATOR COLLECT CONTAINER (MISCELLANEOUS) ×2
MORCELLATOR OVERFLOW FILTER (FILTER) ×2
MORCELLATOR ROTATION 4.75 335 (MISCELLANEOUS) ×2 IMPLANT
PACK CYSTO AR (MISCELLANEOUS) ×2 IMPLANT
SET CYSTO W/LG BORE CLAMP LF (SET/KITS/TRAYS/PACK) ×2 IMPLANT
SET IRRIG Y TYPE TUR BLADDER L (SET/KITS/TRAYS/PACK) ×2 IMPLANT
SLEEVE PROTECTION STRL DISP (MISCELLANEOUS) ×4 IMPLANT
SOL .9 NS 3000ML IRR  AL (IV SOLUTION) ×8
SOL .9 NS 3000ML IRR AL (IV SOLUTION) ×4
SOL .9 NS 3000ML IRR UROMATIC (IV SOLUTION) ×4 IMPLANT
SURGILUBE 2OZ TUBE FLIPTOP (MISCELLANEOUS) ×2 IMPLANT
SYRINGE IRR TOOMEY STRL 70CC (SYRINGE) ×2 IMPLANT
TUBE PUMP MORCELLATOR PIRANHA (TUBING) ×2 IMPLANT
WATER STERILE IRR 1000ML POUR (IV SOLUTION) ×2 IMPLANT

## 2020-07-10 NOTE — Anesthesia Procedure Notes (Signed)
Procedure Name: Intubation Date/Time: 07/10/2020 8:00 AM Performed by: Chanetta Marshall, CRNA Pre-anesthesia Checklist: Patient identified, Emergency Drugs available, Suction available and Patient being monitored Patient Re-evaluated:Patient Re-evaluated prior to induction Oxygen Delivery Method: Circle system utilized Preoxygenation: Pre-oxygenation with 100% oxygen Induction Type: IV induction Ventilation: Mask ventilation without difficulty Laryngoscope Size: McGraph and 3 Grade View: Grade II Tube type: Oral Tube size: 7.5 mm Number of attempts: 1 Airway Equipment and Method: Stylet and Video-laryngoscopy Placement Confirmation: ETT inserted through vocal cords under direct vision,  positive ETCO2,  breath sounds checked- equal and bilateral and CO2 detector Secured at: 21 cm Tube secured with: Tape Dental Injury: Teeth and Oropharynx as per pre-operative assessment

## 2020-07-10 NOTE — Interval H&P Note (Signed)
UROLOGY H&P UPDATE  Agree with prior H&P dated 7/29.  Healthy 74 year old male with a long history of urinary symptoms of weak stream, urgency, and frequency with feeling of incomplete emptying.  Bladder scan was over 1 L in clinic.  We discussed again the differences between atonic bladder and bladder outlet obstruction, and that he may still have incomplete emptying if his incomplete emptying is primarily related to an atonic bladder.  We discussed possible need to perform intermittent catheterization in the future if PVRs remain significantly elevated despite an outlet procedure.  Cardiac: RRR Lungs: CTA bilaterally  Laterality: N/A Procedure: HOLEP  Urine: Culture 8/11 contaminated with 200 colonies staph, not clinically significant  We discussed the risks and benefits of HoLEP at length.  The procedure requires general anesthesia and takes 2 to 3 hours, and a holmium laser is used to enucleate the prostate and push this tissue into the bladder.  A morcellator is then used to remove this tissue, which is sent for pathology.  The vast majority of patients are able to discharge the same day with a catheter in place for 2 to 3 days, and will follow-up in clinic for a voiding trial.  Approximately 5% of patients will be admitted overnight to monitor the urine, or if they have multiple co-morbidities.  We specifically discussed the risks of bleeding, infection, retrograde ejaculation, temporary urgency and urge incontinence, very low risk of long-term incontinence, pathologic evaluation of prostate tissue and possible detection of prostate cancer or other malignancy, and possible need for additional procedures.   Billey Co, MD 07/10/2020

## 2020-07-10 NOTE — Anesthesia Preprocedure Evaluation (Addendum)
Anesthesia Evaluation  Patient identified by MRN, date of birth, ID band Patient awake    Reviewed: Allergy & Precautions, NPO status , Patient's Chart, lab work & pertinent test results  Airway Mallampati: III       Dental   Pulmonary former smoker,    Pulmonary exam normal        Cardiovascular hypertension, + CAD  + dysrhythmias + pacemaker + Cardiac Defibrillator      Neuro/Psych negative neurological ROS  negative psych ROS   GI/Hepatic Neg liver ROS, GERD  ,  Endo/Other    Renal/GU Renal disease     Musculoskeletal  (+) Arthritis , Osteoarthritis,    Abdominal Normal abdominal exam  (+)   Peds  Hematology negative hematology ROS (+)   Anesthesia Other Findings Past Medical History: No date: AICD (automatic cardioverter/defibrillator) present No date: Atrophic kidney     Comment:  left  No date: Cataract     Comment:  left eye mild  No date: Chicken pox No date: COVID-19     Comment:  2020/2021 ? month No date: Degenerative disc disease, lumbar     Comment:  L4/5 noted imaging No date: Dyslipidemia 05/05/2019: Encounter for care of pacemaker No date: Gallstones No date: GERD (gastroesophageal reflux disease) No date: History of esophageal stricture     Comment:  05/08/09 No date: History of kidney stones No date: Kidney stones No date: LVH (left ventricular hypertrophy)     Comment:  mild on imaging  No date: Presence of permanent cardiac pacemaker No date: Solitary kidney     Comment:  left kidney is atrophic   Reproductive/Obstetrics                             Anesthesia Physical Anesthesia Plan  ASA: III  Anesthesia Plan: General   Post-op Pain Management:    Induction: Intravenous  PONV Risk Score and Plan:   Airway Management Planned: Oral ETT  Additional Equipment:   Intra-op Plan:   Post-operative Plan: Extubation in OR  Informed Consent: I have  reviewed the patients History and Physical, chart, labs and discussed the procedure including the risks, benefits and alternatives for the proposed anesthesia with the patient or authorized representative who has indicated his/her understanding and acceptance.     Dental advisory given  Plan Discussed with: CRNA and Surgeon  Anesthesia Plan Comments:         Anesthesia Quick Evaluation

## 2020-07-10 NOTE — Op Note (Signed)
Date of procedure: 07/10/20  Preoperative diagnosis:  1. BPH with incomplete bladder emptying  Postoperative diagnosis:  1. Same 2. Severely trabeculated bladder  Procedure: 1. HoLEP (Holmium Laser Enucleation of the Prostate)  Surgeon: Nickolas Madrid, MD  Anesthesia: General  Complications: None  Intraoperative findings:  1.  Small, fibrotic prostate with high bladder neck 2.  Severe bladder trabeculations and diverticula throughout.  Right ureteral orifice clearly identified, left ureteral orifice appeared to be Hutch diverticulum and difficult to identify 3.  Uncomplicated HOLEP, excellent hemostasis  EBL: Minimal  Specimens: Prostate chips  Enucleation time: 32 minutes  Morcellation time: 4 minutes  Intra-op weight: 20 g  Drains: 24 French three-way, 30 cc in balloon  Indication: TYKWON FERA is a 74 y.o. patient with BPH and incomplete bladder emptying with PVRs greater than 1 L in clinic and bothersome urinary symptoms of weak dribbling stream, urgency, and frequency with feeling of incomplete emptying.  Prostate measured 17 cc and he had severe bladder trabeculations on cystoscopy.  We discussed at length options for management and the possibility of an atonic bladder versus outlet obstruction, and the role of urodynamics, and he elected to proceed directly with the outlet procedure.  We discussed the possibility of an atonic bladder and persistent incomplete emptying despite an outlet procedure and potential need for intermittent catheterization in the future.  After reviewing the management options for treatment, they elected to proceed with the above surgical procedure(s). We have discussed the potential benefits and risks of the procedure, side effects of the proposed treatment, the likelihood of the patient achieving the goals of the procedure, and any potential problems that might occur during the procedure or recuperation.  We specifically discussed the risks of  bleeding, infection, hematuria and clot retention, need for additional procedures, possible overnight hospital stay, temporary urgency and incontinence, rare long-term incontinence, and retrograde ejaculation.  Informed consent has been obtained.   Description of procedure:  The patient was taken to the operating room and general anesthesia was induced.  The patient was placed in the dorsal lithotomy position, prepped and draped in the usual sterile fashion, and preoperative antibiotics(Ancef) were administered.  SCDs were placed for DVT prophylaxis.  A preoperative time-out was performed.   Leander Rams sounds were used to gently dilated the urethra up to 42F. The 5 French continuous flow resectoscope was inserted into the urethra using the visual obturator  The prostate was short with a high bladder neck. The bladder was thoroughly inspected and notable for severe trabeculations throughout.  The ureteral orifice on the right side was clearly identified, however the left was more challenging, and appeared to be involved with a Hutch diverticulum which is consistent with his preop imaging.  The laser was set to 2 J and 50 Hz and was used to make an incision at the 6 o'clock position to the level of the capsule from the bladder neck to the verumontanum.  The bladder neck was lowered to the base of the bladder.  The lateral lobes were then incised circumferentially until they were disconnected from the surrounding tissue.  The capsule was examined and laser was used for meticulous hemostasis.    The 71 French resectoscope was then switched out for the 42 French nephroscope and the lobes were morcellated and the tissue sent to pathology.  A 24 French three-way catheter was inserted easily, and CBI was initiated.  30 cc were placed in the balloon.  Urine was faint pink.  The catheter irrigated  easily with a Toomey syringe.  A belladonna suppository was placed.  The patient tolerated the procedure well without  any immediate complications and was extubated and transferred to the recovery room in stable condition.  Urine was clear on fast CBI.  Disposition: Stable to PACU  Plan: Wean CBI in PACU, anticipate discharge home today with void trial in clinic in 2-3 days Okay to resume Eliquis on Tuesday 8/31  Nickolas Madrid, MD 07/10/2020

## 2020-07-10 NOTE — Transfer of Care (Signed)
Immediate Anesthesia Transfer of Care Note  Patient: [name]  Procedure(s) Performed: HOLEP-LASER ENUCLEATION OF THE PROSTATE WITH MORCELLATION (N/A )  Patient Location: PACU  Anesthesia Type:General  Level of Consciousness: awake, alert  and oriented  Airway & Oxygen Therapy: Patient Spontanous Breathing  Post-op Assessment: Report given to RN and Post -op Vital signs reviewed and stable  Post vital signs: Reviewed and stable  Last Vitals:  Vitals Value Taken Time  BP    Temp    Pulse    Resp    SpO2      Last Pain:  Vitals:   07/10/20 0728  PainSc: 0-No pain         Complications: No complications documented.

## 2020-07-10 NOTE — Anesthesia Postprocedure Evaluation (Signed)
Anesthesia Post Note  Patient: [name]  Procedure(s) Performed: HOLEP-LASER ENUCLEATION OF THE PROSTATE WITH MORCELLATION (N/A )  Patient location during evaluation: PACU Anesthesia Type: General Level of consciousness: awake and alert and oriented Pain management: pain level controlled Vital Signs Assessment: post-procedure vital signs reviewed and stable Respiratory status: spontaneous breathing Cardiovascular status: blood pressure returned to baseline Anesthetic complications: no   No complications documented.   Last Vitals:  Vitals:   07/10/20 1018 07/10/20 1109  BP: (!) 149/93 139/82  Pulse: (!) 59 (!) 59  Resp: 18 18  Temp: 36.6 C (!) 36.1 C  SpO2: 100% 100%    Last Pain:  Vitals:   07/10/20 1109  TempSrc: Temporal  PainSc: 0-No pain                 [name]

## 2020-07-13 ENCOUNTER — Other Ambulatory Visit: Payer: Self-pay

## 2020-07-13 ENCOUNTER — Ambulatory Visit: Payer: Medicare Other | Admitting: Physician Assistant

## 2020-07-13 ENCOUNTER — Encounter: Payer: Self-pay | Admitting: Physician Assistant

## 2020-07-13 ENCOUNTER — Ambulatory Visit (INDEPENDENT_AMBULATORY_CARE_PROVIDER_SITE_OTHER): Payer: Medicare Other | Admitting: Physician Assistant

## 2020-07-13 VITALS — BP 134/85 | HR 70 | Ht 73.0 in | Wt 209.0 lb

## 2020-07-13 DIAGNOSIS — N401 Enlarged prostate with lower urinary tract symptoms: Secondary | ICD-10-CM

## 2020-07-13 DIAGNOSIS — R3912 Poor urinary stream: Secondary | ICD-10-CM

## 2020-07-13 LAB — BLADDER SCAN AMB NON-IMAGING: Scan Result: 65 mL

## 2020-07-13 NOTE — Progress Notes (Signed)
Fill and Pull Catheter Removal  Patient is present today for a catheter removal.  Patient was cleaned and prepped in a sterile fashion 226ml of sterile water was instilled into the bladder when the patient felt the urge to urinate. 66ml of water was then drained from the balloon.  A 24FR three-way foley cath was removed from the bladder no complications were noted .  Patient was then given some time to void on their own.  Patient can void  159ml on their own after some time.  Patient tolerated well.  Performed by: Debroah Loop, PA-C   Follow up/ Additional notes: Push fluids and RTC this afternoon for PVR.

## 2020-07-13 NOTE — Patient Instructions (Signed)

## 2020-07-13 NOTE — Progress Notes (Addendum)
Afternoon follow-up  Patient returned to clinic this afternoon for repeat PVR. He reports drinking approximately 36oz of fluid. He has been able to urinate. He has has urinary leakage. PVR 79mL.  Results for orders placed or performed in visit on 07/13/20  BLADDER SCAN AMB NON-IMAGING  Result Value Ref Range   Scan Result 65 mL    Voiding trial passed. Counseled patient to start Kegel exercises 3x10 sets daily and start timed voiding during the day, voiding no less than every 3 hours.  Follow up: as scheduled

## 2020-07-14 ENCOUNTER — Telehealth: Payer: Self-pay

## 2020-07-14 LAB — SURGICAL PATHOLOGY

## 2020-07-14 NOTE — Telephone Encounter (Signed)
Called pt informed him of the information below. Pt gave verbal understanding.  

## 2020-07-14 NOTE — Telephone Encounter (Signed)
-----   Message from Billey Co, MD sent at 07/14/2020 11:27 AM EDT ----- Good news, no cancer in HOLEP specimen, keep follow up as scheduled  Nickolas Madrid, MD 07/14/2020

## 2020-08-23 DIAGNOSIS — Z23 Encounter for immunization: Secondary | ICD-10-CM | POA: Diagnosis not present

## 2020-09-03 ENCOUNTER — Other Ambulatory Visit: Payer: Self-pay

## 2020-09-03 ENCOUNTER — Encounter: Payer: Self-pay | Admitting: Urology

## 2020-09-03 ENCOUNTER — Ambulatory Visit (INDEPENDENT_AMBULATORY_CARE_PROVIDER_SITE_OTHER): Payer: Medicare Other | Admitting: Urology

## 2020-09-03 VITALS — BP 126/82 | HR 80 | Ht 73.0 in | Wt 207.4 lb

## 2020-09-03 DIAGNOSIS — R3912 Poor urinary stream: Secondary | ICD-10-CM

## 2020-09-03 DIAGNOSIS — N401 Enlarged prostate with lower urinary tract symptoms: Secondary | ICD-10-CM

## 2020-09-03 LAB — BLADDER SCAN AMB NON-IMAGING

## 2020-09-03 NOTE — Progress Notes (Signed)
   09/03/2020 2:19 PM   [name] 01-03-1946 [id number]  Reason for visit: Follow up BPH status post HOLEP  HPI: I saw [name] back in urology clinic for 6-week follow-up after HOLEP.  He is a 74 year old male who presented in July 2021 with severe urinary symptoms of weak dribbling stream, urgency, frequency, urge incontinence, and elevated PVRs greater than 1 L in clinic.  Transrectal ultrasound showed a 17 g prostate and cystoscopy showed a distended bladder with severe bladder trabeculations.  He underwent an uncomplicated HoLEP on 0/17/5102 with removal of 13 g of benign prostate tissue.  He has been doing extremely well since surgery.  He denies any incontinence or urgency and is voiding with a strong stream.  He has nocturia 0-1 time per night.  IPSS score today is 1 with quality of life delighted.  He is performing timed voiding every 3-4 hours during the day, and right before bed.  Doing excellent after HOLEP.  We discussed the need for ongoing timed voiding with his massively distended and trabeculated bladder, and the risks of long-term bladder damage with his long history of BPH symptoms and incomplete emptying.  I recommended ongoing yearly follow-up for PVRs with his history of incomplete bladder emptying and severely trabeculated bladder.  RTC 6 months with PVR  Billey Co, MD  [facility] [address] [place], [place] [address] [phone]

## 2020-09-06 DIAGNOSIS — Z23 Encounter for immunization: Secondary | ICD-10-CM | POA: Diagnosis not present

## 2020-10-08 DIAGNOSIS — Z45018 Encounter for adjustment and management of other part of cardiac pacemaker: Secondary | ICD-10-CM | POA: Diagnosis not present

## 2020-10-08 DIAGNOSIS — I442 Atrioventricular block, complete: Secondary | ICD-10-CM | POA: Diagnosis not present

## 2020-10-08 DIAGNOSIS — Z950 Presence of cardiac pacemaker: Secondary | ICD-10-CM | POA: Diagnosis not present

## 2020-10-29 ENCOUNTER — Other Ambulatory Visit: Payer: Self-pay | Admitting: Cardiology

## 2020-10-29 ENCOUNTER — Telehealth: Payer: Self-pay | Admitting: Cardiology

## 2020-10-29 DIAGNOSIS — I480 Paroxysmal atrial fibrillation: Secondary | ICD-10-CM

## 2020-11-02 ENCOUNTER — Other Ambulatory Visit: Payer: Self-pay | Admitting: Internal Medicine

## 2020-11-02 ENCOUNTER — Other Ambulatory Visit: Payer: Self-pay

## 2020-11-02 DIAGNOSIS — I480 Paroxysmal atrial fibrillation: Secondary | ICD-10-CM

## 2020-11-02 DIAGNOSIS — K219 Gastro-esophageal reflux disease without esophagitis: Secondary | ICD-10-CM

## 2020-11-02 MED ORDER — APIXABAN 5 MG PO TABS: ORAL_TABLET | ORAL | 0 refills | Status: DC

## 2020-11-02 MED ORDER — OMEPRAZOLE 40 MG PO CPDR: 40.0000 mg | DELAYED_RELEASE_CAPSULE | Freq: Every day | ORAL | 3 refills | Status: DC

## 2020-11-12 NOTE — Telephone Encounter (Signed)
Done

## 2021-01-09 DIAGNOSIS — Z45018 Encounter for adjustment and management of other part of cardiac pacemaker: Secondary | ICD-10-CM | POA: Diagnosis not present

## 2021-01-09 DIAGNOSIS — I442 Atrioventricular block, complete: Secondary | ICD-10-CM | POA: Diagnosis not present

## 2021-01-09 DIAGNOSIS — Z950 Presence of cardiac pacemaker: Secondary | ICD-10-CM | POA: Diagnosis not present

## 2021-02-21 NOTE — Progress Notes (Signed)
Primary Physician/Referring:  McLean-Scocuzza, Nino Glow, MD  Patient ID: [name], male    DOB: 1946/04/23, 75 y.o.   MRN: [id number]  Chief Complaint  Patient presents with  . Atrial Fibrillation  . Follow-up    HPI: [name]  is a 75 y.o. male  with hypertension, hyperlipidemia, coronary calcification by CT chest in 2015, unilateral kidney due to congenital absence of left kidney, complete heart block S/P St. Jude PM 2240 Dual chamber pacemaker implant 02/05/2014: Olin Pia, MD, paroxysmal atrial fibrillation noted on pacemaker transmission, has been on Eliquis since 06/24/2019. He has history of known intolerance to statins, however on low dose of pravastatin, his lipids are improved significantly.  Patient presents for annual follow up for atrial fibrillation, hypertension, hyperlipidemia, and heart block.  Patient remains asymptomatic and continues to tolerate medications without issue.  Patient denies chest pain, palpitations, dyspnea, syncope, near syncope, dizziness.  Patient denies leg swelling, clubbing, PND.  Notably patient did undergo surgery for BPH 07/10/2020, which he tolerated well and has recuperated.  Patient continues to play golf several times per week and works at the greens for [employer], walking throughout most of the day without issue.  Past Medical History:  Diagnosis Date  . AICD (automatic cardioverter/defibrillator) present   . Atrophic kidney    left   . Cataract    left eye mild   . Chicken pox   . COVID-19    2020/2021 ? month  . Degenerative disc disease, lumbar    L4/5 noted imaging  . Dyslipidemia   . Encounter for care of pacemaker 05/05/2019  . Gallstones   . GERD (gastroesophageal reflux disease)   . History of esophageal stricture    05/08/09  . History of kidney stones   . Kidney stones   . LVH (left ventricular hypertrophy)    mild on imaging   . Presence of permanent cardiac pacemaker   . Solitary kidney    left kidney is  atrophic     Past Surgical History:  Procedure Laterality Date  . FOOT SURGERY     right mortons neuroma   . HOLEP-LASER ENUCLEATION OF THE PROSTATE WITH MORCELLATION N/A 07/10/2020   Procedure: HOLEP-LASER ENUCLEATION OF THE PROSTATE WITH MORCELLATION;  Surgeon: Billey Co, MD;  Location: [facility] ORS;  Service: Urology;  Laterality: N/A;  . PACEMAKER PLACEMENT     2015 Dr. Caryl Comes for AV block  . PERMANENT PACEMAKER INSERTION N/A 02/05/2014   Procedure: PERMANENT PACEMAKER INSERTION;  Surgeon: Deboraha Sprang, MD;  Location: [facility] CATH LAB;  Service: Cardiovascular;  Laterality: N/A;   Family History  Problem Relation Age of Onset  . Diabetes Mother        died of complications   . Cancer Father        bladder   . Stroke Father    Social History   Tobacco Use  . Smoking status: Former Smoker    Packs/day: 1.00    Years: 20.00    Pack years: 20.00    Types: Cigarettes    Quit date: 10/21/1991    Years since quitting: 29.3  . Smokeless tobacco: Never Used  . Tobacco comment: smoked [id number] <1 ppd no FH lung cancer   Substance Use Topics  . Alcohol use: Yes    Comment: Drinks 2-3 ounces of liquor daily   Marital status: Married  Review of Systems  Constitutional: Negative for malaise/fatigue and weight gain.  Cardiovascular: Negative for chest pain,  claudication, dyspnea on exertion, leg swelling, near-syncope, orthopnea, palpitations, paroxysmal nocturnal dyspnea and syncope.  Respiratory: Negative for shortness of breath.   Hematologic/Lymphatic: Does not bruise/bleed easily.  Musculoskeletal: Positive for joint pain (bilateral knees).  Gastrointestinal: Negative for melena.  Neurological: Negative for dizziness and weakness.   Objective  Blood pressure 125/90, pulse 82, temperature 98.1 F (36.7 C), height 6\' 1"  (1.854 m), weight 209 lb (94.8 kg), SpO2 100 %. Body mass index is 27.57 kg/m.   Vitals with BMI 02/22/2021 09/03/2020 07/13/2020  Height 6\' 1"  6\' 1"  6\' 1"    Weight 209 lbs 207 lbs 6 oz 209 lbs  BMI 27.58 62.94 76.54  Systolic 650 354 656  Diastolic 90 82 85  Pulse 82 80 70       Physical Exam Vitals reviewed.  HENT:     Head: Normocephalic and atraumatic.  Cardiovascular:     Rate and Rhythm: Normal rate and regular rhythm.     Pulses: Intact distal pulses.     Heart sounds: Normal heart sounds, S1 normal and S2 normal. No murmur heard. No gallop.      Comments: No leg edema, no JVD. Pulmonary:     Effort: Pulmonary effort is normal. No respiratory distress.     Breath sounds: Normal breath sounds. No wheezing, rhonchi or rales.  Abdominal:     General: Bowel sounds are normal.     Palpations: Abdomen is soft.  Musculoskeletal:     Right lower leg: No edema.     Left lower leg: No edema.  Skin:    General: Skin is warm and dry.     Capillary Refill: Capillary refill takes less than 2 seconds.  Neurological:     Mental Status: He is alert.    Radiology: No results found.  Laboratory examination:    CMP Latest Ref Rng & Units 05/01/2020 11/22/2018 05/11/2018  Glucose 70 - 99 mg/dL 102(H) 108(H) 141(H)  BUN 6 - 23 mg/dL 14 17 13   Creatinine 0.40 - 1.50 mg/dL 0.95 1.11 1.09  Sodium 135 - 145 mEq/L 138 141 141  Potassium 3.5 - 5.1 mEq/L 4.3 4.3 4.2  Chloride 96 - 112 mEq/L 105 105 107  CO2 19 - 32 mEq/L 29 28 27   Calcium 8.4 - 10.5 mg/dL 9.4 9.2 9.1  Total Protein 6.0 - 8.3 g/dL 6.7 6.4 -  Total Bilirubin 0.2 - 1.2 mg/dL 1.1 0.8 -  Alkaline Phos 39 - 117 U/L 66 48 -  AST 0 - 37 U/L 18 24 -  ALT 0 - 53 U/L 17 30 -   CBC Latest Ref Rng & Units 05/01/2020 05/24/2019 11/22/2018  WBC 4.0 - 10.5 K/uL 5.2 5.2 4.5  Hemoglobin 13.0 - 17.0 g/dL 14.5 14.7 14.9  Hematocrit 39.0 - 52.0 % 42.8 42.4 44.5  Platelets 150.0 - 400.0 K/uL 148.0(L) 161 137.0(L)   Lipid Panel     Component Value Date/Time   CHOL 155 05/01/2020 1019   TRIG 71.0 05/01/2020 1019   HDL 64.80 05/01/2020 1019   CHOLHDL 2 05/01/2020 1019   VLDL 14.2 05/01/2020  1019   LDLCALC 76 05/01/2020 1019   HEMOGLOBIN A1C Lab Results  Component Value Date   HGBA1C 5.6 05/11/2018   TSH No results for input(s): TSH in the last 8760 hours.   Medications and allergies   Current Meds  Medication Sig  . apixaban (ELIQUIS) 5 MG TABS tablet TAKE 1 TABLET(5 MG) BY MOUTH TWICE DAILY  . Ascorbic Acid (VITAMIN C)  1000 MG tablet Take 1,000 mg by mouth daily.  . carvedilol (COREG) 6.25 MG tablet Take 1 tablet (6.25 mg total) by mouth 2 (two) times daily with a meal.  . Cholecalciferol (VITAMIN D3) 125 MCG (5000 UT) TABS Take 5,000 Units by mouth daily.   Marland Kitchen omeprazole (PRILOSEC) 40 MG capsule Take 1 capsule (40 mg total) by mouth daily. 30 minutes before breakfast appt more refills  . pravastatin (PRAVACHOL) 40 MG tablet Take 40 mg by mouth at bedtime.   . Zinc 50 MG CAPS Take 50 mg by mouth daily.   \ Allergies  Allergen Reactions  . Lipitor [Atorvastatin] Other (See Comments)    myalgias  . Lisinopril Cough       . Simvastatin Other (See Comments)    myalgias     Cardiac Studies:   Abdominal aortic duplex 04/25/2017: Focal plaque noted in the proximal, mid and distal aorta. Clinical correlation is suggested. Aortic ectasia noted maximum measuring 2.9 x 2.91 x 2.88 cm. no AAA. Enlarged inferior vena cava suggests elevated central venous pressure. Consider rescreening in 5 years.    Echocardiogram 02/05/2014: Normal LV systolic function, EF 50-93%, mild LVH.  Mild TR.  Scheduled Remote pacemaker check 01/09/2021:  Brief PAT < 10 S. There was a <1 % cumulative atrial arrhythmia burden. There were 0 high ventricular rate episodes detected. Health trends do not demonstrate significant abnormality. Battery longevity is 7.0-7.6 years. RA pacing is 82.0 %, RV pacing is 99.0 %.  EKG:  02/22/2021: AV paced rhythm at a rate of 60 bpm, no further analysis.  Assessment     ICD-10-CM   1. Paroxysmal atrial fibrillation (HCC)  I48.0 EKG 12-Lead  2. Essential  hypertension  I10   3. Hypercholesteremia  E78.00   4. Coronary artery calcification seen on CAT scan  I25.10   5. Pacemaker St. Jude PM  2240 Dual chamber pacemaker implant  02/05/2014  Z95.0     No orders of the defined types were placed in this encounter.  Medications Discontinued During This Encounter  Medication Reason  . oxybutynin (DITROPAN-XL) 5 MG 24 hr tablet Error    This patients CHA2DS2-VASc Score 3 (HTN, Vasc, Age)and yearly risk of stroke 3.2%.   Recommendations:   KAYDN KUMPF  is a 75 y.o. male  with hypertension, hyperlipidemia, coronary calcification by CT chest in 2015, unilateral kidney due to congenital absence of left kidney, complete heart block S/P St. Jude PM 2240 Dual chamber pacemaker implant 02/05/2014: Olin Pia, MD, paroxysmal atrial fibrillation noted on pacemaker transmission, has been on Eliquis since 06/24/2019. He has history of known tolerance to statins, however on low dose of pravastatin, his lipids are improved significantly.  Patient presents for annual follow up for atrial fibrillation, hypertension, hyperlipidemia, and heart block.  Patient remains asymptomatic, and there are no clinical signs of heart failure.  Review of previous lipid profile testing shows lipids are well controlled, will continue pravastatin.  In view of coronary calcification on CT in 2015, continue with aggressive lipid management.  Reviewed and discussed with patient regarding pacemaker transmissions, pacemaker is functioning normally.  Patient's blood pressure is well controlled.  Make changes to cardiovascular medications at this time.  Patient continues to tolerate anticoagulation without bleeding diathesis.  Continue Eliquis.  Notably patient has follow-up scheduled with PCP in approximately 1 month at which time patient believes he will have repeat lipid profile testing done.  Follow-up in 1 year, sooner if needed, for hypertension, hyperlipidemia, paroxysmal atrial  fibrillation, and vascular risk modification.   Alethia Berthold, PA-C 02/22/2021, 8:55 AM Office: [phone]

## 2021-02-22 ENCOUNTER — Other Ambulatory Visit: Payer: Self-pay

## 2021-02-22 ENCOUNTER — Ambulatory Visit: Payer: Medicare Other | Admitting: Cardiology

## 2021-02-22 ENCOUNTER — Encounter: Payer: Self-pay | Admitting: Student

## 2021-02-22 ENCOUNTER — Ambulatory Visit: Payer: Medicare Other | Admitting: Student

## 2021-02-22 VITALS — BP 125/90 | HR 82 | Temp 98.1°F | Ht 73.0 in | Wt 209.0 lb

## 2021-02-22 DIAGNOSIS — I10 Essential (primary) hypertension: Secondary | ICD-10-CM

## 2021-02-22 DIAGNOSIS — Z950 Presence of cardiac pacemaker: Secondary | ICD-10-CM | POA: Diagnosis not present

## 2021-02-22 DIAGNOSIS — I2510 Atherosclerotic heart disease of native coronary artery without angina pectoris: Secondary | ICD-10-CM

## 2021-02-22 DIAGNOSIS — E7800 Pure hypercholesterolemia, unspecified: Secondary | ICD-10-CM | POA: Diagnosis not present

## 2021-02-22 DIAGNOSIS — I480 Paroxysmal atrial fibrillation: Secondary | ICD-10-CM | POA: Diagnosis not present

## 2021-03-04 ENCOUNTER — Ambulatory Visit: Payer: Self-pay | Admitting: Urology

## 2021-03-25 ENCOUNTER — Other Ambulatory Visit: Payer: Self-pay

## 2021-03-25 ENCOUNTER — Encounter: Payer: Self-pay | Admitting: Urology

## 2021-03-25 ENCOUNTER — Ambulatory Visit (INDEPENDENT_AMBULATORY_CARE_PROVIDER_SITE_OTHER): Payer: Medicare Other | Admitting: Urology

## 2021-03-25 VITALS — BP 125/85 | HR 80 | Ht 73.0 in | Wt 200.0 lb

## 2021-03-25 DIAGNOSIS — R3912 Poor urinary stream: Secondary | ICD-10-CM

## 2021-03-25 DIAGNOSIS — N401 Enlarged prostate with lower urinary tract symptoms: Secondary | ICD-10-CM | POA: Diagnosis not present

## 2021-03-25 LAB — BLADDER SCAN AMB NON-IMAGING

## 2021-03-25 NOTE — Progress Notes (Signed)
   03/25/2021 2:51 PM   [name] 14-Jul-1946 [id number]  Reason for visit: Follow up BPH status post HOLEP  HPI: I saw [name] back in urology clinic after [facility] on 07/10/2020.  He is a 75 year old male who presented in July 2021 with severe urinary symptoms of weak dribbling stream, urgency, frequency, urge incontinence, and elevated PVRs greater than 1 L in clinic.  Transrectal ultrasound showed a 17 g prostate and cystoscopy showed a distended bladder with severe bladder trabeculations.  He underwent an uncomplicated HoLEP on 3/61/4431 with removal of 13 g of benign prostate tissue.  He has been doing extremely well since surgery.  He denies any incontinence or urgency and is voiding with a strong stream.  PVR is normal at 127 mL.  He is starting to get improved sensation in the bladder, and is continuing to work on timed voiding every 3-4 hours during the day.  RTC 1 year for PVR.  With his severe bladder trabeculations and history of significantly elevated PVRs with overflow incontinence would recommend continuing yearly PVRs at least for the next few years  Billey Co, [place] [address] [place], [place] [address] [phone]

## 2021-03-29 ENCOUNTER — Ambulatory Visit (INDEPENDENT_AMBULATORY_CARE_PROVIDER_SITE_OTHER): Payer: Medicare Other

## 2021-03-29 VITALS — Ht 73.0 in | Wt 200.0 lb

## 2021-03-29 DIAGNOSIS — Z0000 Encounter for general adult medical examination without abnormal findings: Secondary | ICD-10-CM

## 2021-03-29 NOTE — Patient Instructions (Addendum)
[name] , Thank you for taking time to come for your Medicare Wellness Visit. I appreciate your ongoing commitment to your health goals. Please review the following plan we discussed and let me know if I can assist you in the future.   These are the goals we discussed: Goals    . Healthy Diet     Low cholesterol       This is a list of the screening recommended for you and due dates:  Health Maintenance  Topic Date Due  . COVID-19 Vaccine (1) Never done  . Tetanus Vaccine  03/29/2022*  . Flu Shot  06/14/2021  . Colon Cancer Screening  01/23/2028  . Hepatitis C Screening: USPSTF Recommendation to screen - Ages 74-79 yo.  Completed  . Pneumonia vaccines  Completed  . HPV Vaccine  Aged Out  *Topic was postponed. The date shown is not the original due date.    Immunizations Immunization History  Administered Date(s) Administered  . Influenza, High Dose Seasonal PF 09/28/2018  . Influenza-Unspecified 08/23/2017  . Pneumococcal Conjugate-13 10/20/2017  . Pneumococcal Polysaccharide-23 11/22/2018   Keep all routine maintenance appointments.   Next scheduled lab 04/26/21 @ 8:00  Follow up 04/30/21 @ 10:30  Advanced directives: not yet completed.   Conditions/risks identified: none new.  Next appointment: Follow up in one year for your annual wellness visit.   Preventive Care 81 Years and Older, Male Preventive care refers to lifestyle choices and visits with your health care provider that can promote health and wellness. What does preventive care include?  A yearly physical exam. This is also called an annual well check.  Dental exams once or twice a year.  Routine eye exams. Ask your health care provider how often you should have your eyes checked.  Personal lifestyle choices, including:  Daily care of your teeth and gums.  Regular physical activity.  Eating a healthy diet.  Avoiding tobacco and drug use.  Limiting alcohol use.  Practicing safe  sex.  Taking low doses of aspirin every day.  Taking vitamin and mineral supplements as recommended by your health care provider. What happens during an annual well check? The services and screenings done by your health care provider during your annual well check will depend on your age, overall health, lifestyle risk factors, and family history of disease. Counseling  Your health care provider may ask you questions about your:  Alcohol use.  Tobacco use.  Drug use.  Emotional well-being.  Home and relationship well-being.  Sexual activity.  Eating habits.  History of falls.  Memory and ability to understand (cognition).  Work and work Statistician. Screening  You may have the following tests or measurements:  Height, weight, and BMI.  Blood pressure.  Lipid and cholesterol levels. These may be checked every 5 years, or more frequently if you are over 47 years old.  Skin check.  Lung cancer screening. You may have this screening every year starting at age 74 if you have a 30-pack-year history of smoking and currently smoke or have quit within the past 15 years.  Fecal occult blood test (FOBT) of the stool. You may have this test every year starting at age 74.  Flexible sigmoidoscopy or colonoscopy. You may have a sigmoidoscopy every 5 years or a colonoscopy every 10 years starting at age 74.  Prostate cancer screening. Recommendations will vary depending on your family history and other risks.  Hepatitis C blood test.  Hepatitis B blood test.  Sexually transmitted  disease (STD) testing.  Diabetes screening. This is done by checking your blood sugar (glucose) after you have not eaten for a while (fasting). You may have this done every 1-3 years.  Abdominal aortic aneurysm (AAA) screening. You may need this if you are a current or former smoker.  Osteoporosis. You may be screened starting at age 74 if you are at high risk. Talk with your health care provider  about your test results, treatment options, and if necessary, the need for more tests. Vaccines  Your health care provider may recommend certain vaccines, such as:  Influenza vaccine. This is recommended every year.  Tetanus, diphtheria, and acellular pertussis (Tdap, Td) vaccine. You may need a Td booster every 10 years.  Zoster vaccine. You may need this after age 74.  Pneumococcal 13-valent conjugate (PCV13) vaccine. One dose is recommended after age 74.  Pneumococcal polysaccharide (PPSV23) vaccine. One dose is recommended after age 74. Talk to your health care provider about which screenings and vaccines you need and how often you need them. This information is not intended to replace advice given to you by your health care provider. Make sure you discuss any questions you have with your health care provider. Document Released: 11/27/2015 Document Revised: 07/20/2016 Document Reviewed: 09/01/2015 Elsevier Interactive Patient Education  2017 [employer] Prevention in the Home Falls can cause injuries. They can happen to people of all ages. There are many things you can do to make your home safe and to help prevent falls. What can I do on the outside of my home?  Regularly fix the edges of walkways and driveways and fix any cracks.  Remove anything that might make you trip as you walk through a door, such as a raised step or threshold.  Trim any bushes or trees on the path to your home.  Use bright outdoor lighting.  Clear any walking paths of anything that might make someone trip, such as rocks or tools.  Regularly check to see if handrails are loose or broken. Make sure that both sides of any steps have handrails.  Any raised decks and porches should have guardrails on the edges.  Have any leaves, snow, or ice cleared regularly.  Use sand or salt on walking paths during winter.  Clean up any spills in your garage right away. This includes oil or grease  spills. What can I do in the bathroom?  Use night lights.  Install grab bars by the toilet and in the tub and shower. Do not use towel bars as grab bars.  Use non-skid mats or decals in the tub or shower.  If you need to sit down in the shower, use a plastic, non-slip stool.  Keep the floor dry. Clean up any water that spills on the floor as soon as it happens.  Remove soap buildup in the tub or shower regularly.  Attach bath mats securely with double-sided non-slip rug tape.  Do not have throw rugs and other things on the floor that can make you trip. What can I do in the bedroom?  Use night lights.  Make sure that you have a light by your bed that is easy to reach.  Do not use any sheets or blankets that are too big for your bed. They should not hang down onto the floor.  Have a firm chair that has side arms. You can use this for support while you get dressed.  Do not have throw rugs and other things on  the floor that can make you trip. What can I do in the kitchen?  Clean up any spills right away.  Avoid walking on wet floors.  Keep items that you use a lot in easy-to-reach places.  If you need to reach something above you, use a strong step stool that has a grab bar.  Keep electrical cords out of the way.  Do not use floor polish or wax that makes floors slippery. If you must use wax, use non-skid floor wax.  Do not have throw rugs and other things on the floor that can make you trip. What can I do with my stairs?  Do not leave any items on the stairs.  Make sure that there are handrails on both sides of the stairs and use them. Fix handrails that are broken or loose. Make sure that handrails are as long as the stairways.  Check any carpeting to make sure that it is firmly attached to the stairs. Fix any carpet that is loose or worn.  Avoid having throw rugs at the top or bottom of the stairs. If you do have throw rugs, attach them to the floor with carpet  tape.  Make sure that you have a light switch at the top of the stairs and the bottom of the stairs. If you do not have them, ask someone to add them for you. What else can I do to help prevent falls?  Wear shoes that:  Do not have high heels.  Have rubber bottoms.  Are comfortable and fit you well.  Are closed at the toe. Do not wear sandals.  If you use a stepladder:  Make sure that it is fully opened. Do not climb a closed stepladder.  Make sure that both sides of the stepladder are locked into place.  Ask someone to hold it for you, if possible.  Clearly mark and make sure that you can see:  Any grab bars or handrails.  First and last steps.  Where the edge of each step is.  Use tools that help you move around (mobility aids) if they are needed. These include:  Canes.  Walkers.  Scooters.  Crutches.  Turn on the lights when you go into a dark area. Replace any light bulbs as soon as they burn out.  Set up your furniture so you have a clear path. Avoid moving your furniture around.  If any of your floors are uneven, fix them.  If there are any pets around you, be aware of where they are.  Review your medicines with your doctor. Some medicines can make you feel dizzy. This can increase your chance of falling. Ask your doctor what other things that you can do to help prevent falls. This information is not intended to replace advice given to you by your health care provider. Make sure you discuss any questions you have with your health care provider. Document Released: 08/27/2009 Document Revised: 04/07/2016 Document Reviewed: 12/05/2014 Elsevier Interactive Patient Education  2017 [employer].

## 2021-03-29 NOTE — Progress Notes (Addendum)
Subjective:   [name] is a 75 y.o. male who presents for Medicare Annual/Subsequent preventive examination.  Review of Systems    No ROS.  Medicare Wellness Virtual Visit.  Visual/audio telehealth visit, UTA vital signs.   See social history for additional risk factors.   Cardiac Risk Factors include: advanced age (>2men, >31 women);male gender;hypertension     Objective:    Today's Vitals   03/29/21 1404  Weight: 200 lb (90.7 kg)  Height: 6\' 1"  (1.854 m)   Body mass index is 26.39 kg/m.  Advanced Directives 03/29/2021 07/10/2020 07/08/2020 03/26/2020 03/26/2019 03/23/2018 02/05/2014  Does Patient Have a Medical Advance Directive? No No No No No No Patient does not have advance directive;Patient would like information  Would patient like information on creating a medical advance directive? No - Patient declined No - Patient declined No - Patient declined Yes (MAU/Ambulatory/Procedural Areas - Information given) Yes (MAU/Ambulatory/Procedural Areas - Information given) No - Patient declined Advance directive packet given    Current Medications (verified) Outpatient Encounter Medications as of 03/29/2021  Medication Sig  . apixaban (ELIQUIS) 5 MG TABS tablet TAKE 1 TABLET(5 MG) BY MOUTH TWICE DAILY  . Ascorbic Acid (VITAMIN C) 1000 MG tablet Take 1,000 mg by mouth daily.  . carvedilol (COREG) 6.25 MG tablet Take 1 tablet (6.25 mg total) by mouth 2 (two) times daily with a meal.  . Cholecalciferol (VITAMIN D3) 125 MCG (5000 UT) TABS Take 5,000 Units by mouth daily.   Marland Kitchen omeprazole (PRILOSEC) 40 MG capsule Take 1 capsule (40 mg total) by mouth daily. 30 minutes before breakfast appt more refills  . pravastatin (PRAVACHOL) 40 MG tablet Take 40 mg by mouth at bedtime.   . Zinc 50 MG CAPS Take 50 mg by mouth daily.    No facility-administered encounter medications on file as of 03/29/2021.    Allergies (verified) Lipitor [atorvastatin], Lisinopril, and Simvastatin    History: Past Medical History:  Diagnosis Date  . AICD (automatic cardioverter/defibrillator) present   . Atrophic kidney    left   . Cataract    left eye mild   . Chicken pox   . COVID-19    2020/2021 ? month  . Degenerative disc disease, lumbar    L4/5 noted imaging  . Dyslipidemia   . Encounter for care of pacemaker 05/05/2019  . Gallstones   . GERD (gastroesophageal reflux disease)   . History of esophageal stricture    05/08/09  . History of kidney stones   . Kidney stones   . LVH (left ventricular hypertrophy)    mild on imaging   . Presence of permanent cardiac pacemaker   . Solitary kidney    left kidney is atrophic    Past Surgical History:  Procedure Laterality Date  . FOOT SURGERY     right mortons neuroma   . HOLEP-LASER ENUCLEATION OF THE PROSTATE WITH MORCELLATION N/A 07/10/2020   Procedure: HOLEP-LASER ENUCLEATION OF THE PROSTATE WITH MORCELLATION;  Surgeon: Billey Co, MD;  Location: [facility] ORS;  Service: Urology;  Laterality: N/A;  . PACEMAKER PLACEMENT     2015 Dr. Caryl Comes for AV block  . PERMANENT PACEMAKER INSERTION N/A 02/05/2014   Procedure: PERMANENT PACEMAKER INSERTION;  Surgeon: Deboraha Sprang, MD;  Location: [facility] CATH LAB;  Service: Cardiovascular;  Laterality: N/A;   Family History  Problem Relation Age of Onset  . Diabetes Mother        died of complications   . Cancer Father  bladder   . Stroke Father    Social History   Socioeconomic History  . Marital status: Married    Spouse name: Not on file  . Number of children: 2  . Years of education: Not on file  . Highest education level: Not on file  Occupational History  . Not on file  Tobacco Use  . Smoking status: Former Smoker    Packs/day: 1.00    Years: 20.00    Pack years: 20.00    Types: Cigarettes    Quit date: 10/21/1991    Years since quitting: 29.4  . Smokeless tobacco: Never Used  . Tobacco comment: smoked [id number] <1 ppd no FH lung cancer   Vaping Use  .  Vaping Use: Never used  Substance and Sexual Activity  . Alcohol use: Yes    Comment: Drinks 2-3 ounces of liquor daily  . Drug use: No  . Sexual activity: Yes    Partners: Female    Birth control/protection: None  Other Topics Concern  . Not on file  Social History Narrative   Used to be in the [place]    2 kids (son and daughter)   Married    Born [place] grew up in [place]    Works part time at [employer] smoker 1968 to 1998 < 1ppd no FH lung cancer    Drinks 2-3 small shots of liquor daily, no drugs, never chewed tobacco    Social Determinants of Radio broadcast assistant Strain: Not on file  Food Insecurity: No Food Insecurity  . Worried About Charity fundraiser in the Last Year: Never true  . Ran Out of Food in the Last Year: Never true  Transportation Needs: No Transportation Needs  . Lack of Transportation (Medical): No  . Lack of Transportation (Non-Medical): No  Physical Activity: Sufficiently Active  . Days of Exercise per Week: 5 days  . Minutes of Exercise per Session: 30 min  Stress: No Stress Concern Present  . Feeling of Stress : Not at all  Social Connections: Unknown  . Frequency of Communication with Friends and Family: More than three times a week  . Frequency of Social Gatherings with Friends and Family: More than three times a week  . Attends Religious Services: Not on file  . Active Member of Clubs or Organizations: Not on file  . Attends Archivist Meetings: Not on file  . Marital Status: Not on file    Tobacco Counseling Counseling given: Not Answered Comment: smoked [id number] <1 ppd no FH lung cancer    Clinical Intake:  Pre-visit preparation completed: Yes        Diabetes: No  How often do you need to have someone help you when you read instructions, pamphlets, or other written materials from your doctor or pharmacy?: 1 - Never    Interpreter Needed?: No      Activities of Daily Living In your present state  of health, do you have any difficulty performing the following activities: 03/29/2021 07/08/2020  Hearing? N N  Vision? N N  Difficulty concentrating or making decisions? N N  Walking or climbing stairs? N N  Dressing or bathing? N N  Doing errands, shopping? N N  Preparing Food and eating ? N -  Using the Toilet? N -  In the past six months, have you accidently leaked urine? N -  Comment Followed by Urology -  Do you have problems with loss of bowel  control? N -  Managing your Medications? N -  Managing your Finances? N -  Housekeeping or managing your Housekeeping? N -  Some recent data might be hidden    Patient Care Team: McLean-Scocuzza, Nino Glow, MD as PCP - General (Internal Medicine)  Indicate any recent Medical Services you may have received from other than [facility] providers in the past year (date may be approximate).     Assessment:   This is a routine wellness examination for Pietro.  I connected with Eldin today by telephone and verified that I am speaking with the correct person using two identifiers. Location patient: home Location provider: work Persons participating in the virtual visit: patient, Marine scientist.    I discussed the limitations, risks, security and privacy concerns of performing an evaluation and management service by telephone and the availability of in person appointments. The patient expressed understanding and verbally consented to this telephonic visit.    Interactive audio and video telecommunications were attempted between this provider and patient, however failed, due to patient having technical difficulties OR patient did not have access to video capability.  We continued and completed visit with audio only.  Some vital signs may be absent or patient reported.   Hearing/Vision screen  Hearing Screening   125Hz  250Hz  500Hz  1000Hz  2000Hz  3000Hz  4000Hz  6000Hz  8000Hz   Right ear:           Left ear:           Comments: Patient is able to hear  conversational tones without difficulty.  No issues reported.  Vision Screening Comments: Wears corrective lenses Visual acuity not assessed, virtual visit.  They have seen their ophthalmologist in the last 12 months.     Dietary issues and exercise activities discussed: Current Exercise Habits: Home exercise routine, Intensity: Mild  Healthy diet Good water intake  Goals Addressed   None    Depression Screen PHQ 2/9 Scores 03/29/2021 03/26/2020 03/26/2019 09/28/2018 03/23/2018  PHQ - 2 Score 0 0 0 0 0    Fall Risk Fall Risk  03/29/2021 05/01/2020 03/26/2020 03/26/2019 09/28/2018  Falls in the past year? 0 0 0 0 0  Number falls in past yr: 0 0 - - -  Injury with Fall? 0 0 - - -  Follow up Falls evaluation completed Falls evaluation completed Falls evaluation completed - -    FALL RISK PREVENTION PERTAINING TO THE HOME: Handrails I use when climbing stairs? Yes Home free of loose throw rugs in walkways, pet beds, electrical cords, etc? Yes  Adequate lighting in your home to reduce risk of falls? Yes   ASSISTIVE DEVICES UTILIZED TO PREVENT FALLS: Life alert? No  Use of a cane, walker or w/c? No   TIMED UP AND GO: Was the test performed? No . Virtual visit.   Cognitive Function: Patient is alert and oriented x3.  Denies difficulty focusing, making decisions, memory loss.  Enjoys working, socializing and other brain health stimulating activities.  MMSE/6CIT deferred. Normal by direct communication/observation.    MMSE - Mini Mental State Exam 03/23/2018  Orientation to time 5  Orientation to Place 5  Registration 3  Attention/ Calculation 5  Recall 3  Language- name 2 objects 2  Language- repeat 1  Language- follow 3 step command 3  Language- read & follow direction 1  Write a sentence 1  Copy design 1  Total score 30     6CIT Screen 03/26/2020 03/26/2019  What Year? 0 points 0 points  What month? 0  points 0 points  What time? 0 points 0 points  Count back from 20  - 0 points  Months in reverse - 0 points  Repeat phrase - 0 points  Total Score - 0    Immunizations Immunization History  Administered Date(s) Administered  . Influenza, High Dose Seasonal PF 09/28/2018  . Influenza-Unspecified 08/23/2017  . Pneumococcal Conjugate-13 10/20/2017  . Pneumococcal Polysaccharide-23 11/22/2018   Covid vaccine x3 complete. Agrees to update immunizatio chart next office visit.   TDAP status: Due, Education has been provided regarding the importance of this vaccine. Advised may receive this vaccine at local pharmacy or Health Dept. Aware to provide a copy of the vaccination record if obtained from local pharmacy or Health Dept. Verbalized acceptance and understanding. Deferred.   Health Maintenance Health Maintenance  Topic Date Due  . COVID-19 Vaccine (1) Never done  . TETANUS/TDAP  03/29/2022 (Originally 07/25/1965)  . INFLUENZA VACCINE  06/14/2021  . COLONOSCOPY (Pts 45-55yrs Insurance coverage will need to be confirmed)  01/23/2028  . Hepatitis C Screening  Completed  . PNA vac Low Risk Adult  Completed  . HPV VACCINES  Aged Out   Colorectal cancer screening: Type of screening: Colonoscopy. Completed 01/22/18. Repeat every 10 years  Vision Screening: Recommended annual ophthalmology exams for early detection of glaucoma and other disorders of the eye. Is the patient up to date with their annual eye exam?  Yes   Dental Screening: Recommended annual dental exams for proper oral hygiene.  Community Resource Referral / Chronic Care Management: CRR required this visit?  No   CCM required this visit?  No      Plan:   Keep all routine maintenance appointments.   Next scheduled lab 04/26/21 @ 8:00  Follow up 04/30/21 @ 10:30  I have personally reviewed and noted the following in the patient's chart:   . Medical and social history . Use of alcohol, tobacco or illicit drugs  . Current medications and supplements including opioid prescriptions.  Patient is not currently taking opioid prescriptions. . Functional ability and status . Nutritional status . Physical activity . Advanced directives . List of other physicians . Hospitalizations, surgeries, and ER visits in previous 12 months . Vitals . Screenings to include cognitive, depression, and falls . Referrals and appointments  In addition, I have reviewed and discussed with patient certain preventive protocols, quality metrics, and best practice recommendations. A written personalized care plan for preventive services as well as general preventive health recommendations were provided to patient via mychart.     Varney Biles, LPN   [id number]

## 2021-04-05 DIAGNOSIS — H2513 Age-related nuclear cataract, bilateral: Secondary | ICD-10-CM | POA: Diagnosis not present

## 2021-04-08 DIAGNOSIS — I442 Atrioventricular block, complete: Secondary | ICD-10-CM | POA: Diagnosis not present

## 2021-04-08 DIAGNOSIS — Z45018 Encounter for adjustment and management of other part of cardiac pacemaker: Secondary | ICD-10-CM | POA: Diagnosis not present

## 2021-04-08 DIAGNOSIS — Z950 Presence of cardiac pacemaker: Secondary | ICD-10-CM | POA: Diagnosis not present

## 2021-04-22 ENCOUNTER — Telehealth: Payer: Self-pay | Admitting: *Deleted

## 2021-04-23 ENCOUNTER — Other Ambulatory Visit: Payer: Self-pay | Admitting: Internal Medicine

## 2021-04-23 ENCOUNTER — Telehealth: Payer: Self-pay | Admitting: *Deleted

## 2021-04-23 DIAGNOSIS — I10 Essential (primary) hypertension: Secondary | ICD-10-CM

## 2021-04-23 DIAGNOSIS — N401 Enlarged prostate with lower urinary tract symptoms: Secondary | ICD-10-CM

## 2021-04-23 DIAGNOSIS — R3912 Poor urinary stream: Secondary | ICD-10-CM

## 2021-04-23 DIAGNOSIS — E785 Hyperlipidemia, unspecified: Secondary | ICD-10-CM

## 2021-04-23 DIAGNOSIS — I2510 Atherosclerotic heart disease of native coronary artery without angina pectoris: Secondary | ICD-10-CM

## 2021-04-23 DIAGNOSIS — Z87442 Personal history of urinary calculi: Secondary | ICD-10-CM

## 2021-04-23 DIAGNOSIS — Z1329 Encounter for screening for other suspected endocrine disorder: Secondary | ICD-10-CM

## 2021-04-23 DIAGNOSIS — K760 Fatty (change of) liver, not elsewhere classified: Secondary | ICD-10-CM

## 2021-04-23 DIAGNOSIS — I2584 Coronary atherosclerosis due to calcified coronary lesion: Secondary | ICD-10-CM

## 2021-04-23 NOTE — Telephone Encounter (Signed)
Please place future orders for lab appt.  

## 2021-04-26 ENCOUNTER — Other Ambulatory Visit: Payer: Self-pay

## 2021-04-26 ENCOUNTER — Other Ambulatory Visit (INDEPENDENT_AMBULATORY_CARE_PROVIDER_SITE_OTHER): Payer: Medicare Other

## 2021-04-26 DIAGNOSIS — N401 Enlarged prostate with lower urinary tract symptoms: Secondary | ICD-10-CM

## 2021-04-26 DIAGNOSIS — Z1329 Encounter for screening for other suspected endocrine disorder: Secondary | ICD-10-CM

## 2021-04-26 DIAGNOSIS — I2584 Coronary atherosclerosis due to calcified coronary lesion: Secondary | ICD-10-CM | POA: Diagnosis not present

## 2021-04-26 DIAGNOSIS — K760 Fatty (change of) liver, not elsewhere classified: Secondary | ICD-10-CM | POA: Diagnosis not present

## 2021-04-26 DIAGNOSIS — I2510 Atherosclerotic heart disease of native coronary artery without angina pectoris: Secondary | ICD-10-CM

## 2021-04-26 DIAGNOSIS — I10 Essential (primary) hypertension: Secondary | ICD-10-CM | POA: Diagnosis not present

## 2021-04-26 DIAGNOSIS — Z87442 Personal history of urinary calculi: Secondary | ICD-10-CM

## 2021-04-26 DIAGNOSIS — R3912 Poor urinary stream: Secondary | ICD-10-CM | POA: Diagnosis not present

## 2021-04-26 DIAGNOSIS — E785 Hyperlipidemia, unspecified: Secondary | ICD-10-CM | POA: Diagnosis not present

## 2021-04-26 LAB — CBC WITH DIFFERENTIAL/PLATELET
Basophils Absolute: 0.1 10*3/uL (ref 0.0–0.1)
Basophils Relative: 1 % (ref 0.0–3.0)
Eosinophils Absolute: 0.2 10*3/uL (ref 0.0–0.7)
Eosinophils Relative: 3.3 % (ref 0.0–5.0)
HCT: 45.6 % (ref 39.0–52.0)
Hemoglobin: 14.9 g/dL (ref 13.0–17.0)
Lymphocytes Relative: 27.4 % (ref 12.0–46.0)
Lymphs Abs: 1.4 10*3/uL (ref 0.7–4.0)
MCHC: 32.7 g/dL (ref 30.0–36.0)
MCV: 89.3 fl (ref 78.0–100.0)
Monocytes Absolute: 0.4 10*3/uL (ref 0.1–1.0)
Monocytes Relative: 8 % (ref 3.0–12.0)
Neutro Abs: 3 10*3/uL (ref 1.4–7.7)
Neutrophils Relative %: 60.3 % (ref 43.0–77.0)
Platelets: 166 10*3/uL (ref 150.0–400.0)
RBC: 5.1 Mil/uL (ref 4.22–5.81)
RDW: 14.4 % (ref 11.5–15.5)
WBC: 5 10*3/uL (ref 4.0–10.5)

## 2021-04-26 LAB — LIPID PANEL
Cholesterol: 166 mg/dL (ref 0–200)
HDL: 48.6 mg/dL (ref >39.00)
LDL Cholesterol: 93 mg/dL (ref 0–99)
NonHDL: 117.43
Total CHOL/HDL Ratio: 3
Triglycerides: 122 mg/dL (ref 0.0–149.0)
VLDL: 24.4 mg/dL (ref 0.0–40.0)

## 2021-04-26 LAB — COMPREHENSIVE METABOLIC PANEL
ALT: 13 U/L (ref 0–53)
AST: 15 U/L (ref 0–37)
Albumin: 4.2 g/dL (ref 3.5–5.2)
Alkaline Phosphatase: 63 U/L (ref 39–117)
BUN: 14 mg/dL (ref 6–23)
CO2: 29 mEq/L (ref 19–32)
Calcium: 9.1 mg/dL (ref 8.4–10.5)
Chloride: 105 mEq/L (ref 96–112)
Creatinine, Ser: 1 mg/dL (ref 0.40–1.50)
GFR: 74.54 mL/min (ref >60.00)
Glucose, Bld: 98 mg/dL (ref 70–99)
Potassium: 4.4 mEq/L (ref 3.5–5.1)
Sodium: 141 mEq/L (ref 135–145)
Total Bilirubin: 1 mg/dL (ref 0.2–1.2)
Total Protein: 6.7 g/dL (ref 6.0–8.3)

## 2021-04-26 LAB — TSH: TSH: 1.38 u[IU]/mL (ref 0.35–4.50)

## 2021-04-26 LAB — PSA: PSA: 0.29 ng/mL (ref 0.10–4.00)

## 2021-04-27 LAB — URINALYSIS, ROUTINE W REFLEX MICROSCOPIC
Bilirubin Urine: NEGATIVE
Glucose, UA: NEGATIVE
Hgb urine dipstick: NEGATIVE
Ketones, ur: NEGATIVE
Nitrite: POSITIVE — AB
Protein, ur: NEGATIVE
RBC / HPF: NONE SEEN /HPF (ref 0–2)
Specific Gravity, Urine: 1.019 (ref 1.001–1.035)
WBC, UA: >=60 /HPF — AB (ref 0–5)
Yeast: NONE SEEN /HPF
pH: 6 (ref 5.0–8.0)

## 2021-04-27 LAB — MICROSCOPIC MESSAGE

## 2021-04-28 NOTE — Telephone Encounter (Signed)
Opened in error

## 2021-04-28 NOTE — Progress Notes (Signed)
rione

## 2021-04-28 NOTE — Addendum Note (Signed)
Addended byElpidio Galea T on: 04/28/2021 09:54 AM   Modules accepted: Orders

## 2021-04-30 ENCOUNTER — Ambulatory Visit: Payer: Medicare Other | Admitting: Internal Medicine

## 2021-05-03 ENCOUNTER — Other Ambulatory Visit: Payer: Medicare Other

## 2021-05-03 ENCOUNTER — Other Ambulatory Visit: Payer: Self-pay

## 2021-05-03 DIAGNOSIS — Z87442 Personal history of urinary calculi: Secondary | ICD-10-CM | POA: Diagnosis not present

## 2021-05-03 DIAGNOSIS — R3912 Poor urinary stream: Secondary | ICD-10-CM | POA: Diagnosis not present

## 2021-05-03 DIAGNOSIS — N401 Enlarged prostate with lower urinary tract symptoms: Secondary | ICD-10-CM

## 2021-05-04 LAB — URINE CULTURE
MICRO NUMBER:: 12026965
SPECIMEN QUALITY:: ADEQUATE

## 2021-05-11 ENCOUNTER — Other Ambulatory Visit: Payer: Self-pay

## 2021-05-11 ENCOUNTER — Encounter: Payer: Self-pay | Admitting: Internal Medicine

## 2021-05-11 ENCOUNTER — Ambulatory Visit (INDEPENDENT_AMBULATORY_CARE_PROVIDER_SITE_OTHER): Payer: Medicare Other | Admitting: Internal Medicine

## 2021-05-11 ENCOUNTER — Telehealth: Payer: Self-pay | Admitting: Internal Medicine

## 2021-05-11 VITALS — BP 122/80 | HR 61 | Temp 98.0°F | Ht 72.99 in | Wt 210.0 lb

## 2021-05-11 DIAGNOSIS — E785 Hyperlipidemia, unspecified: Secondary | ICD-10-CM

## 2021-05-11 DIAGNOSIS — Z1389 Encounter for screening for other disorder: Secondary | ICD-10-CM

## 2021-05-11 DIAGNOSIS — Z950 Presence of cardiac pacemaker: Secondary | ICD-10-CM | POA: Diagnosis not present

## 2021-05-11 DIAGNOSIS — Z1329 Encounter for screening for other suspected endocrine disorder: Secondary | ICD-10-CM

## 2021-05-11 DIAGNOSIS — I10 Essential (primary) hypertension: Secondary | ICD-10-CM | POA: Diagnosis not present

## 2021-05-11 DIAGNOSIS — N400 Enlarged prostate without lower urinary tract symptoms: Secondary | ICD-10-CM

## 2021-05-11 DIAGNOSIS — I2510 Atherosclerotic heart disease of native coronary artery without angina pectoris: Secondary | ICD-10-CM

## 2021-05-11 DIAGNOSIS — I442 Atrioventricular block, complete: Secondary | ICD-10-CM

## 2021-05-11 DIAGNOSIS — Z125 Encounter for screening for malignant neoplasm of prostate: Secondary | ICD-10-CM | POA: Diagnosis not present

## 2021-05-11 DIAGNOSIS — D039 Melanoma in situ, unspecified: Secondary | ICD-10-CM | POA: Diagnosis not present

## 2021-05-11 DIAGNOSIS — I2584 Coronary atherosclerosis due to calcified coronary lesion: Secondary | ICD-10-CM | POA: Diagnosis not present

## 2021-05-11 DIAGNOSIS — H259 Unspecified age-related cataract: Secondary | ICD-10-CM

## 2021-05-11 MED ORDER — CARVEDILOL 6.25 MG PO TABS: 6.2500 mg | ORAL_TABLET | Freq: Two times a day (BID) | ORAL | 3 refills | Status: DC

## 2021-05-11 MED ORDER — PRAVASTATIN SODIUM 40 MG PO TABS: 40.0000 mg | ORAL_TABLET | Freq: Every day | ORAL | 3 refills | Status: DC

## 2021-05-11 NOTE — Patient Instructions (Addendum)
Please call Dr. Nehemiah Massed for total body skin exam  [phone] Not available [address]    [place] [address]      Specialties     Dermatology      Copy of covid 19 card please

## 2021-05-11 NOTE — Telephone Encounter (Signed)
PT dropped off their covid vaccination card up front for Dr.TMS

## 2021-05-11 NOTE — Progress Notes (Signed)
Acute Office Visit  Subjective:    Patient ID: [name], male    DOB: Mar 08, 1946, 75 y.o.   MRN: [id number]  Chief Complaint  Patient presents with   Follow-up    Pt states he has no concerns     Fu  1. Av block with pacemaker, htn controlled, cad/hld (diet is not limited and he "eats what he wants" on coreg 6.25 mg bid, pravachol 40 mg qhs and eliquis 5 mg bid per cardiology doing well  2. H/o MIS no f/u with dermatology rec pt call to schedule f/u 3. S/p procedure with Dr. Diamantina Providence he is urinating better since HOELP sx's improved  Will f/u 03/2022   Patient is in today for above  Past Medical History:  Diagnosis Date   AICD (automatic cardioverter/defibrillator) present    Atrophic kidney    left    Cataract    left eye mild    Chicken pox    COVID-19    09/2019/2021 ? month   Degenerative disc disease, lumbar    L4/5 noted imaging   Dyslipidemia    Encounter for care of pacemaker 05/05/2019   Gallstones    GERD (gastroesophageal reflux disease)    History of esophageal stricture    05/08/09   History of kidney stones    Kidney stones    LVH (left ventricular hypertrophy)    mild on imaging    Presence of permanent cardiac pacemaker    Solitary kidney    left kidney is atrophic     Past Surgical History:  Procedure Laterality Date   FOOT SURGERY     right mortons neuroma    HOLEP-LASER ENUCLEATION OF THE PROSTATE WITH MORCELLATION N/A 07/10/2020   Procedure: HOLEP-LASER ENUCLEATION OF THE PROSTATE WITH MORCELLATION;  Surgeon: Billey Co, MD;  Location: [facility] ORS;  Service: Urology;  Laterality: N/A;   PACEMAKER PLACEMENT     2015 Dr. Caryl Comes for AV block   PERMANENT PACEMAKER INSERTION N/A 02/05/2014   Procedure: PERMANENT PACEMAKER INSERTION;  Surgeon: Deboraha Sprang, MD;  Location: [facility] CATH LAB;  Service: Cardiovascular;  Laterality: N/A;    Family History  Problem Relation Age of Onset   Diabetes Mother        died of complications    Cancer  Father        bladder    Stroke Father     Social History   Socioeconomic History   Marital status: Married    Spouse name: Not on file   Number of children: 2   Years of education: Not on file   Highest education level: Not on file  Occupational History   Not on file  Tobacco Use   Smoking status: Former    Packs/day: 1.00    Years: 20.00    Pack years: 20.00    Types: Cigarettes    Quit date: 10/21/1991    Years since quitting: 29.5   Smokeless tobacco: Never   Tobacco comments:    smoked [id number] <1 ppd no FH lung cancer   Vaping Use   Vaping Use: Never used  Substance and Sexual Activity   Alcohol use: Yes    Comment: Drinks 2-3 ounces of liquor daily   Drug use: No   Sexual activity: Yes    Partners: Female    Birth control/protection: None  Other Topics Concern   Not on file  Social History Narrative   Used to be in the [employer]  2 kids (son and daughter)   Married    Born [place] grew up in [place]    Works part time at [employer] smoker 1968 to 1998 < 1ppd no FH lung cancer    Drinks 2-3 small shots of liquor daily, no drugs, never chewed tobacco    Social Determinants of Radio broadcast assistant Strain: Not on file  Food Insecurity: No Food Insecurity   Worried About Charity fundraiser in the Last Year: Never true   Arboriculturist in the Last Year: Never true  Transportation Needs: No Transportation Needs   Lack of Transportation (Medical): No   Lack of Transportation (Non-Medical): No  Physical Activity: Sufficiently Active   Days of Exercise per Week: 5 days   Minutes of Exercise per Session: 30 min  Stress: No Stress Concern Present   Feeling of Stress : Not at all  Social Connections: Unknown   Frequency of Communication with Friends and Family: More than three times a week   Frequency of Social Gatherings with Friends and Family: More than three times a week   Attends Religious Services: Not on Electrical engineer or  Organizations: Not on file   Attends Archivist Meetings: Not on file   Marital Status: Not on file  Intimate Partner Violence: Not At Risk   Fear of Current or Ex-Partner: No   Emotionally Abused: No   Physically Abused: No   Sexually Abused: No    Outpatient Medications Prior to Visit  Medication Sig Dispense Refill   apixaban (ELIQUIS) 5 MG TABS tablet TAKE 1 TABLET(5 MG) BY MOUTH TWICE DAILY 180 tablet 0   Ascorbic Acid (VITAMIN C) 1000 MG tablet Take 1,000 mg by mouth daily.     Cholecalciferol (VITAMIN D3) 125 MCG (5000 UT) TABS Take 5,000 Units by mouth daily.      omeprazole (PRILOSEC) 40 MG capsule Take 1 capsule (40 mg total) by mouth daily. 30 minutes before breakfast appt more refills 90 capsule 3   Zinc 50 MG CAPS Take 50 mg by mouth daily.      carvedilol (COREG) 6.25 MG tablet Take 1 tablet (6.25 mg total) by mouth 2 (two) times daily with a meal. 180 tablet 3   pravastatin (PRAVACHOL) 40 MG tablet Take 40 mg by mouth at bedtime.      No facility-administered medications prior to visit.    Allergies  Allergen Reactions   Lipitor [Atorvastatin] Other (See Comments)    myalgias   Lisinopril Cough        Simvastatin Other (See Comments)    myalgias    Review of Systems  Constitutional:  Negative for fatigue.  HENT:  Negative for congestion.   Eyes:        +cataract right eye  Respiratory:  Negative for shortness of breath.   Cardiovascular:  Negative for chest pain.  Gastrointestinal:  Negative for abdominal pain and nausea.  Genitourinary:  Negative for difficulty urinating.  Musculoskeletal:  Negative for gait problem.  Skin:  Negative for rash.  Neurological:  Negative for dizziness.  Psychiatric/Behavioral:  Negative for dysphoric mood. The patient is not nervous/anxious.       Objective:    Physical Exam Vitals and nursing note reviewed.  Constitutional:      Appearance: Normal appearance. He is well-developed and well-groomed.  HENT:      Head: Normocephalic and atraumatic.  Eyes:  Conjunctiva/sclera: Conjunctivae normal.     Pupils: Pupils are equal, round, and reactive to light.  Cardiovascular:     Rate and Rhythm: Normal rate and regular rhythm.     Heart sounds: Normal heart sounds. No murmur heard. Pulmonary:     Effort: Pulmonary effort is normal.     Breath sounds: Normal breath sounds.  Abdominal:     General: Abdomen is flat. Bowel sounds are normal.  Skin:    General: Skin is warm and moist.  Neurological:     General: No focal deficit present.     Mental Status: He is alert and oriented to person, place, and time. Mental status is at baseline.     Gait: Gait normal.  Psychiatric:        Attention and Perception: Attention and perception normal.        Mood and Affect: Mood and affect normal.        Speech: Speech normal.        Behavior: Behavior normal. Behavior is cooperative.        Thought Content: Thought content normal.        Cognition and Memory: Cognition and memory normal.        Judgment: Judgment normal.   BP 122/80   Pulse 61   Temp 98 F (36.7 C)   Ht 6' 0.99" (1.854 m)   Wt 210 lb (95.3 kg)   SpO2 96%   BMI 27.71 kg/m  Wt Readings from Last 3 Encounters:  05/11/21 210 lb (95.3 kg)  03/29/21 200 lb (90.7 kg)  03/25/21 200 lb (90.7 kg)    Health Maintenance Due  Topic Date Due   COVID-19 Vaccine (1) Never done   Zoster Vaccines- Shingrix (1 of 2) Never done    There are no preventive care reminders to display for this patient.   Lab Results  Component Value Date   TSH 1.38 04/26/2021   Lab Results  Component Value Date   WBC 5.0 04/26/2021   HGB 14.9 04/26/2021   HCT 45.6 04/26/2021   MCV 89.3 04/26/2021   PLT 166.0 04/26/2021   Lab Results  Component Value Date   NA 141 04/26/2021   K 4.4 04/26/2021   CO2 29 04/26/2021   GLUCOSE 98 04/26/2021   BUN 14 04/26/2021   CREATININE 1.00 04/26/2021   BILITOT 1.0 04/26/2021   ALKPHOS 63 04/26/2021   AST  15 04/26/2021   ALT 13 04/26/2021   PROT 6.7 04/26/2021   ALBUMIN 4.2 04/26/2021   CALCIUM 9.1 04/26/2021   GFR 74.54 04/26/2021   Lab Results  Component Value Date   CHOL 166 04/26/2021   Lab Results  Component Value Date   HDL 48.60 04/26/2021   Lab Results  Component Value Date   LDLCALC 93 04/26/2021   Lab Results  Component Value Date   TRIG 122.0 04/26/2021   Lab Results  Component Value Date   CHOLHDL 3 04/26/2021   Lab Results  Component Value Date   HGBA1C 5.6 05/11/2018       Assessment & Plan:   Problem List Items Addressed This Visit       Unprioritized   Atrioventricular block, complete ([place]) - Primary   Relevant Medications   pravastatin (PRAVACHOL) 40 MG tablet   carvedilol (COREG) 6.25 MG tablet   HTN (hypertension)   Relevant Medications   pravastatin (PRAVACHOL) 40 MG tablet   carvedilol (COREG) 6.25 MG tablet   Coronary artery calcification  Relevant Medications   pravastatin (PRAVACHOL) 40 MG tablet   carvedilol (COREG) 6.25 MG tablet   BPH (benign prostatic hyperplasia)   Relevant Orders   PSA   HLD (hyperlipidemia)   Relevant Medications   pravastatin (PRAVACHOL) 40 MG tablet   carvedilol (COREG) 6.25 MG tablet   Other Relevant Orders   Lipid panel   Malignant melanoma in situ [facility])   Pacemaker St. Jude PM  2240 Dual chamber pacemaker implant  02/05/2014   Coronary artery disease involving native coronary artery of native heart without angina pectoris   Relevant Medications   pravastatin (PRAVACHOL) 40 MG tablet   carvedilol (COREG) 6.25 MG tablet   Other Relevant Orders   Lipid panel   Other Visit Diagnoses     Essential hypertension       Relevant Medications   pravastatin (PRAVACHOL) 40 MG tablet   carvedilol (COREG) 6.25 MG tablet   Other Relevant Orders   Comprehensive metabolic panel   Lipid panel   CBC with Differential/Platelet   Thyroid disorder screening       Relevant Orders   TSH   Screening for blood  or protein in urine       Relevant Orders   Urinalysis, Routine w reflex microscopic   Screening for prostate cancer       Relevant Orders   PSA        Meds ordered this encounter  Medications   pravastatin (PRAVACHOL) 40 MG tablet    Sig: Take 1 tablet (40 mg total) by mouth at bedtime.    Dispense:  90 tablet    Refill:  3   carvedilol (COREG) 6.25 MG tablet    Sig: Take 1 tablet (6.25 mg total) by mouth 2 (two) times daily with a meal.    Dispense:  180 tablet    Refill:  3    D/c Metoprolol 25 mg bid Generic ok, d/c zetia  A/P  1. Av block with pacemaker in 2015, htn controlled, cad/hld (diet is not limited and he "eats what he wants" on coreg 6.25 mg bid, pravachol 40 mg qhs and eliquis 5 mg bid per cardiology doing well  2. H/o MIS no f/u with dermatology rec pt call to schedule f/u 3. S/p procedure with Dr. Diamantina Providence he is urinating better since HOELP sx's improved  Will f/u 03/2022  4.cataract right eye f/u sch Dr. Merla Riches  HM Flu shot and prevnar utd pna 23 utd rec pt had Tdap (pt declined prev) and shingrix given Rx prev but disc consider get at [facility] or pharmacy .  Declines MMR testing Pfizer x 3 utd ? Date will bring card back in   Consider hep C testing in future will disc with pt    PSA nl declines DRE rechecked 04/2021 normal est urology Dr. Diamantina Providence    Dr. Watt Climes screening colonoscopy [facility]  -diverticulosis repeat in 10 years 01/22/18 Pt declines further    -pt saw Dr. Nehemiah Massed 05/01/18 MM in situ left medial chest infraclavicular no lymphadenopathy rec f/u q3 months for 1 year then q4 months for a year and q6 months x 2 years  -derm f/u upcoming 05/2019 -as of 05/01/20 and 05/11/21 rec pt call above for appt f/u derm   Considered CT chest repeat h/o smoking and CT chest in the past mild changes COPD and chronic bronchitis (of note calculated risk and low risk) also consider repeat CT ab/pelvis with and w/o contrast or MRI (reasons CT 2015 c/w calcified  granuloma  w/in liver and nonspecific foci w/in liver 7-10 mm and rec f/u also noted gallstones, kidney stones, left atrophic kidney) -pt declined both of these on 03/23/18     Reviewed cardiology notes 04/02/18 Dr. Einar Gip f/u heart block dual chamber pacemaker 02/05/14 rec add zetia f/u in 1 year appt sch 4 or 03/2022  Dr. Diamantina Providence f/u 03/2021 s/p HOLEP f/u in 1 year  Eye MD 4 or 03/2021 cataracts right side pending appt ophthalmology Dr. Merla Riches  Nino Glow McLean-Scocuzza, MD Internal medicine

## 2021-05-12 NOTE — Telephone Encounter (Signed)
Immunizations updated.

## 2021-06-18 DIAGNOSIS — Z23 Encounter for immunization: Secondary | ICD-10-CM | POA: Diagnosis not present

## 2021-06-24 ENCOUNTER — Other Ambulatory Visit: Payer: Self-pay | Admitting: Cardiology

## 2021-06-24 DIAGNOSIS — I480 Paroxysmal atrial fibrillation: Secondary | ICD-10-CM

## 2021-06-25 ENCOUNTER — Other Ambulatory Visit: Payer: Self-pay | Admitting: Internal Medicine

## 2021-06-25 DIAGNOSIS — I2510 Atherosclerotic heart disease of native coronary artery without angina pectoris: Secondary | ICD-10-CM

## 2021-06-25 DIAGNOSIS — I10 Essential (primary) hypertension: Secondary | ICD-10-CM

## 2021-07-08 DIAGNOSIS — H2513 Age-related nuclear cataract, bilateral: Secondary | ICD-10-CM | POA: Diagnosis not present

## 2021-07-09 DIAGNOSIS — I442 Atrioventricular block, complete: Secondary | ICD-10-CM | POA: Diagnosis not present

## 2021-07-09 DIAGNOSIS — Z950 Presence of cardiac pacemaker: Secondary | ICD-10-CM | POA: Diagnosis not present

## 2021-07-09 DIAGNOSIS — Z45018 Encounter for adjustment and management of other part of cardiac pacemaker: Secondary | ICD-10-CM | POA: Diagnosis not present

## 2021-07-23 ENCOUNTER — Encounter: Payer: Self-pay | Admitting: Cardiology

## 2021-07-23 DIAGNOSIS — I480 Paroxysmal atrial fibrillation: Secondary | ICD-10-CM | POA: Insufficient documentation

## 2021-07-26 ENCOUNTER — Telehealth: Payer: Self-pay | Admitting: Cardiology

## 2021-07-27 NOTE — Telephone Encounter (Signed)
Appt made with Dr. Nadyne Coombes for 08/04/2021

## 2021-07-29 DIAGNOSIS — H2511 Age-related nuclear cataract, right eye: Secondary | ICD-10-CM | POA: Diagnosis not present

## 2021-08-03 ENCOUNTER — Encounter: Payer: Self-pay | Admitting: Ophthalmology

## 2021-08-04 ENCOUNTER — Other Ambulatory Visit: Payer: Self-pay

## 2021-08-04 ENCOUNTER — Ambulatory Visit: Payer: Medicare Other | Admitting: Cardiology

## 2021-08-04 DIAGNOSIS — I2510 Atherosclerotic heart disease of native coronary artery without angina pectoris: Secondary | ICD-10-CM

## 2021-08-04 DIAGNOSIS — I442 Atrioventricular block, complete: Secondary | ICD-10-CM | POA: Diagnosis not present

## 2021-08-04 DIAGNOSIS — I10 Essential (primary) hypertension: Secondary | ICD-10-CM | POA: Diagnosis not present

## 2021-08-04 DIAGNOSIS — I480 Paroxysmal atrial fibrillation: Secondary | ICD-10-CM | POA: Diagnosis not present

## 2021-08-04 DIAGNOSIS — Z950 Presence of cardiac pacemaker: Secondary | ICD-10-CM

## 2021-08-04 DIAGNOSIS — Z45018 Encounter for adjustment and management of other part of cardiac pacemaker: Secondary | ICD-10-CM | POA: Diagnosis not present

## 2021-08-04 MED ORDER — CARVEDILOL 12.5 MG PO TABS: 12.5000 mg | ORAL_TABLET | Freq: Two times a day (BID) | ORAL | 3 refills | Status: DC

## 2021-08-04 NOTE — Progress Notes (Signed)
Chief Complaint  Patient presents with   Pacemaker Check    Scheduled  In office pacemaker check 08/04/21  Single (S)/Dual (D)/BV: D. Presenting ASVP. Pacemaker dependant:  Yes. Underlying CHB. AP 32%, VP 100%  AMS Episodes 32.  AT/AF burden 56%  Since Aug 26th 2022.  Patient was in persistent atrial fibrillation since July 2022.  Has now converted back to sinus on 07/30/2021.  Has had very brief AT episodes for a few seconds.   HVR 0.  Longevity 2.2 Years. Magnet rate: >85%. Lead measurements: Stable. Histogram: Low (L)/normal (N)/high (H)  Normal. Patient activity Good.   Observations: Normal pacemaker function. Changes: Turned sensor on for auto mode switch to improve V response to activity.    ICD-10-CM   1. Encounter for care of pacemaker  Z45.018     2. Pacemaker St. Jude PM  2240 Dual chamber pacemaker implant  02/05/2014  Z95.0     3. Atrioventricular block, complete (HCC)  I44.2     4. Essential hypertension  I10 carvedilol (COREG) 12.5 MG tablet    5. Coronary artery disease involving native coronary artery of native heart without angina pectoris  I25.10     6. Paroxysmal atrial fibrillation (HCC)  I48.0 carvedilol (COREG) 12.5 MG tablet     Meds ordered this encounter  Medications   carvedilol (COREG) 12.5 MG tablet    Sig: Take 1 tablet (12.5 mg total) by mouth 2 (two) times daily with a meal.    Dispense:  180 tablet    Refill:  3    I have increased the dose of carvedilol from 6.25 mg to 12.5 mg twice daily in the hopes of preventing recurrence of atrial fibrillation.   Adrian Prows, MD, [facility] 08/04/2021, 3:31 PM Office: [phone] Fax: [phone] Pager: [phone]

## 2021-08-05 ENCOUNTER — Encounter: Payer: Self-pay | Admitting: Cardiology

## 2021-08-05 NOTE — Discharge Instructions (Signed)

## 2021-08-08 ENCOUNTER — Emergency Department
Admission: EM | Admit: 2021-08-08 | Discharge: 2021-08-08 | Disposition: A | Discharge status: Home / Self Care | Payer: Medicare Other | Attending: Emergency Medicine | Admitting: Emergency Medicine

## 2021-08-08 ENCOUNTER — Other Ambulatory Visit: Payer: Self-pay

## 2021-08-08 ENCOUNTER — Emergency Department: Payer: Medicare Other

## 2021-08-08 DIAGNOSIS — Z859 Personal history of malignant neoplasm, unspecified: Secondary | ICD-10-CM | POA: Diagnosis not present

## 2021-08-08 DIAGNOSIS — W01198A Fall on same level from slipping, tripping and stumbling with subsequent striking against other object, initial encounter: Secondary | ICD-10-CM | POA: Diagnosis not present

## 2021-08-08 DIAGNOSIS — Z8616 Personal history of COVID-19: Secondary | ICD-10-CM | POA: Insufficient documentation

## 2021-08-08 DIAGNOSIS — M25512 Pain in left shoulder: Secondary | ICD-10-CM | POA: Insufficient documentation

## 2021-08-08 DIAGNOSIS — Z950 Presence of cardiac pacemaker: Secondary | ICD-10-CM | POA: Diagnosis not present

## 2021-08-08 DIAGNOSIS — S0191XA Laceration without foreign body of unspecified part of head, initial encounter: Secondary | ICD-10-CM | POA: Diagnosis not present

## 2021-08-08 DIAGNOSIS — I2510 Atherosclerotic heart disease of native coronary artery without angina pectoris: Secondary | ICD-10-CM | POA: Insufficient documentation

## 2021-08-08 DIAGNOSIS — I10 Essential (primary) hypertension: Secondary | ICD-10-CM | POA: Insufficient documentation

## 2021-08-08 DIAGNOSIS — Z79899 Other long term (current) drug therapy: Secondary | ICD-10-CM | POA: Diagnosis not present

## 2021-08-08 DIAGNOSIS — S01112A Laceration without foreign body of left eyelid and periocular area, initial encounter: Secondary | ICD-10-CM | POA: Diagnosis not present

## 2021-08-08 DIAGNOSIS — Z87891 Personal history of nicotine dependence: Secondary | ICD-10-CM | POA: Diagnosis not present

## 2021-08-08 DIAGNOSIS — W010XXA Fall on same level from slipping, tripping and stumbling without subsequent striking against object, initial encounter: Secondary | ICD-10-CM

## 2021-08-08 DIAGNOSIS — S0181XA Laceration without foreign body of other part of head, initial encounter: Secondary | ICD-10-CM

## 2021-08-08 MED ORDER — LIDOCAINE-EPINEPHRINE-TETRACAINE (LET) TOPICAL GEL
3.0000 mL | Freq: Once | TOPICAL | Status: AC
  Administered 2021-08-08: 3 mL via TOPICAL
  Filled 2021-08-08: qty 3

## 2021-08-08 NOTE — ED Provider Notes (Signed)
[facility] Emergency Department Provider Note ____________________________________________  Time seen: 1200  I have reviewed the triage vital signs and the nursing notes.  HISTORY  Chief Complaint  Fall   HPI [name] is a 76 y.o. male presents to the ER today with complaint of a laceration to his left eyebrow.  He reports he slipped this morning, landed on his left shoulder and hit the left side of his head on the floor.  He denies headache, dizziness or visual changes.  He has been unable to control the bleeding as he is on Eliquis.  He reports the left shoulder pain is a little sore and achy but has improved.  He denies numbness, tingling or weakness of his left upper extremity.  He has not taken any medications for this.  Past Medical History:  Diagnosis Date   Atrophic kidney    left    Cataract    left eye mild    Chicken pox    COVID-19    09/2019/2021 ? month   Degenerative disc disease, lumbar    L4/5 noted imaging   Dyslipidemia    Encounter for care of pacemaker 05/05/2019   Gallstones    GERD (gastroesophageal reflux disease)    History of esophageal stricture    05/08/09   History of kidney stones    Kidney stones    LVH (left ventricular hypertrophy)    mild on imaging    Presence of permanent cardiac pacemaker    Solitary kidney    left kidney is atrophic     Patient Active Problem List   Diagnosis Date Noted   Paroxysmal atrial fibrillation ([place]) 07/23/2021   Age-related cataract of right eye 05/11/2021   Hydronephrosis, left 05/18/2020   Fatty liver 05/15/2020   Granulomatous disease ([place]) 05/15/2020   Gallstones 05/15/2020   Bursitis of right hip 05/01/2020   Coronary artery disease involving native coronary artery of native heart without angina pectoris 05/01/2020   Encounter for care of pacemaker 05/05/2019   Pacemaker St. Jude PM  2240 Dual chamber pacemaker implant  02/05/2014 04/01/2019   History of kidney stones  03/26/2019   Malignant melanoma in situ ([place]) 05/01/2018   HLD (hyperlipidemia) 03/23/2018   Multiple nevi 03/23/2018   Overactive bladder 11/16/2017   HTN (hypertension) 10/20/2017   BPH (benign prostatic hyperplasia) 10/20/2017   Impingement syndrome of right shoulder 03/08/2017   Coronary artery calcification 02/07/2014   Solitary kidney 02/05/2014   GERD (gastroesophageal reflux disease) 02/05/2014   Dyslipidemia 02/05/2014   Atrioventricular block, complete ([place]) 02/05/2014    Past Surgical History:  Procedure Laterality Date   FOOT SURGERY     right mortons neuroma    HOLEP-LASER ENUCLEATION OF THE PROSTATE WITH MORCELLATION N/A 07/10/2020   Procedure: HOLEP-LASER ENUCLEATION OF THE PROSTATE WITH MORCELLATION;  Surgeon: Billey Co, MD;  Location: [facility] ORS;  Service: Urology;  Laterality: N/A;   PACEMAKER PLACEMENT     2015 Dr. Caryl Comes for AV block   PERMANENT PACEMAKER INSERTION N/A 02/05/2014   Procedure: PERMANENT PACEMAKER INSERTION;  Surgeon: Deboraha Sprang, MD;  Location: [facility] CATH LAB;  Service: Cardiovascular;  Laterality: N/A;    Prior to Admission medications   Medication Sig Start Date End Date Taking? Authorizing Provider  Ascorbic Acid (VITAMIN C) 1000 MG tablet Take 1,000 mg by mouth daily.    [provider]  carvedilol (COREG) 12.5 MG tablet Take 1 tablet (12.5 mg total) by mouth 2 (two) times  daily with a meal. 08/04/21   Adrian Prows, MD  Cholecalciferol (VITAMIN D3) 125 MCG (5000 UT) TABS Take 5,000 Units by mouth daily.     [provider]  ELIQUIS 5 MG TABS tablet TAKE 1 TABLET BY MOUTH TWICE A DAY 06/25/21   Adrian Prows, MD  Multiple Vitamins-Minerals (PRESERVISION AREDS 2 PO) Take by mouth in the morning and at bedtime.    [provider]  omeprazole (PRILOSEC) 40 MG capsule Take 1 capsule (40 mg total) by mouth daily. 30 minutes before breakfast appt more refills 11/02/20   McLean-Scocuzza, Nino Glow, MD  pravastatin (PRAVACHOL) 40  MG tablet Take 1 tablet (40 mg total) by mouth at bedtime. 05/11/21   McLean-Scocuzza, Nino Glow, MD  Zinc 50 MG CAPS Take 50 mg by mouth daily.     [provider]    Allergies Lipitor [atorvastatin], Lisinopril, and Simvastatin  Family History  Problem Relation Age of Onset   Diabetes Mother        died of complications    Cancer Father        bladder    Stroke Father     Social History Social History   Tobacco Use   Smoking status: Former    Packs/day: 1.00    Years: 20.00    Pack years: 20.00    Types: Cigarettes    Quit date: 10/21/1991    Years since quitting: 29.8   Smokeless tobacco: Never   Tobacco comments:    smoked [id number] <1 ppd no FH lung cancer   Vaping Use   Vaping Use: Never used  Substance Use Topics   Alcohol use: Yes    Alcohol/week: 7.0 standard drinks    Types: 7 Standard drinks or equivalent per week    Comment: Drinks 2-3 ounces of liquor daily   Drug use: No    Review of Systems  Constitutional: Negative for fever, chills or body aches. Eyes: Negative for visual changes. Cardiovascular: Negative for chest pain or chest tightness. Respiratory: Negative for cough or shortness of breath. Musculoskeletal: Positive for left shoulder pain.  Negative for neck or back pain. Skin: Positive for laceration to left eyebrow. Neurological: Negative for headaches,, dizziness, focal weakness, tingling or numbness. ____________________________________________  PHYSICAL EXAM:  VITAL SIGNS: ED Triage Vitals [08/08/21 1028]  Enc Vitals Group     BP (!) 137/108     Pulse Rate 65     Resp 18     Temp 98.1 F (36.7 C)     Temp Source Oral     SpO2 96 %     Weight 200 lb (90.7 kg)     Height 6\' 1"  (1.854 m)     Head Circumference      Peak Flow      Pain Score 1     Pain Loc      Pain Edu?      Excl. in [place]?     Constitutional: Alert and oriented. Well appearing and in no distress. Head: Normocephalic. Eyes: Normal extraocular  movements Cardiovascular: Normal rate. Respiratory: Normal respiratory effort. No wheezes/rales/rhonchi noted. Musculoskeletal: Normal internal and external rotation of left shoulder. Neurologic:  Normal speech and language. No gross focal neurologic deficits are appreciated. Skin: 1.5 cm horizontal laceration noted to the left outer eyebrow. ____________________________________________  PROCEDURES  .Marland KitchenLaceration Repair  Date/Time: 08/08/2021 12:42 PM Performed by: Jearld Fenton, NP Authorized by: Jearld Fenton, NP   Consent:    Consent obtained:  Verbal  Consent given by:  Patient   Risks discussed:  Poor cosmetic result Universal protocol:    Patient identity confirmed:  Verbally with patient and arm band Anesthesia:    Anesthesia method:  Topical application Laceration details:    Location:  Face   Length (cm):  1.5   Depth (mm):  2 Pre-procedure details:    Preparation:  Patient was prepped and draped in usual sterile fashion Exploration:    Limited defect created (wound extended): no     Hemostasis achieved with:  Epinephrine   Imaging outcome: foreign body not noted     Wound exploration: entire depth of wound visualized     Contaminated: no   Treatment:    Area cleansed with:  Saline   Amount of cleaning:  Standard   Irrigation solution:  Sterile saline   Debridement:  None   Undermining:  None   Scar revision: no   Skin repair:    Repair method:  Tissue adhesive Approximation:    Approximation:  Close Repair type:    Repair type:  Simple Post-procedure details:    Dressing:  Adhesive bandage   Procedure completion:  Tolerated well, no immediate complications  ____________________________________________  INITIAL IMPRESSION / ASSESSMENT AND PLAN / ED COURSE  Laceration of Left Eyebrow, Left Shoulder Pain s/p Fall:  He declines CT head at this time He does not feel there is any indication for xray of his shoulder- I agree Laceration repaired with  Dermabond  Return precautions discussed ____________________________________________  FINAL CLINICAL IMPRESSION(S) / ED DIAGNOSES  Final diagnoses:  Facial laceration, initial encounter  Fall from slipping on slippery surface, initial encounter      Jearld Fenton, NP 08/08/21 1244    Naaman Plummer, MD 08/08/21 1410

## 2021-08-08 NOTE — ED Notes (Addendum)
See triage notes. Pt is still refusing the CT scan.

## 2021-08-08 NOTE — Discharge Instructions (Addendum)
You were seen today for a laceration of your left eyebrow.  This was closed with tissue adhesive.  Please do not peel this off, let come off on its own.

## 2021-08-08 NOTE — ED Notes (Signed)
Refused CT.

## 2021-08-08 NOTE — ED Triage Notes (Signed)
Pt comes pov from home with mechanical fall from water on the floor. Hit left shoulder and head. Is on eliquis. PERRLA, no LOC, Aox4.

## 2021-08-10 ENCOUNTER — Encounter
Admission: RE | Disposition: A | Discharge status: Home / Self Care | Payer: Self-pay | Source: Home / Self Care | Attending: Ophthalmology

## 2021-08-10 ENCOUNTER — Ambulatory Visit
Admission: RE | Admit: 2021-08-10 | Discharge: 2021-08-10 | Disposition: A | Discharge status: Home / Self Care | Payer: Medicare Other | Attending: Ophthalmology | Admitting: Ophthalmology

## 2021-08-10 ENCOUNTER — Encounter: Payer: Self-pay | Admitting: Ophthalmology

## 2021-08-10 ENCOUNTER — Ambulatory Visit: Payer: Medicare Other | Admitting: Anesthesiology

## 2021-08-10 ENCOUNTER — Other Ambulatory Visit: Payer: Self-pay

## 2021-08-10 DIAGNOSIS — Z7901 Long term (current) use of anticoagulants: Secondary | ICD-10-CM | POA: Insufficient documentation

## 2021-08-10 DIAGNOSIS — H2511 Age-related nuclear cataract, right eye: Secondary | ICD-10-CM | POA: Diagnosis not present

## 2021-08-10 DIAGNOSIS — Z87891 Personal history of nicotine dependence: Secondary | ICD-10-CM | POA: Diagnosis not present

## 2021-08-10 DIAGNOSIS — H25811 Combined forms of age-related cataract, right eye: Secondary | ICD-10-CM | POA: Diagnosis not present

## 2021-08-10 DIAGNOSIS — Z888 Allergy status to other drugs, medicaments and biological substances status: Secondary | ICD-10-CM | POA: Diagnosis not present

## 2021-08-10 DIAGNOSIS — Z8616 Personal history of COVID-19: Secondary | ICD-10-CM | POA: Insufficient documentation

## 2021-08-10 DIAGNOSIS — Z79899 Other long term (current) drug therapy: Secondary | ICD-10-CM | POA: Diagnosis not present

## 2021-08-10 DIAGNOSIS — Z950 Presence of cardiac pacemaker: Secondary | ICD-10-CM | POA: Insufficient documentation

## 2021-08-10 HISTORY — PX: CATARACT EXTRACTION W/PHACO: SHX586

## 2021-08-10 SURGERY — PHACOEMULSIFICATION, CATARACT, WITH IOL INSERTION
Anesthesia: Monitor Anesthesia Care | Site: Eye | Laterality: Right

## 2021-08-10 MED ORDER — SIGHTPATH DOSE#1 NA CHONDROIT SULF-NA HYALURON 40-17 MG/ML IO SOLN
INTRAOCULAR | Status: DC | PRN
  Administered 2021-08-10: 1 mL via INTRAOCULAR

## 2021-08-10 MED ORDER — BRIMONIDINE TARTRATE-TIMOLOL 0.2-0.5 % OP SOLN
OPHTHALMIC | Status: DC | PRN
  Administered 2021-08-10: 1 [drp] via OPHTHALMIC

## 2021-08-10 MED ORDER — SIGHTPATH DOSE#1 BSS IO SOLN
INTRAOCULAR | Status: DC | PRN
  Administered 2021-08-10: 58 mL via OPHTHALMIC

## 2021-08-10 MED ORDER — ACETAMINOPHEN 325 MG PO TABS: 325.0000 mg | ORAL_TABLET | ORAL | Status: DC | PRN

## 2021-08-10 MED ORDER — ACETAMINOPHEN 160 MG/5ML PO SOLN: 325.0000 mg | ORAL | Status: DC | PRN

## 2021-08-10 MED ORDER — MOXIFLOXACIN HCL 0.5 % OP SOLN
OPHTHALMIC | Status: DC | PRN
  Administered 2021-08-10: 0.2 mL via OPHTHALMIC

## 2021-08-10 MED ORDER — FENTANYL CITRATE (PF) 100 MCG/2ML IJ SOLN
INTRAMUSCULAR | Status: DC | PRN
  Administered 2021-08-10: 50 ug via INTRAVENOUS

## 2021-08-10 MED ORDER — SIGHTPATH DOSE#1 BSS IO SOLN
INTRAOCULAR | Status: DC | PRN
  Administered 2021-08-10: 15 mL

## 2021-08-10 MED ORDER — MIDAZOLAM HCL 2 MG/2ML IJ SOLN
INTRAMUSCULAR | Status: DC | PRN
  Administered 2021-08-10: 1 mg via INTRAVENOUS

## 2021-08-10 MED ORDER — LACTATED RINGERS IV SOLN: INTRAVENOUS | Status: DC

## 2021-08-10 MED ORDER — TETRACAINE HCL 0.5 % OP SOLN
1.0000 [drp] | OPHTHALMIC | Status: DC | PRN
  Administered 2021-08-10 (×3): 1 [drp] via OPHTHALMIC

## 2021-08-10 MED ORDER — ARMC OPHTHALMIC DILATING DROPS
1.0000 "application " | OPHTHALMIC | Status: DC | PRN
  Administered 2021-08-10 (×3): 1 via OPHTHALMIC

## 2021-08-10 MED ORDER — SIGHTPATH DOSE#1 BSS IO SOLN
INTRAOCULAR | Status: DC | PRN
  Administered 2021-08-10: 1 mL

## 2021-08-10 SURGICAL SUPPLY — 18 items
CANNULA ANT/CHMB 27G (MISCELLANEOUS) IMPLANT
CANNULA ANT/CHMB 27GA (MISCELLANEOUS) IMPLANT
GLOVE SURG ENC TEXT LTX SZ8 (GLOVE) ×2 IMPLANT
GLOVE SURG TRIUMPH 8.0 PF LTX (GLOVE) ×2 IMPLANT
GOWN STRL REUS W/ TWL LRG LVL3 (GOWN DISPOSABLE) ×2 IMPLANT
GOWN STRL REUS W/TWL LRG LVL3 (GOWN DISPOSABLE) ×4
LENS IOL EYHANCE TORIC II 22.0 ×2 IMPLANT
LENS IOL EYHANCE TRC 150 22.0 IMPLANT
LENS IOL EYHNC TORIC 150 22.0 ×1 IMPLANT
MARKER SKIN DUAL TIP RULER LAB (MISCELLANEOUS) ×1 IMPLANT
NDL FILTER BLUNT 18X1 1/2 (NEEDLE) ×1 IMPLANT
NEEDLE FILTER BLUNT 18X 1/2SAF (NEEDLE) ×1
NEEDLE FILTER BLUNT 18X1 1/2 (NEEDLE) ×1 IMPLANT
PACK EYE AFTER SURG (MISCELLANEOUS) IMPLANT
SYR 3ML LL SCALE MARK (SYRINGE) ×2 IMPLANT
SYR TB 1ML LUER SLIP (SYRINGE) ×2 IMPLANT
WATER STERILE IRR 250ML POUR (IV SOLUTION) ×2 IMPLANT
WIPE NON LINTING 3.25X3.25 (MISCELLANEOUS) ×1 IMPLANT

## 2021-08-10 NOTE — Anesthesia Procedure Notes (Signed)
Procedure Name: MAC Date/Time: 08/10/2021 8:01 AM Performed by: Cameron Ali, CRNA Pre-anesthesia Checklist: Patient identified, Emergency Drugs available, Suction available, Timeout performed and Patient being monitored Patient Re-evaluated:Patient Re-evaluated prior to induction Oxygen Delivery Method: Nasal cannula Placement Confirmation: positive ETCO2

## 2021-08-10 NOTE — Anesthesia Postprocedure Evaluation (Signed)
Anesthesia Post Note  Patient: [name]  Procedure(s) Performed: CATARACT EXTRACTION PHACO AND INTRAOCULAR LENS PLACEMENT (IOC) RIGHT TORIC LENS 9.17 00:57.9  (Right: Eye)     Patient location during evaluation: PACU Anesthesia Type: MAC Level of consciousness: awake and alert Pain management: pain level controlled Vital Signs Assessment: post-procedure vital signs reviewed and stable Respiratory status: spontaneous breathing, nonlabored ventilation, respiratory function stable and patient connected to nasal cannula oxygen Cardiovascular status: stable and blood pressure returned to baseline Postop Assessment: no apparent nausea or vomiting Anesthetic complications: no   No notable events documented.  Trecia Rogers

## 2021-08-10 NOTE — Op Note (Signed)
PREOPERATIVE DIAGNOSIS:  Nuclear sclerotic cataract of the right eye.   POSTOPERATIVE DIAGNOSIS:  Nuclear sclerotic cataract of the right eye.   OPERATIVE PROCEDURE: Procedure(s): CATARACT EXTRACTION PHACO AND INTRAOCULAR LENS PLACEMENT (IOC) RIGHT TORIC LENS   SURGEON:  Birder Robson, MD.   ANESTHESIA: 1.      Managed anesthesia care. 2.     0.2ml of Shugarcaine was instilled following the paracentesis  Anesthesiologist: Rochel Brome, MD CRNA: Cameron Ali, CRNA  COMPLICATIONS:  None.   TECHNIQUE:   Stop and chop    DESCRIPTION OF PROCEDURE:  The patient was examined and consented in the preoperative holding area where the aforementioned topical anesthesia was applied to the right eye.  The patient was brought back to the Operating Room where he was sat upright on the gurney and given a target to fixate upon while the eye was marked at the 3:00 and 9:00 position.  The patient was then reclined on the operating table.  The eye was prepped and draped in the usual sterile ophthalmic fashion and a lid speculum was placed. A paracentesis was created with the side port blade and the anterior chamber was filled with viscoelastic. A near clear corneal incision was performed with the steel keratome. A continuous curvilinear capsulorrhexis was performed with a cystotome followed by the capsulorrhexis forceps. Hydrodissection and hydrodelineation were carried out with BSS on a blunt cannula. The lens was removed in a stop and chop technique and the remaining cortical material was removed with the irrigation-aspiration handpiece. The eye was inflated with viscoelastic and the ZCT  lens  was placed in the eye and rotated to within a few degrees of the predetermined orientation.  The remaining viscoelastic was removed from the eye.  The Sinskey hook was used to rotate the toric lens into its final resting place at 041 degrees.  0. The eye was inflated to a physiologic pressure and found to be watertight.  0.49ml of Vigamox was placed in the anterior chamber.  The eye was dressed with Vigamox. The patient was given protective glasses to wear throughout the day and a shield with which to sleep tonight. The patient was also given drops with which to begin a drop regimen today and will follow-up with me in one day. Implant Name Type Inv. Item Serial No. Manufacturer Lot No. LRB No. Used Action  LENS IOL EYHANCE TORIC II 22.0 - [id number]  LENS IOL Tyler Deis II 22.0 [id number] JOHNSON   Right 1 Implanted   Procedure(s): CATARACT EXTRACTION PHACO AND INTRAOCULAR LENS PLACEMENT (IOC) RIGHT TORIC LENS (Right)  Electronically signed: Birder Robson 08/10/2021 8:21 AM

## 2021-08-10 NOTE — Transfer of Care (Signed)
Immediate Anesthesia Transfer of Care Note  Patient: [name]  Procedure(s) Performed: CATARACT EXTRACTION PHACO AND INTRAOCULAR LENS PLACEMENT (IOC) RIGHT TORIC LENS (Right)  Patient Location: PACU  Anesthesia Type: MAC  Level of Consciousness: awake, alert  and patient cooperative  Airway and Oxygen Therapy: Patient Spontanous Breathing and Patient connected to supplemental oxygen  Post-op Assessment: Post-op Vital signs reviewed, Patient's Cardiovascular Status Stable, Respiratory Function Stable, Patent Airway and No signs of Nausea or vomiting  Post-op Vital Signs: Reviewed and stable  Complications: No notable events documented.

## 2021-08-10 NOTE — H&P (Signed)
[facility]   Primary Care Physician:  McLean-Scocuzza, Nino Glow, MD Ophthalmologist: Dr. George Ina  Pre-Procedure History & Physical: HPI:  [name] is a 75 y.o. male here for cataract surgery.   Past Medical History:  Diagnosis Date   Atrophic kidney    left    Cataract    left eye mild    Chicken pox    COVID-19    09/2019/2021 ? month   Degenerative disc disease, lumbar    L4/5 noted imaging   Dyslipidemia    Encounter for care of pacemaker 05/05/2019   Gallstones    GERD (gastroesophageal reflux disease)    History of esophageal stricture    05/08/09   History of kidney stones    Kidney stones    LVH (left ventricular hypertrophy)    mild on imaging    Presence of permanent cardiac pacemaker    Solitary kidney    left kidney is atrophic     Past Surgical History:  Procedure Laterality Date   FOOT SURGERY     right mortons neuroma    HOLEP-LASER ENUCLEATION OF THE PROSTATE WITH MORCELLATION N/A 07/10/2020   Procedure: HOLEP-LASER ENUCLEATION OF THE PROSTATE WITH MORCELLATION;  Surgeon: Billey Co, MD;  Location: [facility] ORS;  Service: Urology;  Laterality: N/A;   PACEMAKER PLACEMENT     2015 Dr. Caryl Comes for AV block   PERMANENT PACEMAKER INSERTION N/A 02/05/2014   Procedure: PERMANENT PACEMAKER INSERTION;  Surgeon: Deboraha Sprang, MD;  Location: [facility] CATH LAB;  Service: Cardiovascular;  Laterality: N/A;    Prior to Admission medications   Medication Sig Start Date End Date Taking? Authorizing Provider  Ascorbic Acid (VITAMIN C) 1000 MG tablet Take 1,000 mg by mouth daily.   Yes [provider]  carvedilol (COREG) 12.5 MG tablet Take 1 tablet (12.5 mg total) by mouth 2 (two) times daily with a meal. 08/04/21  Yes Adrian Prows, MD  Cholecalciferol (VITAMIN D3) 125 MCG (5000 UT) TABS Take 5,000 Units by mouth daily.    Yes [provider]  ELIQUIS 5 MG TABS tablet TAKE 1 TABLET BY MOUTH TWICE A DAY 06/25/21  Yes Adrian Prows, MD  Multiple  Vitamins-Minerals (PRESERVISION AREDS 2 PO) Take by mouth in the morning and at bedtime.   Yes [provider]  omeprazole (PRILOSEC) 40 MG capsule Take 1 capsule (40 mg total) by mouth daily. 30 minutes before breakfast appt more refills 11/02/20  Yes McLean-Scocuzza, Nino Glow, MD  pravastatin (PRAVACHOL) 40 MG tablet Take 1 tablet (40 mg total) by mouth at bedtime. 05/11/21  Yes McLean-Scocuzza, Nino Glow, MD  Zinc 50 MG CAPS Take 50 mg by mouth daily.    Yes [provider]    Allergies as of 07/09/2021 - Review Complete 05/11/2021  Allergen Reaction Noted   Lipitor [atorvastatin] Other (See Comments) 10/20/2017   Lisinopril Cough 10/20/2017   Simvastatin Other (See Comments) 10/20/2017    Family History  Problem Relation Age of Onset   Diabetes Mother        died of complications    Cancer Father        bladder    Stroke Father     Social History   Socioeconomic History   Marital status: Married    Spouse name: Not on file   Number of children: 2   Years of education: Not on file   Highest education level: Not on file  Occupational History   Not on file  Tobacco Use   Smoking status: Former    Packs/day: 1.00    Years: 20.00    Pack years: 20.00    Types: Cigarettes    Quit date: 10/21/1991    Years since quitting: 29.8   Smokeless tobacco: Never   Tobacco comments:    smoked [id number] <1 ppd no FH lung cancer   Vaping Use   Vaping Use: Never used  Substance and Sexual Activity   Alcohol use: Yes    Alcohol/week: 7.0 standard drinks    Types: 7 Standard drinks or equivalent per week    Comment: Drinks 2-3 ounces of liquor daily   Drug use: No   Sexual activity: Yes    Partners: Female    Birth control/protection: None  Other Topics Concern   Not on file  Social History Narrative   Used to be in the [place]    2 kids (son and daughter)   Married    Born [place] grew up in [place]    Works part time at [employer]    Former smoker 1968 to 1998 <  1ppd no FH lung cancer    Drinks 2-3 small shots of liquor daily, no drugs, never chewed tobacco    Social Determinants of Radio broadcast assistant Strain: Not on file  Food Insecurity: No Food Insecurity   Worried About Charity fundraiser in the Last Year: Never true   Arboriculturist in the Last Year: Never true  Transportation Needs: No Transportation Needs   Lack of Transportation (Medical): No   Lack of Transportation (Non-Medical): No  Physical Activity: Sufficiently Active   Days of Exercise per Week: 5 days   Minutes of Exercise per Session: 30 min  Stress: No Stress Concern Present   Feeling of Stress : Not at all  Social Connections: Unknown   Frequency of Communication with Friends and Family: More than three times a week   Frequency of Social Gatherings with Friends and Family: More than three times a week   Attends Religious Services: Not on Electrical engineer or Organizations: Not on file   Attends Archivist Meetings: Not on file   Marital Status: Not on file  Intimate Partner Violence: Not At Risk   Fear of Current or Ex-Partner: No   Emotionally Abused: No   Physically Abused: No   Sexually Abused: No    Review of Systems: See HPI, otherwise negative ROS  Physical Exam: BP (!) 145/95   Pulse 60   Temp (!) 97.2 F (36.2 C) (Temporal)   Ht 6\' 1"  (1.854 m)   Wt 93.8 kg   SpO2 99%   BMI 27.28 kg/m  General:   Alert, cooperative in NAD Head:  Normocephalic and atraumatic. Respiratory:  Normal work of breathing. Cardiovascular:  RRR  Impression/Plan: [name] is here for cataract surgery.  Risks, benefits, limitations, and alternatives regarding cataract surgery have been reviewed with the patient.  Questions have been answered.  All parties agreeable.   Birder Robson, MD  08/10/2021, 7:54 AM

## 2021-08-10 NOTE — Anesthesia Preprocedure Evaluation (Addendum)
Anesthesia Evaluation  Patient identified by MRN, date of birth, ID band Patient awake    Reviewed: Allergy & Precautions, H&P , NPO status , Patient's Chart, lab work & pertinent test results, reviewed documented beta blocker date and time   Airway Mallampati: II  TM Distance: >3 FB Neck ROM: full    Dental no notable dental hx.    Pulmonary former smoker,    Pulmonary exam normal breath sounds clear to auscultation       Cardiovascular Exercise Tolerance: Good hypertension, + CAD  Normal cardiovascular exam+ pacemaker  Rhythm:regular Rate:Normal     Neuro/Psych negative neurological ROS  negative psych ROS   GI/Hepatic Neg liver ROS, GERD  ,  Endo/Other  negative endocrine ROS  Renal/GU negative Renal ROS  negative genitourinary   Musculoskeletal   Abdominal   Peds  Hematology negative hematology ROS (+)   Anesthesia Other Findings   Reproductive/Obstetrics negative OB ROS                             Anesthesia Physical Anesthesia Plan  ASA: 3  Anesthesia Plan: MAC   Post-op Pain Management:    Induction:   PONV Risk Score and Plan:   Airway Management Planned:   Additional Equipment:   Intra-op Plan:   Post-operative Plan:   Informed Consent: I have reviewed the patients History and Physical, chart, labs and discussed the procedure including the risks, benefits and alternatives for the proposed anesthesia with the patient or authorized representative who has indicated his/her understanding and acceptance.     Dental Advisory Given  Plan Discussed with: CRNA and Anesthesiologist  Anesthesia Plan Comments:        Anesthesia Quick Evaluation

## 2021-08-11 ENCOUNTER — Encounter: Payer: Self-pay | Admitting: Ophthalmology

## 2021-08-11 ENCOUNTER — Other Ambulatory Visit: Payer: Self-pay

## 2021-08-13 DIAGNOSIS — Z23 Encounter for immunization: Secondary | ICD-10-CM | POA: Diagnosis not present

## 2021-08-16 DIAGNOSIS — H2512 Age-related nuclear cataract, left eye: Secondary | ICD-10-CM | POA: Diagnosis not present

## 2021-08-26 NOTE — Discharge Instructions (Signed)

## 2021-08-31 ENCOUNTER — Ambulatory Visit: Payer: Medicare Other | Admitting: Anesthesiology

## 2021-08-31 ENCOUNTER — Ambulatory Visit
Admission: RE | Admit: 2021-08-31 | Discharge: 2021-08-31 | Disposition: A | Discharge status: Home / Self Care | Payer: Medicare Other | Attending: Ophthalmology | Admitting: Ophthalmology

## 2021-08-31 ENCOUNTER — Other Ambulatory Visit: Payer: Self-pay

## 2021-08-31 ENCOUNTER — Encounter: Payer: Self-pay | Admitting: Ophthalmology

## 2021-08-31 ENCOUNTER — Encounter
Admission: RE | Disposition: A | Discharge status: Home / Self Care | Payer: Self-pay | Source: Home / Self Care | Attending: Ophthalmology

## 2021-08-31 DIAGNOSIS — Z888 Allergy status to other drugs, medicaments and biological substances status: Secondary | ICD-10-CM | POA: Insufficient documentation

## 2021-08-31 DIAGNOSIS — Z8616 Personal history of COVID-19: Secondary | ICD-10-CM | POA: Diagnosis not present

## 2021-08-31 DIAGNOSIS — K219 Gastro-esophageal reflux disease without esophagitis: Secondary | ICD-10-CM | POA: Diagnosis not present

## 2021-08-31 DIAGNOSIS — Z79899 Other long term (current) drug therapy: Secondary | ICD-10-CM | POA: Diagnosis not present

## 2021-08-31 DIAGNOSIS — E785 Hyperlipidemia, unspecified: Secondary | ICD-10-CM | POA: Diagnosis not present

## 2021-08-31 DIAGNOSIS — Z950 Presence of cardiac pacemaker: Secondary | ICD-10-CM | POA: Insufficient documentation

## 2021-08-31 DIAGNOSIS — Z7901 Long term (current) use of anticoagulants: Secondary | ICD-10-CM | POA: Insufficient documentation

## 2021-08-31 DIAGNOSIS — Z87891 Personal history of nicotine dependence: Secondary | ICD-10-CM | POA: Diagnosis not present

## 2021-08-31 DIAGNOSIS — H2512 Age-related nuclear cataract, left eye: Secondary | ICD-10-CM | POA: Insufficient documentation

## 2021-08-31 DIAGNOSIS — H25812 Combined forms of age-related cataract, left eye: Secondary | ICD-10-CM | POA: Diagnosis not present

## 2021-08-31 HISTORY — PX: CATARACT EXTRACTION W/PHACO: SHX586

## 2021-08-31 SURGERY — PHACOEMULSIFICATION, CATARACT, WITH IOL INSERTION
Anesthesia: Monitor Anesthesia Care | Site: Eye | Laterality: Left

## 2021-08-31 MED ORDER — SIGHTPATH DOSE#1 NA CHONDROIT SULF-NA HYALURON 40-17 MG/ML IO SOLN
INTRAOCULAR | Status: DC | PRN
  Administered 2021-08-31: 1 mL via INTRAOCULAR

## 2021-08-31 MED ORDER — MIDAZOLAM HCL 2 MG/2ML IJ SOLN
INTRAMUSCULAR | Status: DC | PRN
  Administered 2021-08-31: 2 mg via INTRAVENOUS

## 2021-08-31 MED ORDER — TETRACAINE HCL 0.5 % OP SOLN
1.0000 [drp] | OPHTHALMIC | Status: DC | PRN
  Administered 2021-08-31 (×3): 1 [drp] via OPHTHALMIC

## 2021-08-31 MED ORDER — SIGHTPATH DOSE#1 BSS IO SOLN
INTRAOCULAR | Status: DC | PRN
  Administered 2021-08-31: 56 mL via OPHTHALMIC

## 2021-08-31 MED ORDER — LACTATED RINGERS IV SOLN: INTRAVENOUS | Status: DC

## 2021-08-31 MED ORDER — BRIMONIDINE TARTRATE-TIMOLOL 0.2-0.5 % OP SOLN
OPHTHALMIC | Status: DC | PRN
  Administered 2021-08-31: 1 [drp] via OPHTHALMIC

## 2021-08-31 MED ORDER — SIGHTPATH DOSE#1 BSS IO SOLN
INTRAOCULAR | Status: DC | PRN
  Administered 2021-08-31: 15 mL via INTRAOCULAR

## 2021-08-31 MED ORDER — ARMC OPHTHALMIC DILATING DROPS
1.0000 "application " | OPHTHALMIC | Status: DC | PRN
  Administered 2021-08-31 (×3): 1 via OPHTHALMIC

## 2021-08-31 MED ORDER — FENTANYL CITRATE (PF) 100 MCG/2ML IJ SOLN
INTRAMUSCULAR | Status: DC | PRN
  Administered 2021-08-31 (×2): 50 ug via INTRAVENOUS

## 2021-08-31 MED ORDER — MOXIFLOXACIN HCL 0.5 % OP SOLN
OPHTHALMIC | Status: DC | PRN
  Administered 2021-08-31: 0.2 mL via OPHTHALMIC

## 2021-08-31 MED ORDER — SIGHTPATH DOSE#1 BSS IO SOLN
INTRAOCULAR | Status: DC | PRN
  Administered 2021-08-31: 2 mL

## 2021-08-31 SURGICAL SUPPLY — 19 items
CANNULA ANT/CHMB 27G (MISCELLANEOUS) IMPLANT
CANNULA ANT/CHMB 27GA (MISCELLANEOUS) IMPLANT
GLOVE SURG ENC TEXT LTX SZ8 (GLOVE) ×2 IMPLANT
GLOVE SURG TRIUMPH 8.0 PF LTX (GLOVE) ×2 IMPLANT
GOWN STRL REUS W/ TWL LRG LVL3 (GOWN DISPOSABLE) ×2 IMPLANT
GOWN STRL REUS W/TWL LRG LVL3 (GOWN DISPOSABLE) ×4
LENS IOL TECNIS EYHANCE 16.5 (Intraocular Lens) ×1 IMPLANT
MARKER SKIN DUAL TIP RULER LAB (MISCELLANEOUS) ×1 IMPLANT
NDL FILTER BLUNT 18X1 1/2 (NEEDLE) ×1 IMPLANT
NEEDLE FILTER BLUNT 18X 1/2SAF (NEEDLE) ×1
NEEDLE FILTER BLUNT 18X1 1/2 (NEEDLE) ×1 IMPLANT
PACK EYE AFTER SURG (MISCELLANEOUS) IMPLANT
RING MALYGIN (MISCELLANEOUS) IMPLANT
SUT ETHILON 10-0 CS-B-6CS-B-6 (SUTURE)
SUTURE EHLN 10-0 CS-B-6CS-B-6 (SUTURE) IMPLANT
SYR 3ML LL SCALE MARK (SYRINGE) ×2 IMPLANT
SYR TB 1ML LUER SLIP (SYRINGE) ×2 IMPLANT
WATER STERILE IRR 250ML POUR (IV SOLUTION) ×2 IMPLANT
WIPE NON LINTING 3.25X3.25 (MISCELLANEOUS) ×1 IMPLANT

## 2021-08-31 NOTE — Anesthesia Postprocedure Evaluation (Signed)
Anesthesia Post Note  Patient: [name]  Procedure(s) Performed: CATARACT EXTRACTION PHACO AND INTRAOCULAR LENS PLACEMENT (IOC) LEFT 8.45 00:47.8 (Left: Eye)     Patient location during evaluation: PACU Anesthesia Type: MAC Level of consciousness: awake and alert Pain management: pain level controlled Vital Signs Assessment: post-procedure vital signs reviewed and stable Respiratory status: spontaneous breathing, nonlabored ventilation, respiratory function stable and patient connected to nasal cannula oxygen Cardiovascular status: stable and blood pressure returned to baseline Postop Assessment: no apparent nausea or vomiting Anesthetic complications: no   No notable events documented.  Wanda Plump Ameera Tigue

## 2021-08-31 NOTE — Anesthesia Preprocedure Evaluation (Signed)
Anesthesia Evaluation  Patient identified by MRN, date of birth, ID band Patient awake    Reviewed: Allergy & Precautions, H&P , NPO status , Patient's Chart, lab work & pertinent test results, reviewed documented beta blocker date and time   Airway Mallampati: II  TM Distance: >3 FB Neck ROM: full    Dental no notable dental hx.    Pulmonary former smoker,    Pulmonary exam normal breath sounds clear to auscultation       Cardiovascular Exercise Tolerance: Good hypertension, + CAD  Normal cardiovascular exam+ pacemaker  Rhythm:regular Rate:Normal     Neuro/Psych negative neurological ROS  negative psych ROS   GI/Hepatic Neg liver ROS, GERD  ,  Endo/Other  negative endocrine ROS  Renal/GU negative Renal ROS  negative genitourinary   Musculoskeletal   Abdominal   Peds  Hematology negative hematology ROS (+)   Anesthesia Other Findings   Reproductive/Obstetrics negative OB ROS                             Anesthesia Physical  Anesthesia Plan  ASA: 3  Anesthesia Plan: MAC   Post-op Pain Management:    Induction:   PONV Risk Score and Plan:   Airway Management Planned: Nasal Cannula  Additional Equipment:   Intra-op Plan:   Post-operative Plan:   Informed Consent: I have reviewed the patients History and Physical, chart, labs and discussed the procedure including the risks, benefits and alternatives for the proposed anesthesia with the patient or authorized representative who has indicated his/her understanding and acceptance.       Plan Discussed with: CRNA, Anesthesiologist and Surgeon  Anesthesia Plan Comments:         Anesthesia Quick Evaluation

## 2021-08-31 NOTE — H&P (Signed)
[facility]   Primary Care Physician:  McLean-Scocuzza, Nino Glow, MD Ophthalmologist: Dr.Zakry Caso  Pre-Procedure History & Physical: HPI:  [name] is a 75 y.o. male here for cataract surgery.   Past Medical History:  Diagnosis Date   Atrophic kidney    left    Cataract    left eye mild    Chicken pox    COVID-19    09/2019/2021 ? month   Degenerative disc disease, lumbar    L4/5 noted imaging   Dyslipidemia    Encounter for care of pacemaker 05/05/2019   Gallstones    GERD (gastroesophageal reflux disease)    History of esophageal stricture    05/08/09   History of kidney stones    Kidney stones    LVH (left ventricular hypertrophy)    mild on imaging    Presence of permanent cardiac pacemaker    Solitary kidney    left kidney is atrophic     Past Surgical History:  Procedure Laterality Date   CATARACT EXTRACTION W/PHACO Right 08/10/2021   Procedure: CATARACT EXTRACTION PHACO AND INTRAOCULAR LENS PLACEMENT ([place]) RIGHT TORIC LENS 9.17 00:57.9 ;  Surgeon: Birder Robson, MD;  Location: [place];  Service: Ophthalmology;  Laterality: Right;   FOOT SURGERY     right mortons neuroma    HOLEP-LASER ENUCLEATION OF THE PROSTATE WITH MORCELLATION N/A 07/10/2020   Procedure: HOLEP-LASER ENUCLEATION OF THE PROSTATE WITH MORCELLATION;  Surgeon: Billey Co, MD;  Location: [facility] ORS;  Service: Urology;  Laterality: N/A;   PACEMAKER PLACEMENT     2015 Dr. Caryl Comes for AV block   PERMANENT PACEMAKER INSERTION N/A 02/05/2014   Procedure: PERMANENT PACEMAKER INSERTION;  Surgeon: Deboraha Sprang, MD;  Location: [facility] CATH LAB;  Service: Cardiovascular;  Laterality: N/A;    Prior to Admission medications   Medication Sig Start Date End Date Taking? Authorizing Provider  Ascorbic Acid (VITAMIN C) 1000 MG tablet Take 1,000 mg by mouth daily.   Yes [provider]  carvedilol (COREG) 12.5 MG tablet Take 1 tablet (12.5 mg total) by mouth 2 (two) times daily  with a meal. 08/04/21  Yes Adrian Prows, MD  Cholecalciferol (VITAMIN D3) 125 MCG (5000 UT) TABS Take 5,000 Units by mouth daily.    Yes [provider]  ELIQUIS 5 MG TABS tablet TAKE 1 TABLET BY MOUTH TWICE A DAY 06/25/21  Yes Adrian Prows, MD  Multiple Vitamins-Minerals (PRESERVISION AREDS 2 PO) Take by mouth in the morning and at bedtime.   Yes [provider]  omeprazole (PRILOSEC) 40 MG capsule Take 1 capsule (40 mg total) by mouth daily. 30 minutes before breakfast appt more refills 11/02/20  Yes McLean-Scocuzza, Nino Glow, MD  pravastatin (PRAVACHOL) 40 MG tablet Take 1 tablet (40 mg total) by mouth at bedtime. 05/11/21  Yes McLean-Scocuzza, Nino Glow, MD  Zinc 50 MG CAPS Take 50 mg by mouth daily.    Yes [provider]    Allergies as of 07/09/2021 - Review Complete 05/11/2021  Allergen Reaction Noted   Lipitor [atorvastatin] Other (See Comments) 10/20/2017   Lisinopril Cough 10/20/2017   Simvastatin Other (See Comments) 10/20/2017    Family History  Problem Relation Age of Onset   Diabetes Mother        died of complications    Cancer Father        bladder    Stroke Father     Social History   Socioeconomic History   Marital status:  Married    Spouse name: Not on file   Number of children: 2   Years of education: Not on file   Highest education level: Not on file  Occupational History   Not on file  Tobacco Use   Smoking status: Former    Packs/day: 1.00    Years: 20.00    Pack years: 20.00    Types: Cigarettes    Quit date: 10/21/1991    Years since quitting: 29.8   Smokeless tobacco: Never   Tobacco comments:    smoked [id number] <1 ppd no FH lung cancer   Vaping Use   Vaping Use: Never used  Substance and Sexual Activity   Alcohol use: Yes    Alcohol/week: 7.0 standard drinks    Types: 7 Standard drinks or equivalent per week    Comment: Drinks 2-3 ounces of liquor daily   Drug use: No   Sexual activity: Yes    Partners: Female     Birth control/protection: None  Other Topics Concern   Not on file  Social History Narrative   Used to be in the [place]    2 kids (son and daughter)   Married    Born [place] grew up in [place]    Works part time at [employer]    Former smoker 1968 to 1998 < 1ppd no FH lung cancer    Drinks 2-3 small shots of liquor daily, no drugs, never chewed tobacco    Social Determinants of Radio broadcast assistant Strain: Not on file  Food Insecurity: No Food Insecurity   Worried About Charity fundraiser in the Last Year: Never true   Arboriculturist in the Last Year: Never true  Transportation Needs: No Transportation Needs   Lack of Transportation (Medical): No   Lack of Transportation (Non-Medical): No  Physical Activity: Sufficiently Active   Days of Exercise per Week: 5 days   Minutes of Exercise per Session: 30 min  Stress: No Stress Concern Present   Feeling of Stress : Not at all  Social Connections: Unknown   Frequency of Communication with Friends and Family: More than three times a week   Frequency of Social Gatherings with Friends and Family: More than three times a week   Attends Religious Services: Not on Electrical engineer or Organizations: Not on file   Attends Archivist Meetings: Not on file   Marital Status: Not on file  Intimate Partner Violence: Not At Risk   Fear of Current or Ex-Partner: No   Emotionally Abused: No   Physically Abused: No   Sexually Abused: No    Review of Systems: See HPI, otherwise negative ROS  Physical Exam: BP 126/85   Pulse 62   Temp 97.6 F (36.4 C)   Wt 93.4 kg   SpO2 99%   BMI 27.18 kg/m  General:   Alert, cooperative in NAD Head:  Normocephalic and atraumatic. Respiratory:  Normal work of breathing. Cardiovascular:  RRR  Impression/Plan: [name] is here for cataract surgery.  Risks, benefits, limitations, and alternatives regarding cataract surgery have been reviewed with the patient.   Questions have been answered.  All parties agreeable.   Birder Robson, MD  08/31/2021, 1:04 PM

## 2021-08-31 NOTE — Op Note (Signed)
PREOPERATIVE DIAGNOSIS:  Nuclear sclerotic cataract of the left eye.   POSTOPERATIVE DIAGNOSIS:  Nuclear sclerotic cataract of the left eye.   OPERATIVE PROCEDURE:ORPROCALL@   SURGEON:  Birder Robson, MD.   ANESTHESIA:  Anesthesiologist: Carlos American, MD CRNA: Jeannene Patella, CRNA  1.      Managed anesthesia care. 2.     0.47ml of Shugarcaine was instilled following the paracentesis   COMPLICATIONS:  None.   TECHNIQUE:   Stop and chop   DESCRIPTION OF PROCEDURE:  The patient was examined and consented in the preoperative holding area where the aforementioned topical anesthesia was applied to the left eye and then brought back to the Operating Room where the left eye was prepped and draped in the usual sterile ophthalmic fashion and a lid speculum was placed. A paracentesis was created with the side port blade and the anterior chamber was filled with viscoelastic. A near clear corneal incision was performed with the steel keratome. A continuous curvilinear capsulorrhexis was performed with a cystotome followed by the capsulorrhexis forceps. Hydrodissection and hydrodelineation were carried out with BSS on a blunt cannula. The lens was removed in a stop and chop  technique and the remaining cortical material was removed with the irrigation-aspiration handpiece. The capsular bag was inflated with viscoelastic and the Technis [id number] lens was placed in the capsular bag without complication. The remaining viscoelastic was removed from the eye with the irrigation-aspiration handpiece. The wounds were hydrated. The anterior chamber was flushed with BSS and the eye was inflated to physiologic pressure. 0.85ml Vigamox was placed in the anterior chamber. The wounds were found to be water tight. The eye was dressed with Combigan. The patient was given protective glasses to wear throughout the day and a shield with which to sleep tonight. The patient was also given drops with which to begin a drop  regimen today and will follow-up with me in one day. Implant Name Type Inv. Item Serial No. Manufacturer Lot No. LRB No. Used Action  LENS IOL TECNIS EYHANCE 16.5 - [id number] Intraocular Lens LENS IOL TECNIS EYHANCE 16.5 [id number] JOHNSON   Left 1 Implanted    Procedure(s): CATARACT EXTRACTION PHACO AND INTRAOCULAR LENS PLACEMENT (IOC) LEFT 8.45 00:47.8 (Left)  Electronically signed: Birder Robson 08/31/2021 1:26 PM

## 2021-08-31 NOTE — Transfer of Care (Signed)
Immediate Anesthesia Transfer of Care Note  Patient: [name]  Procedure(s) Performed: CATARACT EXTRACTION PHACO AND INTRAOCULAR LENS PLACEMENT (IOC) LEFT 8.45 00:47.8 (Left: Eye)  Patient Location: PACU  Anesthesia Type: MAC  Level of Consciousness: awake, alert  and patient cooperative  Airway and Oxygen Therapy: Patient Spontanous Breathing and Patient connected to supplemental oxygen  Post-op Assessment: Post-op Vital signs reviewed, Patient's Cardiovascular Status Stable, Respiratory Function Stable, Patent Airway and No signs of Nausea or vomiting  Post-op Vital Signs: Reviewed and stable  Complications: No notable events documented.

## 2021-08-31 NOTE — Anesthesia Procedure Notes (Signed)
Procedure Name: MAC Date/Time: 08/31/2021 1:11 PM Performed by: Jeannene Patella, CRNA Pre-anesthesia Checklist: Patient identified, Emergency Drugs available, Suction available, Timeout performed and Patient being monitored Patient Re-evaluated:Patient Re-evaluated prior to induction Oxygen Delivery Method: Nasal cannula Placement Confirmation: positive ETCO2

## 2021-09-01 ENCOUNTER — Encounter: Payer: Self-pay | Admitting: Ophthalmology

## 2021-09-23 ENCOUNTER — Other Ambulatory Visit: Payer: Self-pay | Admitting: Cardiology

## 2021-09-23 DIAGNOSIS — I480 Paroxysmal atrial fibrillation: Secondary | ICD-10-CM

## 2021-10-20 ENCOUNTER — Ambulatory Visit (INDEPENDENT_AMBULATORY_CARE_PROVIDER_SITE_OTHER): Payer: Medicare Other | Admitting: Orthopaedic Surgery

## 2021-10-20 DIAGNOSIS — I2510 Atherosclerotic heart disease of native coronary artery without angina pectoris: Secondary | ICD-10-CM

## 2021-10-20 DIAGNOSIS — G8929 Other chronic pain: Secondary | ICD-10-CM

## 2021-10-20 DIAGNOSIS — M1711 Unilateral primary osteoarthritis, right knee: Secondary | ICD-10-CM | POA: Diagnosis not present

## 2021-10-20 DIAGNOSIS — M25561 Pain in right knee: Secondary | ICD-10-CM

## 2021-10-20 MED ORDER — HYALURONAN 88 MG/4ML IX SOSY
88.0000 mg | PREFILLED_SYRINGE | INTRA_ARTICULAR | Status: AC | PRN
  Administered 2021-10-20: 88 mg via INTRA_ARTICULAR

## 2021-10-20 NOTE — Progress Notes (Signed)
Office Visit Note   Patient: [name]           Date of Birth: 1946/05/29           MRN: [id number] Visit Date: 10/20/2021              Requested by: McLean-Scocuzza, Nino Glow, MD [place],  [place] [id number] PCP: McLean-Scocuzza, Nino Glow, MD   Assessment & Plan: Visit Diagnoses:  1. Chronic pain of right knee   2. Unilateral primary osteoarthritis, right knee     Plan: Per the patient's request I did place Monovisc in his right knee today without difficulty.  Follow-up can be as needed.  Follow-Up Instructions: Return if symptoms worsen or fail to improve.   Orders:  Orders Placed This Encounter  Procedures   Large Joint Inj   No orders of the defined types were placed in this encounter.     Procedures: Large Joint Inj: R knee on 10/20/2021 4:02 PM Indications: diagnostic evaluation and pain Details: 22 G 1.5 in needle, superolateral approach  Arthrogram: No  Medications: 88 mg Hyaluronan 88 MG/4ML Outcome: tolerated well, no immediate complications Procedure, treatment alternatives, risks and benefits explained, specific risks discussed. Consent was given by the patient. Immediately prior to procedure a time out was called to verify the correct patient, procedure, equipment, support staff and site/side marked as required. Patient was prepped and draped in the usual sterile fashion.      Clinical Data: No additional findings.   Subjective: Chief Complaint  Patient presents with   Right Knee - Pain  The patient is well-known to me.  Is been a while since have seen him.  He has well-documented moderate osteoarthritis of the right knee.  He has had hyaluronic acid in the past and has helped him quite a bit.  His last injection was about 3 years ago.  He said the knee started to bother him again and he is hoping to have a hyaluronic acid injection because that is what is helped him the most to treat the pain from osteoarthritis of his right knee.   He has had no acute change in medical status.  He denies any locking catching.  He does occasionally have left hip pain over the bursa area.  I injected the area before as well.  He is now diabetic.  HPI  Review of Systems There is currently listed no headache, chest pain, short of breath, fever, chills, nausea, vomiting  Objective: Vital Signs: There were no vitals taken for this visit.  Physical Exam She is alert and oriented x3 and in no acute distress Ortho Exam Examination of his right knee shows no effusion.  Is a cool feeling joint.  He has good range of motion of the knee but medial joint line tenderness and varus malalignment.  There is slight patellofemoral pain. Specialty Comments:  No specialty comments available.  Imaging: No results found.   PMFS History: Patient Active Problem List   Diagnosis Date Noted   Paroxysmal atrial fibrillation ([place]) 07/23/2021   Age-related cataract of right eye 05/11/2021   Hydronephrosis, left 05/18/2020   Fatty liver 05/15/2020   Granulomatous disease ([place]) 05/15/2020   Gallstones 05/15/2020   Bursitis of right hip 05/01/2020   Coronary artery disease involving native coronary artery of native heart without angina pectoris 05/01/2020   Encounter for care of pacemaker 05/05/2019   Pacemaker St. Jude PM  2240 Dual chamber pacemaker implant  02/05/2014 04/01/2019  History of kidney stones 03/26/2019   Malignant melanoma in situ ([place]) 05/01/2018   HLD (hyperlipidemia) 03/23/2018   Multiple nevi 03/23/2018   Overactive bladder 11/16/2017   HTN (hypertension) 10/20/2017   BPH (benign prostatic hyperplasia) 10/20/2017   Impingement syndrome of right shoulder 03/08/2017   Coronary artery calcification 02/07/2014   Solitary kidney 02/05/2014   GERD (gastroesophageal reflux disease) 02/05/2014   Dyslipidemia 02/05/2014   Atrioventricular block, complete ([place]) 02/05/2014   Past Medical History:  Diagnosis Date   Atrophic kidney     left    Cataract    left eye mild    Chicken pox    COVID-19    09/2019/2021 ? month   Degenerative disc disease, lumbar    L4/5 noted imaging   Dyslipidemia    Encounter for care of pacemaker 05/05/2019   Gallstones    GERD (gastroesophageal reflux disease)    History of esophageal stricture    05/08/09   History of kidney stones    Kidney stones    LVH (left ventricular hypertrophy)    mild on imaging    Presence of permanent cardiac pacemaker    Solitary kidney    left kidney is atrophic     Family History  Problem Relation Age of Onset   Diabetes Mother        died of complications    Cancer Father        bladder    Stroke Father     Past Surgical History:  Procedure Laterality Date   CATARACT EXTRACTION W/PHACO Right 08/10/2021   Procedure: CATARACT EXTRACTION PHACO AND INTRAOCULAR LENS PLACEMENT ([place]) RIGHT TORIC LENS 9.17 00:57.9 ;  Surgeon: Birder Robson, MD;  Location: [facility];  Service: Ophthalmology;  Laterality: Right;   CATARACT EXTRACTION W/PHACO Left 08/31/2021   Procedure: CATARACT EXTRACTION PHACO AND INTRAOCULAR LENS PLACEMENT ([facility]) LEFT 8.45 00:47.8;  Surgeon: Birder Robson, MD;  Location: [facility];  Service: Ophthalmology;  Laterality: Left;   FOOT SURGERY     right mortons neuroma    HOLEP-LASER ENUCLEATION OF THE PROSTATE WITH MORCELLATION N/A 07/10/2020   Procedure: HOLEP-LASER ENUCLEATION OF THE PROSTATE WITH MORCELLATION;  Surgeon: Billey Co, MD;  Location: [facility];  Service: Urology;  Laterality: N/A;   PACEMAKER PLACEMENT     2015 Dr. Caryl Comes for AV block   PERMANENT PACEMAKER INSERTION N/A 02/05/2014   Procedure: PERMANENT PACEMAKER INSERTION;  Surgeon: Deboraha Sprang, MD;  [facility] C[facility];  Service: Cardiovascular;  Laterality: N/A;   Social History   Occupational History   Not on file  Tobacco Use   Smoking status: Former    Packs/day: 1.00    Years: 20.00    Pack years: 20.00    Types:  Cigarettes    Quit date: 10/21/1991    Years since quitting: 30.0   Smokeless tobacco: Never   Tobacco comments:    smoked [id number] <1 ppd no FH lung cancer   Vaping Use   Vaping Use: Never used  Substance and Sexual Activity   Alcohol use: Yes    Alcohol/week: 7.0 standard drinks    Types: 7 Standard drinks or equivalent per week    Comment: Drinks 2-3 ounces of liquor daily   Drug use: No   Sexual activity: Yes    Partners: Female    Birth control/protection: None

## 2021-10-27 ENCOUNTER — Telehealth: Payer: Self-pay | Admitting: Cardiology

## 2021-10-27 ENCOUNTER — Other Ambulatory Visit: Payer: Self-pay

## 2021-10-27 DIAGNOSIS — I10 Essential (primary) hypertension: Secondary | ICD-10-CM

## 2021-10-27 DIAGNOSIS — I480 Paroxysmal atrial fibrillation: Secondary | ICD-10-CM

## 2021-10-27 MED ORDER — CARVEDILOL 12.5 MG PO TABS: 12.5000 mg | ORAL_TABLET | Freq: Two times a day (BID) | ORAL | 0 refills | Status: DC

## 2021-10-27 MED ORDER — CARVEDILOL 12.5 MG PO TABS: 12.5000 mg | ORAL_TABLET | Freq: Two times a day (BID) | ORAL | 3 refills | Status: DC

## 2021-10-27 NOTE — Telephone Encounter (Signed)
Refill sent.

## 2021-10-27 NOTE — Telephone Encounter (Signed)
Pt needs a updated prescription sent to his new pharmacy as they are requiring it to continue filling it.  Please update and send to   [place]

## 2021-12-11 ENCOUNTER — Other Ambulatory Visit: Payer: Self-pay | Admitting: Internal Medicine

## 2021-12-11 ENCOUNTER — Other Ambulatory Visit: Payer: Self-pay | Admitting: Cardiology

## 2021-12-11 DIAGNOSIS — K219 Gastro-esophageal reflux disease without esophagitis: Secondary | ICD-10-CM

## 2021-12-11 DIAGNOSIS — I2510 Atherosclerotic heart disease of native coronary artery without angina pectoris: Secondary | ICD-10-CM

## 2021-12-11 DIAGNOSIS — I480 Paroxysmal atrial fibrillation: Secondary | ICD-10-CM

## 2021-12-13 NOTE — Telephone Encounter (Signed)
Patient says his pharmacy is waiting for this office to reply to refilling the following medications; omeprazole (PRILOSEC) 40 MG capsule, pravastatin (PRAVACHOL) 40 MG tablet, and carvedilol (COREG) 12.5 MG tablet. [place], [place] seen in office 05/11/2021.

## 2021-12-20 ENCOUNTER — Other Ambulatory Visit: Payer: Self-pay | Admitting: Cardiology

## 2021-12-20 ENCOUNTER — Other Ambulatory Visit: Payer: Self-pay | Admitting: Internal Medicine

## 2021-12-20 DIAGNOSIS — K219 Gastro-esophageal reflux disease without esophagitis: Secondary | ICD-10-CM

## 2021-12-20 DIAGNOSIS — I480 Paroxysmal atrial fibrillation: Secondary | ICD-10-CM

## 2022-01-07 DIAGNOSIS — Z45018 Encounter for adjustment and management of other part of cardiac pacemaker: Secondary | ICD-10-CM | POA: Diagnosis not present

## 2022-01-07 DIAGNOSIS — I442 Atrioventricular block, complete: Secondary | ICD-10-CM | POA: Diagnosis not present

## 2022-01-21 ENCOUNTER — Other Ambulatory Visit: Payer: Self-pay | Admitting: Cardiology

## 2022-01-21 DIAGNOSIS — I480 Paroxysmal atrial fibrillation: Secondary | ICD-10-CM

## 2022-02-21 ENCOUNTER — Ambulatory Visit: Payer: Medicare Other | Admitting: Student

## 2022-02-21 ENCOUNTER — Encounter: Payer: Self-pay | Admitting: Student

## 2022-02-21 VITALS — BP 132/87 | HR 67 | Temp 98.0°F | Resp 16 | Ht 73.0 in | Wt 208.0 lb

## 2022-02-21 DIAGNOSIS — I10 Essential (primary) hypertension: Secondary | ICD-10-CM | POA: Diagnosis not present

## 2022-02-21 DIAGNOSIS — I480 Paroxysmal atrial fibrillation: Secondary | ICD-10-CM | POA: Diagnosis not present

## 2022-02-21 DIAGNOSIS — Z950 Presence of cardiac pacemaker: Secondary | ICD-10-CM

## 2022-02-21 DIAGNOSIS — I442 Atrioventricular block, complete: Secondary | ICD-10-CM | POA: Diagnosis not present

## 2022-02-21 NOTE — Progress Notes (Signed)
? ?Primary Physician/Referring:  Mc[facility]-Scocuzza, Nino Glow, MD ? ?Patient ID: [name], male    DOB: 05/18/1946, 76 y.o.   MRN: [id number] ? ?Chief Complaint  ?Patient presents with  ? Atrial Fibrillation  ? Hypertension  ? Hyperlipidemia  ? Follow-up  ?  1 year  ? ? ?HPI: [name]  is a 76 y.o. male  with hypertension, hyperlipidemia, coronary calcification by CT chest in 2015, unilateral kidney due to congenital absence of left kidney, complete heart block S/P St. Jude PM  2240  Dual chamber pacemaker implant  02/05/2014: Olin Pia, MD, paroxysmal atrial fibrillation noted on pacemaker transmission, has been on Eliquis since 06/24/2019. He has history of known intolerance to statins, however on low dose of pravastatin, his lipids are improved significantly. ? ?Patient presents for annual follow-up.  Pacemaker transmission in 07/2021 revealed patient has been in persistent atrial fibrillation since July 2022, therefore Dr. Einar Gip increase carvedilol from 6.25 mg to 12.5 mg twice daily.  Patient is feeling well overall without specific complaints today.  Denies chest pain, palpitations, dyspnea, dizziness, syncope, near syncope.  Continues to tolerate anticoagulation without bleeding diathesis.  Review of pacemaker transmissions reveals he has maintained normal sinus rhythm since 07/2021. ? ?Past Medical History:  ?Diagnosis Date  ? Atrophic kidney   ? left   ? Cataract   ? left eye mild   ? Chicken pox   ? COVID-19   ? 09/2019/2021 ? month  ? Degenerative disc disease, lumbar   ? L4/5 noted imaging  ? Dyslipidemia   ? Encounter for care of pacemaker 05/05/2019  ? Gallstones   ? GERD (gastroesophageal reflux disease)   ? History of esophageal stricture   ? 05/08/09  ? History of kidney stones   ? Kidney stones   ? LVH (left ventricular hypertrophy)   ? mild on imaging   ? Presence of permanent cardiac pacemaker   ? Solitary kidney   ? left kidney is atrophic   ? ?Past Surgical History:  ?Procedure Laterality Date   ? CATARACT EXTRACTION W/PHACO Right 08/10/2021  ? Procedure: CATARACT EXTRACTION PHACO AND INTRAOCULAR LENS PLACEMENT ([place]) RIGHT TORIC LENS 9.17 00:57.9 ;  Surgeon: Birder Robson, MD;  Location: [facility];  Service: Ophthalmology;  Laterality: Right;  ? CATARACT EXTRACTION W/PHACO Left 08/31/2021  ? Procedure: CATARACT EXTRACTION PHACO AND INTRAOCULAR LENS PLACEMENT ([facility]) LEFT 8.45 00:47.8;  Surgeon: Birder Robson, MD;  Location: [facility];  Service: Ophthalmology;  Laterality: Left;  ? FOOT SURGERY    ? right mortons neuroma   ? HOLEP-LASER ENUCLEATION OF THE PROSTATE WITH MORCELLATION N/A 07/10/2020  ? Procedure: HOLEP-LASER ENUCLEATION OF THE PROSTATE WITH MORCELLATION;  Surgeon: Billey Co, MD;  Location: [facility];  Service: Urology;  Laterality: N/A;  ? PACEMAKER PLACEMENT    ? 2015 Dr. Caryl Comes for AV block  ? PERMANENT PACEMAKER INSERTION N/A 02/05/2014  ? Procedure: PERMANENT PACEMAKER INSERTION;  Surgeon: Deboraha Sprang, MD;  Location: [facility] CATH LAB;  Service: Cardiovascular;  Laterality: N/A;  ? ?Family History  ?Problem Relation Age of Onset  ? Diabetes Mother   ?     died of complications   ? Cancer Father   ?     bladder   ? Stroke Father   ? ?Social History  ? ?Tobacco Use  ? Smoking status: Former  ?  Packs/day: 1.00  ?  Years: 20.00  ?  Pack years: 20.00  ?  Types: Cigarettes  ?  Quit date: 10/21/1991  ?  Years since quitting: 30.3  ? Smokeless tobacco: Never  ? Tobacco comments:  ?  smoked [id number] <1 ppd no FH lung cancer   ?Substance Use Topics  ? Alcohol use: Yes  ?  Alcohol/week: 7.0 standard drinks  ?  Types: 7 Standard drinks or equivalent per week  ?  Comment: Drinks 2-3 ounces of liquor daily  ? ?Marital status: Married ? ?ROS  ? ?Review of Systems  ?Constitutional: Negative for malaise/fatigue and weight gain.  ?Cardiovascular:  Negative for chest pain, claudication, dyspnea on exertion, leg swelling, near-syncope, orthopnea, palpitations, paroxysmal nocturnal  dyspnea and syncope.  ?Respiratory:  Negative for shortness of breath.   ?Hematologic/Lymphatic: Does not bruise/bleed easily.  ?Musculoskeletal:  Positive for joint pain (bilateral knees).  ?Gastrointestinal:  Negative for melena.  ?Neurological:  Negative for dizziness and weakness.  ? ?Objective  ?Blood pressure 132/87, pulse 67, temperature 98 ?F (36.7 ?C), resp. rate 16, height '6\' 1"'$  (1.854 m), weight 208 lb (94.3 kg), SpO2 99 %. Body mass index is 27.44 kg/m?.  ? ? ?  02/21/2022  ?  8:31 AM 02/21/2022  ?  8:28 AM 08/31/2021  ?  1:31 PM  ?Vitals with BMI  ?Height  '6\' 1"'$    ?Weight  208 lbs   ?BMI  27.45   ?Systolic 939 030 092  ?Diastolic 87 92 99  ?Pulse 67 94   ?  ?   ?Physical Exam ?Vitals reviewed.  ?Cardiovascular:  ?   Rate and Rhythm: Normal rate and regular rhythm.  ?   Pulses: Intact distal pulses.  ?   Heart sounds: Normal heart sounds, S1 normal and S2 normal. No murmur heard. ?  No gallop.  ?   Comments: No leg edema, no JVD. ?Pulmonary:  ?   Effort: Pulmonary effort is normal. No respiratory distress.  ?   Breath sounds: Normal breath sounds. No wheezing, rhonchi or rales.  ?Musculoskeletal:  ?   Right lower leg: No edema.  ?   Left lower leg: No edema.  ?Skin: ?   General: Skin is warm and dry.  ?Neurological:  ?   Mental Status: He is alert.  ? ?Laboratory examination:  ? ? ?  Latest Ref Rng & Units 04/26/2021  ?  7:58 AM 05/01/2020  ? 10:19 AM 11/22/2018  ?  8:17 AM  ?CMP  ?Glucose 70 - 99 mg/dL 98   102   108    ?BUN 6 - 23 mg/dL '14   14   17    '$ ?Creatinine 0.40 - 1.50 mg/dL 1.00   0.95   1.11    ?Sodium 135 - 145 mEq/L 141   138   141    ?Potassium 3.5 - 5.1 mEq/L 4.4   4.3   4.3    ?Chloride 96 - 112 mEq/L 105   105   105    ?CO2 19 - 32 mEq/L '29   29   28    '$ ?Calcium 8.4 - 10.5 mg/dL 9.1   9.4   9.2    ?Total Protein 6.0 - 8.3 g/dL 6.7   6.7   6.4    ?Total Bilirubin 0.2 - 1.2 mg/dL 1.0   1.1   0.8    ?Alkaline Phos 39 - 117 U/L 63   66   48    ?AST 0 - 37 U/L '15   18   24    '$ ?ALT 0 - 53 U/L 13  17   30    ? ? ?  Latest Ref Rng & Units 04/26/2021  ?  7:58 AM 05/01/2020  ? 10:19 AM 05/24/2019  ?  3:10 PM  ?CBC  ?WBC 4.0 - 10.5 K/uL 5.0   5.2   5.2    ?Hemoglobin 13.0 - 17.0 g/dL 14.9   14.5   14.7    ?Hematocrit 39.0 - 52.0 % 45.6   42.8   42.4    ?Platelets 150.0 - 400.0 K/uL 166.0   148.0   161    ? ?Lipid Panel  ?   ?Component Value Date/Time  ? CHOL 166 04/26/2021 0758  ? TRIG 122.0 04/26/2021 0758  ? HDL 48.60 04/26/2021 0758  ? CHOLHDL 3 04/26/2021 0758  ? VLDL 24.4 04/26/2021 0758  ? [place] 93 04/26/2021 0758  ? ?HEMOGLOBIN A1C ?Lab Results  ?Component Value Date  ? HGBA1C 5.6 05/11/2018  ? ?TSH ?Recent Labs  ?  04/26/21 ?0758  ?TSH 1.38  ? ?Allergies  ? ?Allergies  ?Allergen Reactions  ? Lipitor [Atorvastatin] Other (See Comments)  ?  myalgias  ? Lisinopril Cough  ?   ?  ? Simvastatin Other (See Comments)  ?  myalgias  ?  ?Medications Prior to Visit:  ? ?Outpatient Medications Prior to Visit  ?Medication Sig Dispense Refill  ? Ascorbic Acid (VITAMIN C) 1000 MG tablet Take 1,000 mg by mouth daily.    ? carvedilol (COREG) 12.5 MG tablet Take 1 tablet (12.5 mg total) by mouth 2 (two) times daily with a meal. 180 tablet 0  ? Cholecalciferol (VITAMIN D3) 125 MCG (5000 UT) TABS Take 5,000 Units by mouth daily.     ? ELIQUIS 5 MG TABS tablet TAKE 1 TABLET(5 MG) BY MOUTH TWICE DAILY 180 tablet 1  ? omeprazole (PRILOSEC) 40 MG capsule TAKE ONE CAPSULE BY MOUTH DAILY, TAKE 30 MINTUES BEFORE BREAKFAST 90 capsule 1  ? pravastatin (PRAVACHOL) 40 MG tablet TAKE 1 TABLET(40 MG) BY MOUTH DAILY AT NIGHT 90 tablet 1  ? Zinc 50 MG CAPS Take 50 mg by mouth daily.     ? Multiple Vitamins-Minerals (PRESERVISION AREDS 2 PO) Take by mouth in the morning and at bedtime.    ? ?No facility-administered medications prior to visit.  ? ?Final Medications at End of Visit   ? ?Current Meds  ?Medication Sig  ? Ascorbic Acid (VITAMIN C) 1000 MG tablet Take 1,000 mg by mouth daily.  ? carvedilol (COREG) 12.5 MG tablet Take 1 tablet  (12.5 mg total) by mouth 2 (two) times daily with a meal.  ? Cholecalciferol (VITAMIN D3) 125 MCG (5000 UT) TABS Take 5,000 Units by mouth daily.   ? ELIQUIS 5 MG TABS tablet TAKE 1 TABLET(5 MG) BY MOUTH TWIC

## 2022-03-02 ENCOUNTER — Other Ambulatory Visit: Payer: Self-pay | Admitting: Cardiology

## 2022-03-02 DIAGNOSIS — I10 Essential (primary) hypertension: Secondary | ICD-10-CM

## 2022-03-02 DIAGNOSIS — I480 Paroxysmal atrial fibrillation: Secondary | ICD-10-CM

## 2022-03-16 ENCOUNTER — Ambulatory Visit: Payer: Self-pay | Admitting: Urology

## 2022-03-24 ENCOUNTER — Ambulatory Visit: Payer: Self-pay | Admitting: Urology

## 2022-03-30 DIAGNOSIS — H353131 Nonexudative age-related macular degeneration, bilateral, early dry stage: Secondary | ICD-10-CM | POA: Diagnosis not present

## 2022-03-31 ENCOUNTER — Telehealth: Payer: Self-pay | Admitting: Internal Medicine

## 2022-03-31 NOTE — Telephone Encounter (Signed)
Copied from [place] [id number]. Topic: Medicare AWV >> Mar 31, 2022 10:38 AM Harris-Coley, Hannah Beat wrote: Reason for CRM: LVM 03/31/22 to r/s AWV appt.  Changed AWV appt date change 04/04/22 '@9'$ :45am.  Please confirm appt changek khc

## 2022-04-01 ENCOUNTER — Ambulatory Visit: Payer: Medicare Other

## 2022-04-04 ENCOUNTER — Ambulatory Visit: Payer: Medicare Other

## 2022-04-08 ENCOUNTER — Other Ambulatory Visit: Payer: Self-pay

## 2022-04-08 ENCOUNTER — Ambulatory Visit (INDEPENDENT_AMBULATORY_CARE_PROVIDER_SITE_OTHER): Payer: Medicare Other

## 2022-04-08 VITALS — Ht 73.0 in | Wt 200.0 lb

## 2022-04-08 DIAGNOSIS — K219 Gastro-esophageal reflux disease without esophagitis: Secondary | ICD-10-CM

## 2022-04-08 DIAGNOSIS — Z45018 Encounter for adjustment and management of other part of cardiac pacemaker: Secondary | ICD-10-CM | POA: Diagnosis not present

## 2022-04-08 DIAGNOSIS — Z0000 Encounter for general adult medical examination without abnormal findings: Secondary | ICD-10-CM | POA: Diagnosis not present

## 2022-04-08 DIAGNOSIS — I442 Atrioventricular block, complete: Secondary | ICD-10-CM | POA: Diagnosis not present

## 2022-04-08 MED ORDER — OMEPRAZOLE 40 MG PO CPDR: DELAYED_RELEASE_CAPSULE | ORAL | 3 refills | Status: AC

## 2022-04-08 NOTE — Telephone Encounter (Signed)
Patient requests refill with omeprazole. Reports taking 2 daily instead of 1 for the last 6 months.  Pended for approval.

## 2022-04-08 NOTE — Progress Notes (Signed)
Subjective:   [name] is a 76 y.o. male who presents for Medicare Annual/Subsequent preventive examination.  Review of Systems    No ROS.  Medicare Wellness Virtual Visit.  Visual/audio telehealth visit, UTA vital signs.   See social history for additional risk factors.   Cardiac Risk Factors include: advanced age (>59mn, >>61women);male gender     Objective:    Today's Vitals   04/08/22 0949  Weight: 200 lb (90.7 kg)  Height: '6\' 1"'$  (1.854 m)   Body mass index is 26.39 kg/m.     04/08/2022    9:53 AM 08/31/2021   11:53 AM 08/10/2021    6:58 AM 08/08/2021   10:29 AM 03/29/2021    2:10 PM 07/10/2020    7:26 AM 07/08/2020   10:55 AM  Advanced Directives  Does Patient Have a Medical Advance Directive? No No No No No No No  Would patient like information on creating a medical advance directive? No - Patient declined No - Patient declined No - Patient declined  No - Patient declined No - Patient declined No - Patient declined    Current Medications (verified) Outpatient Encounter Medications as of 04/08/2022  Medication Sig   Ascorbic Acid (VITAMIN C) 1000 MG tablet Take 1,000 mg by mouth daily.   carvedilol (COREG) 12.5 MG tablet TAKE 1 TABLET(12.5 MG) BY MOUTH TWICE DAILY WITH A MEAL   Cholecalciferol (VITAMIN D3) 125 MCG (5000 UT) TABS Take 5,000 Units by mouth daily.    ELIQUIS 5 MG TABS tablet TAKE 1 TABLET(5 MG) BY MOUTH TWICE DAILY   omeprazole (PRILOSEC) 40 MG capsule TAKE ONE CAPSULE BY MOUTH DAILY, TAKE 30 MINTUES BEFORE BREAKFAST   pravastatin (PRAVACHOL) 40 MG tablet TAKE 1 TABLET(40 MG) BY MOUTH DAILY AT NIGHT   Zinc 50 MG CAPS Take 50 mg by mouth daily.    No facility-administered encounter medications on file as of 04/08/2022.    Allergies (verified) Lipitor [atorvastatin], Lisinopril, and Simvastatin   History: Past Medical History:  Diagnosis Date   Atrophic kidney    left    Cataract    left eye mild    Chicken pox    COVID-19     09/2019/2021 ? month   Degenerative disc disease, lumbar    L4/5 noted imaging   Dyslipidemia    Encounter for care of pacemaker 05/05/2019   Gallstones    GERD (gastroesophageal reflux disease)    History of esophageal stricture    05/08/09   History of kidney stones    Kidney stones    LVH (left ventricular hypertrophy)    mild on imaging    Presence of permanent cardiac pacemaker    Solitary kidney    left kidney is atrophic    Past Surgical History:  Procedure Laterality Date   CATARACT EXTRACTION W/PHACO Right 08/10/2021   Procedure: CATARACT EXTRACTION PHACO AND INTRAOCULAR LENS PLACEMENT ([place] RIGHT TORIC LENS 9.17 00:57.9 ;  Surgeon: PBirder Robson MD;  [facility]  Service: Ophthalmology;  Laterality: Right;   CATARACT EXTRACTION W/PHACO Left 08/31/2021   Procedure: CATARACT EXTRACTION PHACO AND INTRAOCULAR LENS PLACEMENT (IOC) LEFT 8.45 00:47.8;  Surgeon: PBirder Robson MD;  [facility]  Service: Ophthalmology;  Laterality: Left;   FOOT SURGERY     right mortons neuroma    HOLEP-LASER ENUCLEATION OF THE PROSTATE WITH MORCELLATION N/A 07/10/2020   Procedure: HOLEP-LASER ENUCLEATION OF THE PROSTATE WITH MORCELLATION;  Surgeon: SBilley Co MD;  Location:  [facility];  Service: Urology;  Laterality: N/A;   PACEMAKER PLACEMENT     2015 Dr. Caryl Comes for AV block   PERMANENT PACEMAKER INSERTION N/A 02/05/2014   Procedure: PERMANENT PACEMAKER INSERTION;  Surgeon: Deboraha Sprang, MD;  Location: [facility] CATH LAB;  Service: Cardiovascular;  Laterality: N/A;   Family History  Problem Relation Age of Onset   Diabetes Mother        died of complications    Cancer Father        bladder    Stroke Father    Social History   Socioeconomic History   Marital status: Married    Spouse name: Not on file   Number of children: 2   Years of education: Not on file   Highest education level: Not on file  Occupational History   Not on file  Tobacco  Use   Smoking status: Former    Packs/day: 1.00    Years: 20.00    Pack years: 20.00    Types: Cigarettes    Quit date: 10/21/1991    Years since quitting: 30.4   Smokeless tobacco: Never   Tobacco comments:    smoked [id number] <1 ppd no FH lung cancer   Vaping Use   Vaping Use: Never used  Substance and Sexual Activity   Alcohol use: Yes    Alcohol/week: 7.0 standard drinks    Types: 7 Standard drinks or equivalent per week    Comment: Drinks 2-3 ounces of liquor daily   Drug use: No   Sexual activity: Yes    Partners: Female    Birth control/protection: None  Other Topics Concern   Not on file  Social History Narrative   Used to be in the [place]    2 kids (son and daughter)   Married    Born [place] grew up in [place]    Works part time at [employer]    Former smoker 1968 to 1998 < 1ppd no FH lung cancer    Drinks 2-3 small shots of liquor daily, no drugs, never chewed tobacco    Social Determinants of Radio broadcast assistant Strain: Low Risk    Difficulty of Paying Living Expenses: Not hard at all  Food Insecurity: No Food Insecurity   Worried About Charity fundraiser in the Last Year: Never true   Arboriculturist in the Last Year: Never true  Transportation Needs: No Transportation Needs   Lack of Transportation (Medical): No   Lack of Transportation (Non-Medical): No  Physical Activity: Sufficiently Active   Days of Exercise per Week: 5 days   Minutes of Exercise per Session: 30 min  Stress: No Stress Concern Present   Feeling of Stress : Not at all  Social Connections: Unknown   Frequency of Communication with Friends and Family: More than three times a week   Frequency of Social Gatherings with Friends and Family: More than three times a week   Attends Religious Services: Not on file   Active Member of Clubs or Organizations: Not on file   Attends Archivist Meetings: Not on file   Marital Status: Not on file    Tobacco Counseling Counseling  given: Not Answered Tobacco comments: smoked [id number] <1 ppd no FH lung cancer    Clinical Intake:  Pre-visit preparation completed: Yes        Diabetes: No  How often do you need to have someone help you when you read instructions, pamphlets, or other  written materials from your doctor or pharmacy?: 1 - Never    Interpreter Needed?: No      Activities of Daily Living    04/08/2022    9:50 AM 08/31/2021   11:49 AM  In your present state of health, do you have any difficulty performing the following activities:  Hearing? 0 0  Vision? 0 0  Difficulty concentrating or making decisions? 0 0  Walking or climbing stairs? 0 0  Dressing or bathing? 0 0  Doing errands, shopping? 0   Preparing Food and eating ? N   Using the Toilet? N   In the past six months, have you accidently leaked urine? N   Do you have problems with loss of bowel control? N   Managing your Medications? N   Managing your Finances? N   Housekeeping or managing your Housekeeping? N     Patient Care Team: McLean-Scocuzza, Nino Glow, MD as PCP - General (Internal Medicine)  Indicate any recent Medical Services you may have received from other than [facility] providers in the past year (date may be approximate).     Assessment:   This is a routine wellness examination for [name].  Virtual Visit via Telephone Note  I connected with  [name] on 04/08/22 at  9:45 AM EDT by telephone and verified that I am speaking with the correct person using two identifiers.  Persons participating in the virtual visit: patient/Nurse Health Advisor   I discussed the limitations of performing an evaluation and management service by telehealth. We continued and completed visit with audio only. Some vital signs may be absent or patient reported.   Hearing/Vision screen Hearing Screening - Comments:: Patient is able to hear conversational tones without difficulty.  No issues reported. Vision Screening - Comments::  Followed by [facility]  Cataracts extracted, bilateral They have seen their ophthalmologist in the last 12 months  Dietary issues and exercise activities discussed: Current Exercise Habits: Home exercise routine, Type of exercise: walking, Intensity: Mild Regular diet; portion controlled Good water intake   Goals Addressed             This Visit's Progress    Healthy Diet   On track    Portion control meals        Depression Screen    04/08/2022    9:53 AM 05/11/2021    3:09 PM 03/29/2021    2:08 PM 03/26/2020    1:41 PM 03/26/2019    9:19 AM 09/28/2018    8:11 AM 03/23/2018    8:34 AM  PHQ 2/9 Scores  PHQ - 2 Score 0 0 0 0 0 0 0    Fall Risk    04/08/2022    9:53 AM 05/11/2021    3:09 PM 03/29/2021    2:11 PM 05/01/2020    9:52 AM 03/26/2020    1:40 PM  [place] in the past year? 0 0 0 0 0  Number falls in past yr: 0 0 0 0   Injury with Fall?  0 0 0   Follow up Falls evaluation completed Falls evaluation completed Falls evaluation completed Falls evaluation completed Falls evaluation completed    [place]: Home free of loose throw rugs in walkways, pet beds, electrical cords, etc? Yes  Adequate lighting in your home to reduce risk of falls? Yes   ASSISTIVE DEVICES UTILIZED TO PREVENT FALLS: Life alert? No  Use of a cane, walker  or w/c? No   TIMED UP AND GO: Was the test performed? No .   Cognitive Function: Patient is alert and oriented x3.     03/23/2018    9:04 AM  MMSE - Mini Mental State Exam  Orientation to time 5  Orientation to Place 5  Registration 3  Attention/ Calculation 5  Recall 3  Language- name 2 objects 2  Language- repeat 1  Language- follow 3 step command 3  Language- read & follow direction 1  Write a sentence 1  Copy design 1  Total score 30        03/26/2020    1:52 PM 03/26/2019   10:15 AM  6CIT Screen  What Year? 0 points 0 points  What month? 0 points 0 points  What  time? 0 points 0 points  Count back from 20  0 points  Months in reverse  0 points  Repeat phrase  0 points  Total Score  0 points    Immunizations Immunization History  Administered Date(s) Administered   Influenza, High Dose Seasonal PF 09/28/2018, 08/13/2021   Influenza-Unspecified 08/23/2017   PFIZER Comirnaty(Gray Top)Covid-19 Tri-Sucrose Vaccine 06/18/2021   PFIZER(Purple Top)SARS-COV-2 Vaccination 12/28/2019, 01/22/2020, 08/23/2020   Pneumococcal Conjugate-13 10/20/2017   Pneumococcal Polysaccharide-23 11/22/2018    TDAP status: Due, Education has been provided regarding the importance of this vaccine. Advised may receive this vaccine at local pharmacy or Health Dept. Aware to provide a copy of the vaccination record if obtained from local pharmacy or Health Dept. Verbalized acceptance and understanding.   Shingrix Completed?: No.    Education has been provided regarding the importance of this vaccine. Patient has been advised to call insurance company to determine out of pocket expense if they have not yet received this vaccine. Advised may also receive vaccine at local pharmacy or Health Dept. Verbalized acceptance and understanding.  Screening Tests Health Maintenance  Topic Date Due   COVID-19 Vaccine (5 - Booster for Pfizer series) 04/24/2022 (Originally 08/13/2021)   Zoster Vaccines- Shingrix (1 of 2) 07/09/2022 (Originally 07/25/1997)   TETANUS/TDAP  04/09/2023 (Originally 07/25/1966)   INFLUENZA VACCINE  06/14/2022   COLONOSCOPY (Pts 45-80yr Insurance coverage will need to be confirmed)  01/23/2028   Pneumonia Vaccine 75 Years old  Completed   Hepatitis C Screening  Completed   HPV VACCINES  Aged Out   Health Maintenance There are no preventive care reminders to display for this patient.  Lung Cancer Screening: (Low Dose CT Chest recommended if Age 75-80years, 30 pack-year currently smoking OR have quit w/in 15years.) does not qualify.   Vision Screening:  Recommended annual ophthalmology exams for early detection of glaucoma and other disorders of the eye.  Dental Screening: Recommended annual dental exams for proper oral hygiene  Community Resource Referral / Chronic Care Management: CRR required this visit?  No   CCM required this visit?  No      Plan:   Keep all routine maintenance appointments.   I have personally reviewed and noted the following in the patient's chart:   Medical and social history Use of alcohol, tobacco or illicit drugs  Current medications and supplements including opioid prescriptions. Patient is not currently taking opioid prescriptions. Functional ability and status Nutritional status Physical activity Advanced directives List of other physicians Hospitalizations, surgeries, and ER visits in previous 12 months Vitals Screenings to include cognitive, depression, and falls Referrals and appointments  In addition, I have reviewed and discussed with patient certain preventive protocols, quality  metrics, and best practice recommendations. A written personalized care plan for preventive services as well as general preventive health recommendations were provided to patient.     Varney Biles, LPN   03/03/9143

## 2022-04-08 NOTE — Patient Instructions (Addendum)
  [name] , Thank you for taking time to come for your Medicare Wellness Visit. I appreciate your ongoing commitment to your health goals. Please review the following plan we discussed and let me know if I can assist you in the future.   These are the goals we discussed:  Goals      Healthy Diet     Portion control meals         This is a list of the screening recommended for you and due dates:  Health Maintenance  Topic Date Due   COVID-19 Vaccine (5 - Booster for Pfizer series) 04/24/2022*   Zoster (Shingles) Vaccine (1 of 2) 07/09/2022*   Tetanus Vaccine  04/09/2023*   Flu Shot  06/14/2022   Colon Cancer Screening  01/23/2028   Pneumonia Vaccine  Completed   Hepatitis C Screening: USPSTF Recommendation to screen - Ages 18-79 yo.  Completed   HPV Vaccine  Aged Out  *Topic was postponed. The date shown is not the original due date.

## 2022-04-21 ENCOUNTER — Encounter: Payer: Self-pay | Admitting: Urology

## 2022-04-21 ENCOUNTER — Ambulatory Visit (INDEPENDENT_AMBULATORY_CARE_PROVIDER_SITE_OTHER): Payer: Medicare Other | Admitting: Urology

## 2022-04-21 VITALS — BP 118/73 | HR 87 | Ht 73.0 in | Wt 200.0 lb

## 2022-04-21 DIAGNOSIS — R3912 Poor urinary stream: Secondary | ICD-10-CM | POA: Diagnosis not present

## 2022-04-21 DIAGNOSIS — R339 Retention of urine, unspecified: Secondary | ICD-10-CM

## 2022-04-21 DIAGNOSIS — N401 Enlarged prostate with lower urinary tract symptoms: Secondary | ICD-10-CM

## 2022-04-21 LAB — BLADDER SCAN AMB NON-IMAGING

## 2022-04-21 NOTE — Progress Notes (Signed)
   04/21/2022 2:46 PM   [name] September 23, 1946 [id number]  Reason for visit: Follow up BPH status post HOLEP, PSA screening  HPI: I saw [name] back in urology clinic after [facility] on 07/10/2020.  He is a 76 year old male who presented in July 2021 with severe urinary symptoms of weak dribbling stream, urgency, frequency, urge incontinence, and elevated PVRs greater than 1 L in clinic.  Transrectal ultrasound showed a 17 g prostate and cystoscopy showed a distended bladder with severe bladder trabeculations.   He underwent an uncomplicated HoLEP on 5/99/7741 with removal of 13 g of benign prostate tissue.   He has been doing extremely well since surgery.  He denies any incontinence or urgency and is voiding with a strong stream.  PVR is normal at 99 mL.  He denies any urinary complaints today.  PSA June 2022 was normal and stable at 0.29.  RTC 18 months for PVR.  With his severe bladder trabeculations and history of significantly elevated PVRs with overflow incontinence would recommend continuing  PVRs every 1 to 2 years for at least 5 years postop, return precautions discussed  Billey Co, MD  [facility] [address], [place], [place] [address] [phone]

## 2022-05-16 ENCOUNTER — Other Ambulatory Visit (INDEPENDENT_AMBULATORY_CARE_PROVIDER_SITE_OTHER): Payer: Medicare Other

## 2022-05-16 DIAGNOSIS — I10 Essential (primary) hypertension: Secondary | ICD-10-CM

## 2022-05-16 DIAGNOSIS — E785 Hyperlipidemia, unspecified: Secondary | ICD-10-CM

## 2022-05-16 DIAGNOSIS — Z1389 Encounter for screening for other disorder: Secondary | ICD-10-CM

## 2022-05-16 DIAGNOSIS — Z125 Encounter for screening for malignant neoplasm of prostate: Secondary | ICD-10-CM

## 2022-05-16 DIAGNOSIS — Z1329 Encounter for screening for other suspected endocrine disorder: Secondary | ICD-10-CM | POA: Diagnosis not present

## 2022-05-16 LAB — CBC WITH DIFFERENTIAL/PLATELET
Basophils Absolute: 0.1 10*3/uL (ref 0.0–0.1)
Basophils Relative: 1 % (ref 0.0–3.0)
Eosinophils Absolute: 0.2 10*3/uL (ref 0.0–0.7)
Eosinophils Relative: 3 % (ref 0.0–5.0)
HCT: 44.2 % (ref 39.0–52.0)
Hemoglobin: 14.5 g/dL (ref 13.0–17.0)
Lymphocytes Relative: 22.4 % (ref 12.0–46.0)
Lymphs Abs: 1.3 10*3/uL (ref 0.7–4.0)
MCHC: 32.8 g/dL (ref 30.0–36.0)
MCV: 90 fl (ref 78.0–100.0)
Monocytes Absolute: 0.5 10*3/uL (ref 0.1–1.0)
Monocytes Relative: 8 % (ref 3.0–12.0)
Neutro Abs: 3.8 10*3/uL (ref 1.4–7.7)
Neutrophils Relative %: 65.6 % (ref 43.0–77.0)
Platelets: 162 10*3/uL (ref 150.0–400.0)
RBC: 4.91 Mil/uL (ref 4.22–5.81)
RDW: 14.6 % (ref 11.5–15.5)
WBC: 5.9 10*3/uL (ref 4.0–10.5)

## 2022-05-16 LAB — COMPREHENSIVE METABOLIC PANEL WITH GFR
ALT: 15 U/L (ref 0–53)
AST: 17 U/L (ref 0–37)
Albumin: 4.1 g/dL (ref 3.5–5.2)
Alkaline Phosphatase: 74 U/L (ref 39–117)
BUN: 13 mg/dL (ref 6–23)
CO2: 29 meq/L (ref 19–32)
Calcium: 8.9 mg/dL (ref 8.4–10.5)
Chloride: 105 meq/L (ref 96–112)
Creatinine, Ser: 0.93 mg/dL (ref 0.40–1.50)
GFR: 80.72 mL/min
Glucose, Bld: 119 mg/dL — ABNORMAL HIGH (ref 70–99)
Potassium: 4.2 meq/L (ref 3.5–5.1)
Sodium: 140 meq/L (ref 135–145)
Total Bilirubin: 0.6 mg/dL (ref 0.2–1.2)
Total Protein: 6.7 g/dL (ref 6.0–8.3)

## 2022-05-16 LAB — LDL CHOLESTEROL, DIRECT: Direct LDL: 87 mg/dL

## 2022-05-16 LAB — PSA, MEDICARE: PSA: 0.26 ng/mL (ref 0.10–4.00)

## 2022-05-16 LAB — LIPID PANEL
Cholesterol: 167 mg/dL (ref 0–200)
HDL: 42.6 mg/dL
NonHDL: 124.13
Total CHOL/HDL Ratio: 4
Triglycerides: 222 mg/dL — ABNORMAL HIGH (ref 0.0–149.0)
VLDL: 44.4 mg/dL — ABNORMAL HIGH (ref 0.0–40.0)

## 2022-05-16 LAB — TSH: TSH: 1.87 u[IU]/mL (ref 0.35–5.50)

## 2022-05-17 LAB — URINALYSIS, ROUTINE W REFLEX MICROSCOPIC
Bilirubin Urine: NEGATIVE
Glucose, UA: NEGATIVE
Ketones, ur: NEGATIVE
Nitrite: POSITIVE — AB
Specific Gravity, Urine: 1.014 (ref 1.001–1.035)
Squamous Epithelial / HPF: NONE SEEN /HPF (ref <=5)
pH: 6 (ref 5.0–8.0)

## 2022-05-17 LAB — MICROSCOPIC MESSAGE

## 2022-05-18 ENCOUNTER — Other Ambulatory Visit: Payer: Self-pay

## 2022-05-18 DIAGNOSIS — Z1389 Encounter for screening for other disorder: Secondary | ICD-10-CM

## 2022-05-18 NOTE — Progress Notes (Signed)
Urine cx ordered for tomorrow 05/19/22

## 2022-05-19 ENCOUNTER — Ambulatory Visit (INDEPENDENT_AMBULATORY_CARE_PROVIDER_SITE_OTHER): Payer: Medicare Other | Admitting: Internal Medicine

## 2022-05-19 ENCOUNTER — Encounter: Payer: Self-pay | Admitting: Internal Medicine

## 2022-05-19 VITALS — BP 120/70 | HR 65 | Temp 98.1°F | Resp 20 | Ht 73.0 in | Wt 204.8 lb

## 2022-05-19 DIAGNOSIS — R739 Hyperglycemia, unspecified: Secondary | ICD-10-CM | POA: Diagnosis not present

## 2022-05-19 DIAGNOSIS — Z1389 Encounter for screening for other disorder: Secondary | ICD-10-CM | POA: Diagnosis not present

## 2022-05-19 DIAGNOSIS — I480 Paroxysmal atrial fibrillation: Secondary | ICD-10-CM | POA: Diagnosis not present

## 2022-05-19 DIAGNOSIS — H353191 Nonexudative age-related macular degeneration, unspecified eye, early dry stage: Secondary | ICD-10-CM

## 2022-05-19 DIAGNOSIS — I10 Essential (primary) hypertension: Secondary | ICD-10-CM

## 2022-05-19 DIAGNOSIS — R319 Hematuria, unspecified: Secondary | ICD-10-CM | POA: Diagnosis not present

## 2022-05-19 DIAGNOSIS — I2510 Atherosclerotic heart disease of native coronary artery without angina pectoris: Secondary | ICD-10-CM | POA: Diagnosis not present

## 2022-05-19 MED ORDER — CARVEDILOL 12.5 MG PO TABS: ORAL_TABLET | ORAL | 3 refills | Status: AC

## 2022-05-19 MED ORDER — APIXABAN 5 MG PO TABS: ORAL_TABLET | ORAL | 3 refills | Status: DC

## 2022-05-19 MED ORDER — PRAVASTATIN SODIUM 40 MG PO TABS: ORAL_TABLET | ORAL | 3 refills | Status: DC

## 2022-05-19 NOTE — Patient Instructions (Signed)
Dr. Volanda Napoleon

## 2022-05-19 NOTE — Progress Notes (Signed)
Chief Complaint  Patient presents with   Follow-up    Complete Physical Exam   F/u  1. Htn controlled and afib in NSR on eliquid 5 mg bid coreg 12.5 mg bid,hld sl elevated trigs on pravachol 40 mg qhs  F/u cards yearly   Review of Systems  Constitutional:  Negative for weight loss.  HENT:  Negative for hearing loss.   Eyes:  Negative for blurred vision.  Respiratory:  Negative for shortness of breath.   Cardiovascular:  Negative for chest pain.  Gastrointestinal:  Negative for abdominal pain and blood in stool.  Musculoskeletal:  Negative for back pain.  Skin:  Negative for rash.  Neurological:  Negative for headaches.  Psychiatric/Behavioral:  Negative for depression.    Past Medical History:  Diagnosis Date   Atrophic kidney    left    Cataract    left eye mild    Chicken pox    COVID-19    09/2019/2021 ? month   Degenerative disc disease, lumbar    L4/5 noted imaging   Dyslipidemia    Encounter for care of pacemaker 05/05/2019   Gallstones    GERD (gastroesophageal reflux disease)    History of esophageal stricture    05/08/09   History of kidney stones    Kidney stones    LVH (left ventricular hypertrophy)    mild on imaging    Macular degeneration    Presence of permanent cardiac pacemaker    Solitary kidney    left kidney is atrophic    Past Surgical History:  Procedure Laterality Date   CATARACT EXTRACTION W/PHACO Right 08/10/2021   Procedure: CATARACT EXTRACTION PHACO AND INTRAOCULAR LENS PLACEMENT ([place]) RIGHT TORIC LENS 9.17 00:57.9 ;  Surgeon: Birder Robson, MD;  Location: [place];  Service: Ophthalmology;  Laterality: Right;   CATARACT EXTRACTION W/PHACO Left 08/31/2021   Procedure: CATARACT EXTRACTION PHACO AND INTRAOCULAR LENS PLACEMENT ([facility]) LEFT 8.45 00:47.8;  Surgeon: Birder Robson, MD;  Location: [place];  Service: Ophthalmology;  Laterality: Left;   FOOT SURGERY     right mortons neuroma    HOLEP-LASER ENUCLEATION  OF THE PROSTATE WITH MORCELLATION N/A 07/10/2020   Procedure: HOLEP-LASER ENUCLEATION OF THE PROSTATE WITH MORCELLATION;  Surgeon: Billey Co, MD;  Location: [facility];  Service: Urology;  Laterality: N/A;   PACEMAKER PLACEMENT     2015 Dr. Caryl Comes for AV block   PERMANENT PACEMAKER INSERTION N/A 02/05/2014   Procedure: PERMANENT PACEMAKER INSERTION;  Surgeon: Deboraha Sprang, MD;  Location: [facility] CATH LAB;  Service: Cardiovascular;  Laterality: N/A;   Family History  Problem Relation Age of Onset   Diabetes Mother        died of complications    Cancer Father        bladder    Stroke Father    Social History   Socioeconomic History   Marital status: Married    Spouse name: Not on file   Number of children: 2   Years of education: Not on file   Highest education level: Not on file  Occupational History   Not on file  Tobacco Use   Smoking status: Former    Packs/day: 1.00    Years: 20.00    Total pack years: 20.00    Types: Cigarettes    Quit date: 10/21/1991    Years since quitting: 30.5   Smokeless tobacco: Never   Tobacco comments:    smoked [id number] <1 ppd no FH lung cancer  Vaping Use   Vaping Use: Never used  Substance and Sexual Activity   Alcohol use: Yes    Alcohol/week: 7.0 standard drinks of alcohol    Types: 7 Standard drinks or equivalent per week    Comment: Drinks 2-3 ounces of liquor daily   Drug use: No   Sexual activity: Yes    Partners: Female    Birth control/protection: None  Other Topics Concern   Not on file  Social History Narrative   Used to be in the [place]    2 kids (son and daughter)   Married    Born [place] grew up in [place]    Works part time at [employer]    Former smoker 1968 to 1998 < 1ppd no FH lung cancer    Drinks 2-3 small shots of liquor daily, no drugs, never chewed tobacco    Social Determinants of Radio broadcast assistant Strain: Low Risk  (04/08/2022)   Overall Financial Resource Strain (CARDIA)    Difficulty of  Paying Living Expenses: Not hard at all  Food Insecurity: No Food Insecurity (04/08/2022)   Hunger Vital Sign    Worried About Running Out of Food in the Last Year: Never true    [place] in the Last Year: Never true  Transportation Needs: No Transportation Needs (04/08/2022)   PRAPARE - Hydrologist (Medical): No    Lack of Transportation (Non-Medical): No  Physical Activity: Sufficiently Active (04/08/2022)   Exercise Vital Sign    Days of Exercise per Week: 5 days    Minutes of Exercise per Session: 30 min  Stress: No Stress Concern Present (04/08/2022)   [place]    Feeling of Stress : Not at all  Social Connections: Unknown (04/08/2022)   Social Connection and Isolation Panel [NHANES]    Frequency of Communication with Friends and Family: More than three times a week    Frequency of Social Gatherings with Friends and Family: More than three times a week    Attends Religious Services: Not on file    Active Member of Clubs or Organizations: Not on file    Attends Archivist Meetings: Not on file    Marital Status: Not on file  Intimate Partner Violence: Not At Risk (04/08/2022)   Humiliation, Afraid, Rape, and Kick questionnaire    Fear of Current or Ex-Partner: No    Emotionally Abused: No    Physically Abused: No    Sexually Abused: No   Current Meds  Medication Sig   Ascorbic Acid (VITAMIN C) 1000 MG tablet Take 1,000 mg by mouth daily.   Cholecalciferol (VITAMIN D3) 125 MCG (5000 UT) TABS Take 5,000 Units by mouth daily.    Multiple Vitamins-Minerals (PRESERVISION AREDS 2 PO) Take by mouth in the morning and at bedtime.   omeprazole (PRILOSEC) 40 MG capsule TAKE ONE CAPSULE BY MOUTH DAILY, TAKE 30 MINTUES BEFORE BREAKFAST   Zinc 50 MG CAPS Take 50 mg by mouth daily.    [DISCONTINUED] carvedilol (COREG) 12.5 MG tablet TAKE 1 TABLET(12.5 MG) BY MOUTH TWICE DAILY WITH A  MEAL   [DISCONTINUED] ELIQUIS 5 MG TABS tablet TAKE 1 TABLET(5 MG) BY MOUTH TWICE DAILY   [DISCONTINUED] pravastatin (PRAVACHOL) 40 MG tablet TAKE 1 TABLET(40 MG) BY MOUTH DAILY AT NIGHT   Allergies  Allergen Reactions   Lipitor [Atorvastatin] Other (See Comments)    myalgias   Lisinopril Cough  Simvastatin Other (See Comments)    myalgias   Recent Results (from the past 2160 hour(s))  Bladder Scan (Post Void Residual) in office     Status: None   Collection Time: 04/21/22  2:41 PM  Result Value Ref Range   Scan Result 9m   Urinalysis, Routine w reflex microscopic     Status: Abnormal   Collection Time: 05/16/22  8:19 AM  Result Value Ref Range   Color, Urine YELLOW YELLOW   APPearance TURBID (A) CLEAR   Specific Gravity, Urine 1.014 1.001 - 1.035   pH 6.0 5.0 - 8.0   Glucose, UA NEGATIVE NEGATIVE   Bilirubin Urine NEGATIVE NEGATIVE   Ketones, ur NEGATIVE NEGATIVE   Hgb urine dipstick 1+ (A) NEGATIVE   Protein, ur 1+ (A) NEGATIVE   Nitrite POSITIVE (A) NEGATIVE   Leukocytes,Ua 3+ (A) NEGATIVE   WBC, UA PACKED (A) 0 - 5 /HPF   RBC / HPF 10-20 (A) 0 - 2 /HPF   Squamous Epithelial / LPF NONE SEEN < OR = 5 /HPF   Bacteria, UA MANY (A) NONE SEEN /HPF   Hyaline Cast 0-5 (A) NONE SEEN /LPF   Yeast FEW (A) NONE SEEN /HPF  TSH     Status: None   Collection Time: 05/16/22  8:19 AM  Result Value Ref Range   TSH 1.87 0.35 - 5.50 uIU/mL  Lipid panel     Status: Abnormal   Collection Time: 05/16/22  8:19 AM  Result Value Ref Range   Cholesterol 167 0 - 200 mg/dL    Comment: ATP III Classification       Desirable:  < 200 mg/dL               Borderline High:  200 - 239 mg/dL          High:  > = 240 mg/dL   Triglycerides 222.0 (H) 0.0 - 149.0 mg/dL    Comment: Normal:  <150 mg/dLBorderline High:  150 - 199 mg/dL   HDL 42.60 >39.00 mg/dL   VLDL 44.4 (H) 0.0 - 40.0 mg/dL   Total CHOL/HDL Ratio 4     Comment:                Men          Women1/2 Average Risk     3.4           3.3Average Risk          5.0          4.42X Average Risk          9.6          7.13X Average Risk          15.0          11.0                       NonHDL 124.13     Comment: NOTE:  Non-HDL goal should be 30 mg/dL higher than patient's LDL goal (i.e. LDL goal of < 70 mg/dL, would have non-HDL goal of < 100 mg/dL)  Comp Met (CMET)     Status: Abnormal   Collection Time: 05/16/22  8:19 AM  Result Value Ref Range   Sodium 140 135 - 145 mEq/L   Potassium 4.2 3.5 - 5.1 mEq/L   Chloride 105 96 - 112 mEq/L   CO2 29 19 - 32 mEq/L   Glucose, Bld 119 (H) 70 - 99  mg/dL   BUN 13 6 - 23 mg/dL   Creatinine, Ser 0.93 0.40 - 1.50 mg/dL   Total Bilirubin 0.6 0.2 - 1.2 mg/dL   Alkaline Phosphatase 74 39 - 117 U/L   AST 17 0 - 37 U/L   ALT 15 0 - 53 U/L   Total Protein 6.7 6.0 - 8.3 g/dL   Albumin 4.1 3.5 - 5.2 g/dL   GFR 80.72 >60.00 mL/min    Comment: Calculated using the CKD-EPI Creatinine Equation (2021)   Calcium 8.9 8.4 - 10.5 mg/dL  CBC w/Diff     Status: None   Collection Time: 05/16/22  8:19 AM  Result Value Ref Range   WBC 5.9 4.0 - 10.5 K/uL   RBC 4.91 4.22 - 5.81 Mil/uL   Hemoglobin 14.5 13.0 - 17.0 g/dL   HCT 44.2 39.0 - 52.0 %   MCV 90.0 78.0 - 100.0 fl   MCHC 32.8 30.0 - 36.0 g/dL   RDW 14.6 11.5 - 15.5 %   Platelets 162.0 150.0 - 400.0 K/uL   Neutrophils Relative % 65.6 43.0 - 77.0 %   Lymphocytes Relative 22.4 12.0 - 46.0 %   Monocytes Relative 8.0 3.0 - 12.0 %   Eosinophils Relative 3.0 0.0 - 5.0 %   Basophils Relative 1.0 0.0 - 3.0 %   Neutro Abs 3.8 1.4 - 7.7 K/uL   Lymphs Abs 1.3 0.7 - 4.0 K/uL   Monocytes Absolute 0.5 0.1 - 1.0 K/uL   Eosinophils Absolute 0.2 0.0 - 0.7 K/uL   Basophils Absolute 0.1 0.0 - 0.1 K/uL  PSA, Medicare ( [place] only)     Status: None   Collection Time: 05/16/22  8:19 AM  Result Value Ref Range   PSA 0.26 0.10 - 4.00 ng/ml    Comment: Test performed using Access Hybritech PSA Assay, a parmagnetic partical, chemiluminecent  immunoassay.  LDL cholesterol, direct     Status: None   Collection Time: 05/16/22  8:19 AM  Result Value Ref Range   Direct LDL 87.0 mg/dL    Comment: Optimal:  <100 mg/dLNear or Above Optimal:  100-129 mg/dLBorderline High:  130-159 mg/dLHigh:  160-189 mg/dLVery High:  >190 mg/dL  MICROSCOPIC MESSAGE     Status: None   Collection Time: 05/16/22  8:19 AM  Result Value Ref Range   Note      Comment: This urine was analyzed for the presence of WBC,  RBC, bacteria, casts, and other formed elements.  Only those elements seen were reported. . .    Objective  Body mass index is 27.02 kg/m. Wt Readings from Last 3 Encounters:  05/19/22 204 lb 12.8 oz (92.9 kg)  04/21/22 200 lb (90.7 kg)  04/08/22 200 lb (90.7 kg)   Temp Readings from Last 3 Encounters:  05/19/22 98.1 F (36.7 C) (Oral)  02/21/22 98 F (36.7 C)  08/31/21 (!) 97.5 F (36.4 C)   BP Readings from Last 3 Encounters:  05/19/22 120/70  04/21/22 118/73  02/21/22 132/87   Pulse Readings from Last 3 Encounters:  05/19/22 65  04/21/22 87  02/21/22 67    Physical Exam Vitals and nursing note reviewed.  Constitutional:      Appearance: Normal appearance. He is well-developed and well-groomed.  HENT:     Head: Normocephalic and atraumatic.  Eyes:     Conjunctiva/sclera: Conjunctivae normal.     Pupils: Pupils are equal, round, and reactive to light.  Cardiovascular:     Rate and Rhythm: Normal rate  and regular rhythm.     Heart sounds: Normal heart sounds.  Pulmonary:     Effort: Pulmonary effort is normal. No respiratory distress.     Breath sounds: Normal breath sounds.  Abdominal:     Tenderness: There is no abdominal tenderness.  Skin:    General: Skin is warm and moist.  Neurological:     General: No focal deficit present.     Mental Status: He is alert and oriented to person, place, and time. Mental status is at baseline.     Sensory: Sensation is intact.     Motor: Motor function is intact.      Coordination: Coordination is intact.     Gait: Gait is intact. Gait normal.  Psychiatric:        Attention and Perception: Attention and perception normal.        Mood and Affect: Mood and affect normal.        Speech: Speech normal.        Behavior: Behavior normal. Behavior is cooperative.        Thought Content: Thought content normal.        Cognition and Memory: Cognition and memory normal.        Judgment: Judgment normal.     Assessment  Plan  Essential hypertension - Plan: carvedilol (COREG) 12.5 MG tablet bid  Controlled   Hld/Coronary artery disease involving native coronary artery of native heart without angina pectoris - Plan: pravastatin (PRAVACHOL) 40 MG tablet  Paroxysmal atrial fibrillation (HCC) - Plan: carvedilol (COREG) 12.5 MG tablet bid, apixaban (ELIQUIS) 5 MG TABS tablet bid   Hyperglycemia  Declines A1c  HM Flu shot and prevnar utd pna 23 utd rec pt had Tdap (pt declined prev) and shingrix given Rx prev but disc consider get at [facility] or pharmacy .  Declines MMR testing Pfizer x 3 utd ? Date will bring card back in   Consider hep C testing in future will disc with pt    PSA nl 0.26  declines DRE rechecked 04/2021 normal est urology Dr. Diamantina [place]    Dr. Watt Climes screening colonoscopy [facility]  -diverticulosis repeat in 10 years 01/22/18 Pt declines further    -pt saw dermatology Dr. Nehemiah Massed 05/01/18 MM in situ left medial chest infraclavicular no lymphadenopathy rec f/u q3 months for 1 year then q4 months for a year and q6 months x 2 years  -derm f/u upcoming 05/2019 -as of 05/01/20 and 05/11/21 rec pt call above for appt f/u derm/  Declines derm as of 05/19/22   Considered CT chest repeat h/o smoking and CT chest in the past mild changes COPD and chronic bronchitis (of note calculated risk and low risk) also consider repeat CT ab/pelvis with and w/o contrast or MRI (reasons CT 2015 c/w calcified granuloma w/in liver and nonspecific foci w/in liver 7-10 mm and  rec f/u also noted gallstones, kidney stones, left atrophic kidney) -pt declined both of these on 03/23/18     Reviewed cardiology notes 04/02/18 Dr. Einar Gip f/u heart block dual chamber pacemaker 02/05/14 rec add zetia f/u in 1 year appt sch 4 or 03/2022  Dr. Diamantina [place] f/u 03/2021 s/p HOLEP f/u in 1 year   Eye MD macular degeneration on areds Dr. Merla Riches   Provider: Dr. Olivia Mackie McLean-Scocuzza-Internal Medicine

## 2022-05-22 LAB — URINE CULTURE
MICRO NUMBER:: 13612210
SPECIMEN QUALITY:: ADEQUATE

## 2022-05-23 ENCOUNTER — Other Ambulatory Visit: Payer: Self-pay | Admitting: Internal Medicine

## 2022-05-23 DIAGNOSIS — N3001 Acute cystitis with hematuria: Secondary | ICD-10-CM

## 2022-05-23 MED ORDER — CIPROFLOXACIN HCL 500 MG PO TABS: 500.0000 mg | ORAL_TABLET | Freq: Two times a day (BID) | ORAL | 0 refills | Status: AC

## 2022-05-29 ENCOUNTER — Other Ambulatory Visit: Payer: Self-pay | Admitting: Cardiology

## 2022-05-29 DIAGNOSIS — I10 Essential (primary) hypertension: Secondary | ICD-10-CM

## 2022-05-29 DIAGNOSIS — I480 Paroxysmal atrial fibrillation: Secondary | ICD-10-CM

## 2022-07-08 DIAGNOSIS — I442 Atrioventricular block, complete: Secondary | ICD-10-CM | POA: Diagnosis not present

## 2022-07-08 DIAGNOSIS — Z45018 Encounter for adjustment and management of other part of cardiac pacemaker: Secondary | ICD-10-CM | POA: Diagnosis not present

## 2022-07-17 ENCOUNTER — Other Ambulatory Visit: Payer: Self-pay | Admitting: Internal Medicine

## 2022-07-17 DIAGNOSIS — I2510 Atherosclerotic heart disease of native coronary artery without angina pectoris: Secondary | ICD-10-CM

## 2022-08-31 ENCOUNTER — Telehealth (INDEPENDENT_AMBULATORY_CARE_PROVIDER_SITE_OTHER): Payer: Medicare Other | Admitting: Family Medicine

## 2022-08-31 ENCOUNTER — Encounter: Payer: Self-pay | Admitting: Family Medicine

## 2022-08-31 VITALS — Ht 73.0 in | Wt 204.0 lb

## 2022-08-31 DIAGNOSIS — U071 COVID-19: Secondary | ICD-10-CM | POA: Insufficient documentation

## 2022-08-31 MED ORDER — BENZONATATE 200 MG PO CAPS: 200.0000 mg | ORAL_CAPSULE | Freq: Two times a day (BID) | ORAL | 0 refills | Status: DC | PRN

## 2022-08-31 MED ORDER — MOLNUPIRAVIR EUA 200MG CAPSULE: 4.0000 | ORAL_CAPSULE | Freq: Two times a day (BID) | ORAL | 0 refills | Status: AC

## 2022-08-31 NOTE — Progress Notes (Signed)
Virtual Visit via telephone Note  This visit type was conducted due to national recommendations for restrictions regarding the COVID-19 pandemic (e.g. social distancing).  This format is felt to be most appropriate for this patient at this time.  All issues noted in this document were discussed and addressed.  No physical exam was performed (except for noted visual exam findings with Video Visits).   I connected with [name] today at  4:30 PM EDT by a video enabled telemedicine application or telephone and verified that I am speaking with the correct person using two identifiers. Location patient: home Location provider: work  Persons participating in the virtual visit: patient, provider  I discussed the limitations, risks, security and privacy concerns of performing an evaluation and management service by telephone and the availability of in person appointments. I also discussed with the patient that there may be a patient responsible charge related to this service. The patient expressed understanding and agreed to proceed.  Interactive audio and video telecommunications were attempted between this provider and patient, however failed, due to patient having technical difficulties OR patient did not have access to video capability.  We continued and completed visit with audio only.   Reason for visit: COVID-19  HPI: Patient notes onset of symptoms on 08/27/2022.  He has had cough, congestion, rhinorrhea, and generalized weakness.  He had some postnasal drip and sore throat.  No fever or shortness of breath.  No known COVID exposures.  Has been using NyQuil to help with his congestion.  He had a positive COVID test on 10/17.  He notes his wife has COVID as well.   ROS: See pertinent positives and negatives per HPI.  Past Medical History:  Diagnosis Date   Atrophic kidney    left    Cataract    left eye mild    Chicken pox    COVID-19    09/2019/2021 ? month   Degenerative disc  disease, lumbar    L4/5 noted imaging   Dyslipidemia    Encounter for care of pacemaker 05/05/2019   Gallstones    GERD (gastroesophageal reflux disease)    History of esophageal stricture    05/08/09   History of kidney stones    Kidney stones    LVH (left ventricular hypertrophy)    mild on imaging    Macular degeneration    Presence of permanent cardiac pacemaker    Solitary kidney    left kidney is atrophic     Past Surgical History:  Procedure Laterality Date   CATARACT EXTRACTION W/PHACO Right 08/10/2021   Procedure: CATARACT EXTRACTION PHACO AND INTRAOCULAR LENS PLACEMENT ([place]) RIGHT TORIC LENS 9.17 00:57.9 ;  Surgeon: Birder Robson, MD;  Location: [place];  Service: Ophthalmology;  Laterality: Right;   CATARACT EXTRACTION W/PHACO Left 08/31/2021   Procedure: CATARACT EXTRACTION PHACO AND INTRAOCULAR LENS PLACEMENT ([facility]) LEFT 8.45 00:47.8;  Surgeon: Birder Robson, MD;  Location: [place];  Service: Ophthalmology;  Laterality: Left;   FOOT SURGERY     right mortons neuroma    HOLEP-LASER ENUCLEATION OF THE PROSTATE WITH MORCELLATION N/A 07/10/2020   Procedure: HOLEP-LASER ENUCLEATION OF THE PROSTATE WITH MORCELLATION;  Surgeon: Billey Co, MD;  Location: [facility];  Service: Urology;  Laterality: N/A;   PACEMAKER PLACEMENT     2015 Dr. Caryl Comes for AV block   PERMANENT PACEMAKER INSERTION N/A 02/05/2014   Procedure: PERMANENT PACEMAKER INSERTION;  Surgeon: Deboraha Sprang, MD;  Location: [facility] CATH LAB;  Service: Cardiovascular;  Laterality: N/A;    Family History  Problem Relation Age of Onset   Diabetes Mother        died of complications    Cancer Father        bladder    Stroke Father     SOCIAL HX: Former smoker   Current Outpatient Medications:    apixaban (ELIQUIS) 5 MG TABS tablet, TAKE 1 TABLET(5 MG) BY MOUTH TWICE DAILY, Disp: 180 tablet, Rfl: 3   Ascorbic Acid (VITAMIN C) 1000 MG tablet, Take 1,000 mg by mouth daily., Disp: ,  Rfl:    benzonatate (TESSALON) 200 MG capsule, Take 1 capsule (200 mg total) by mouth 2 (two) times daily as needed for cough., Disp: 20 capsule, Rfl: 0   carvedilol (COREG) 12.5 MG tablet, TAKE 1 TABLET(12.5 MG) BY MOUTH TWICE DAILY WITH A MEAL, Disp: 180 tablet, Rfl: 3   Cholecalciferol (VITAMIN D3) 125 MCG (5000 UT) TABS, Take 5,000 Units by mouth daily. , Disp: , Rfl:    molnupiravir EUA (LAGEVRIO) 200 mg CAPS capsule, Take 4 capsules (800 mg total) by mouth 2 (two) times daily for 5 days., Disp: 40 capsule, Rfl: 0   Multiple Vitamins-Minerals (PRESERVISION AREDS 2 PO), Take by mouth in the morning and at bedtime., Disp: , Rfl:    omeprazole (PRILOSEC) 40 MG capsule, TAKE ONE CAPSULE BY MOUTH DAILY, TAKE 30 MINTUES BEFORE BREAKFAST, Disp: 90 capsule, Rfl: 3   pravastatin (PRAVACHOL) 40 MG tablet, TAKE 1 TABLET(40 MG) BY MOUTH DAILY AT NIGHT, Disp: 90 tablet, Rfl: 3   Zinc 50 MG CAPS, Take 50 mg by mouth daily. , Disp: , Rfl:   EXAM: This was a telephone visit and thus no exam was completed.  ASSESSMENT AND PLAN:  Discussed the following assessment and plan:  Problem List Items Addressed This Visit     COVID - Primary    Patient has COVID-19.  Discussed treatment with molnupiravir given given that he is on Eliquis and that would preclude the use of Paxlovid.  Discussed that there could be some rebound infection after coming off of molnupiravir.  Discussed the purpose of molnupiravir is to reduce the risk of severe COVID with death or hospitalization.  Discussed he may start feeling better with taking the molnupiravir.  Advised to seek medical attention for shortness of breath, cough productive of blood, or fever of 103 F or higher.  Advised to remain quarantined the entire time he is on the molnupiravir and once he finishes it he can come off of quarantine and wear a mask through 09/06/2022.  If he is not feeling better when he finishes the molnupiravir he will remain quarantine through 24  October.  Discussed potential side effects of diarrhea, nausea, and dizziness with the molnupiravir.      Relevant Medications   benzonatate (TESSALON) 200 MG capsule   molnupiravir EUA (LAGEVRIO) 200 mg CAPS capsule    No follow-ups on file.   I discussed the assessment and treatment plan with the patient. The patient was provided an opportunity to ask questions and all were answered. The patient agreed with the plan and demonstrated an understanding of the instructions.   The patient was advised to call back or seek an in-person evaluation if the symptoms worsen or if the condition fails to improve as anticipated.  I provided 10 minutes of non-face-to-face time during this encounter.   Tommi Rumps, MD

## 2022-08-31 NOTE — Assessment & Plan Note (Signed)
Patient has COVID-19.  Discussed treatment with molnupiravir given given that he is on Eliquis and that would preclude the use of Paxlovid.  Discussed that there could be some rebound infection after coming off of molnupiravir.  Discussed the purpose of molnupiravir is to reduce the risk of severe COVID with death or hospitalization.  Discussed he may start feeling better with taking the molnupiravir.  Advised to seek medical attention for shortness of breath, cough productive of blood, or fever of 103 F or higher.  Advised to remain quarantined the entire time he is on the molnupiravir and once he finishes it he can come off of quarantine and wear a mask through 09/06/2022.  If he is not feeling better when he finishes the molnupiravir he will remain quarantine through 24 October.  Discussed potential side effects of diarrhea, nausea, and dizziness with the molnupiravir.

## 2022-09-01 ENCOUNTER — Encounter: Payer: Medicare Other | Admitting: Family Medicine

## 2022-09-27 NOTE — Progress Notes (Unsigned)
Chief Complaint  Patient presents with   Pacemaker Check   Encounter for care of pacemaker  Pacemaker [employer] Jude PM  2240 Dual chamber pacemaker implant  02/05/2014  Atrioventricular block, complete (HCC)  Scheduled  In office pacemaker check 09/27/22  Single (S)/Dual (D)/BV: ***. Presenting ***. Pacemaker dependant:  ***. Underlying ***. AP ***%, VP ***%. BP ***%. AMS Episodes ***.  AT/AF burden ***% . Longest ***. Latest ***. HVR ***. Longest ***. Latest ***. Longevity *** Years. Magnet rate: >85%. Lead measurements: Stable. Thoracic impedance: ***. Histogram: Low (L)/normal (N)/high (H)  ***. Patient activity ***.   Observations: ***. Changes: ***.

## 2022-09-28 ENCOUNTER — Ambulatory Visit: Payer: Medicare Other | Admitting: Cardiology

## 2022-09-28 DIAGNOSIS — I442 Atrioventricular block, complete: Secondary | ICD-10-CM

## 2022-09-28 DIAGNOSIS — I480 Paroxysmal atrial fibrillation: Secondary | ICD-10-CM

## 2022-09-28 DIAGNOSIS — Z45018 Encounter for adjustment and management of other part of cardiac pacemaker: Secondary | ICD-10-CM | POA: Diagnosis not present

## 2022-09-28 DIAGNOSIS — Z950 Presence of cardiac pacemaker: Secondary | ICD-10-CM

## 2022-09-29 DIAGNOSIS — H353131 Nonexudative age-related macular degeneration, bilateral, early dry stage: Secondary | ICD-10-CM | POA: Diagnosis not present

## 2022-10-07 DIAGNOSIS — I442 Atrioventricular block, complete: Secondary | ICD-10-CM | POA: Diagnosis not present

## 2022-10-07 DIAGNOSIS — Z45018 Encounter for adjustment and management of other part of cardiac pacemaker: Secondary | ICD-10-CM | POA: Diagnosis not present

## 2022-10-10 ENCOUNTER — Encounter: Payer: Medicare Other | Admitting: Family Medicine

## 2022-10-11 ENCOUNTER — Encounter: Payer: Medicare Other | Admitting: Family Medicine

## 2022-10-12 ENCOUNTER — Ambulatory Visit: Payer: Medicare Other | Admitting: Cardiology

## 2022-10-12 ENCOUNTER — Encounter: Payer: Self-pay | Admitting: Cardiology

## 2022-10-12 VITALS — BP 122/76 | HR 72 | Ht 73.0 in | Wt 208.2 lb

## 2022-10-12 DIAGNOSIS — I442 Atrioventricular block, complete: Secondary | ICD-10-CM

## 2022-10-12 DIAGNOSIS — I4819 Other persistent atrial fibrillation: Secondary | ICD-10-CM

## 2022-10-12 DIAGNOSIS — Z950 Presence of cardiac pacemaker: Secondary | ICD-10-CM

## 2022-10-12 DIAGNOSIS — I2510 Atherosclerotic heart disease of native coronary artery without angina pectoris: Secondary | ICD-10-CM | POA: Diagnosis not present

## 2022-10-12 NOTE — H&P (View-Only) (Signed)
Primary Physician/Referring:  McLean-Scocuzza, Nino Glow, MD  Patient ID: [name], male    DOB: 03/18/1946, 76 y.o.   MRN: [id number]  Chief Complaint  Patient presents with   Follow-up   Hypertension   Atrial Fibrillation    HPI: [name]  is a 76 y.o. male male with hypertension, hyperlipidemia, coronary calcification by CT chest in 2015, unilateral kidney due to congenital absence of left kidney, complete heart block S/P St. Jude dual chamber pacemaker implant  02/05/2014, paroxysmal atrial fibrillation noted on pacemaker transmission, has been on Eliquis since 06/24/2019. He has history of known tolerance to statins, however on low dose of pravastatin.  He has had persistent atrial fibrillation since 09/22/2022, was seen in the office on 09/29/2022 which confirmed persistent atrial fibrillation on the pacemaker interrogation.  Normal functioning pacemaker.  He has noticed decreased exercise tolerance since that.  No leg edema, no PND or orthopnea.  Denies chest pain or palpitations or syncope.  Past Medical History:  Diagnosis Date   Atrophic kidney    left    Cataract    left eye mild    Chicken pox    COVID-19    09/2019/2021 ? month   Degenerative disc disease, lumbar    L4/5 noted imaging   Dyslipidemia    Encounter for care of pacemaker 05/05/2019   Gallstones    GERD (gastroesophageal reflux disease)    History of esophageal stricture    05/08/09   History of kidney stones    Kidney stones    LVH (left ventricular hypertrophy)    mild on imaging    Macular degeneration    Presence of permanent cardiac pacemaker    Solitary kidney    left kidney is atrophic    Past Surgical History:  Procedure Laterality Date   CATARACT EXTRACTION W/PHACO Right 08/10/2021   Procedure: CATARACT EXTRACTION PHACO AND INTRAOCULAR LENS PLACEMENT ([place]) RIGHT TORIC LENS 9.17 00:57.9 ;  Surgeon: Birder Robson, MD;  Location: [place];  Service: Ophthalmology;   Laterality: Right;   CATARACT EXTRACTION W/PHACO Left 08/31/2021   Procedure: CATARACT EXTRACTION PHACO AND INTRAOCULAR LENS PLACEMENT ([facility]) LEFT 8.45 00:47.8;  Surgeon: Birder Robson, MD;  Location: [place];  Service: Ophthalmology;  Laterality: Left;   FOOT SURGERY     right mortons neuroma    HOLEP-LASER ENUCLEATION OF THE PROSTATE WITH MORCELLATION N/A 07/10/2020   Procedure: HOLEP-LASER ENUCLEATION OF THE PROSTATE WITH MORCELLATION;  Surgeon: Billey Co, MD;  Location: [facility];  Service: Urology;  Laterality: N/A;   PACEMAKER PLACEMENT     2015 Dr. Caryl Comes for AV block   PERMANENT PACEMAKER INSERTION N/A 02/05/2014   Procedure: PERMANENT PACEMAKER INSERTION;  Surgeon: Deboraha Sprang, MD;  Location: [facility] CATH LAB;  Service: Cardiovascular;  Laterality: N/A;   Family History  Problem Relation Age of Onset   Diabetes Mother        died of complications    Cancer Father        bladder    Stroke Father    Social History   Tobacco Use   Smoking status: Former    Packs/day: 1.00    Years: 20.00    Total pack years: 20.00    Types: Cigarettes    Quit date: 10/21/1991    Years since quitting: 30.9   Smokeless tobacco: Never   Tobacco comments:    smoked [id number] <1 ppd no FH lung cancer   Substance Use Topics  Alcohol use: Yes    Alcohol/week: 7.0 standard drinks of alcohol    Types: 7 Standard drinks or equivalent per week    Comment: Drinks 2-3 ounces of liquor daily   Marital status: Married  ROS   Review of Systems  Cardiovascular:  Positive for dyspnea on exertion. Negative for chest pain and leg swelling.   Objective  Blood pressure 122/76, pulse 72, height '6\' 1"'$  (1.854 m), weight 208 lb 3.2 oz (94.4 kg), SpO2 97 %. Body mass index is 27.47 kg/m.      10/12/2022    3:46 PM 08/31/2022    4:30 PM 05/19/2022    2:58 PM  Vitals with BMI  Height '6\' 1"'$  '6\' 1"'$  '6\' 1"'$   Weight 208 lbs 3 oz 204 lbs 204 lbs 13 oz  BMI 27.47 81.01 75.10  Systolic 258   527  Diastolic 76  70  Pulse 72  65       Physical Exam Neck:     Vascular: No carotid bruit or JVD.  Cardiovascular:     Rate and Rhythm: Normal rate and regular rhythm.     Pulses: Intact distal pulses.     Heart sounds: Normal heart sounds. No murmur heard.    No gallop.  Pulmonary:     Effort: Pulmonary effort is normal.     Breath sounds: Normal breath sounds.  Abdominal:     General: Bowel sounds are normal.     Palpations: Abdomen is soft.  Musculoskeletal:     Right lower leg: No edema.     Left lower leg: No edema.   Laboratory examination:   Lab Results  Component Value Date   NA 140 05/16/2022   K 4.2 05/16/2022   CO2 29 05/16/2022   GLUCOSE 119 (H) 05/16/2022   BUN 13 05/16/2022   CREATININE 0.93 05/16/2022   CALCIUM 8.9 05/16/2022   GFRNONAA 85 (L) 02/05/2014       Latest Ref Rng & Units 05/16/2022    8:19 AM 04/26/2021    7:58 AM 05/01/2020   10:19 AM  CMP  Glucose 70 - 99 mg/dL 119  98  102   BUN 6 - 23 mg/dL '13  14  14   '$ Creatinine 0.40 - 1.50 mg/dL 0.93  1.00  0.95   Sodium 135 - 145 mEq/L 140  141  138   Potassium 3.5 - 5.1 mEq/L 4.2  4.4  4.3   Chloride 96 - 112 mEq/L 105  105  105   CO2 19 - 32 mEq/L '29  29  29   '$ Calcium 8.4 - 10.5 mg/dL 8.9  9.1  9.4   Total Protein 6.0 - 8.3 g/dL 6.7  6.7  6.7   Total Bilirubin 0.2 - 1.2 mg/dL 0.6  1.0  1.1   Alkaline Phos 39 - 117 U/L 74  63  66   AST 0 - 37 U/L '17  15  18   '$ ALT 0 - 53 U/L '15  13  17       '$ Latest Ref Rng & Units 05/16/2022    8:19 AM 04/26/2021    7:58 AM 05/01/2020   10:19 AM  CBC  WBC 4.0 - 10.5 K/uL 5.9  5.0  5.2   Hemoglobin 13.0 - 17.0 g/dL 14.5  14.9  14.5   Hematocrit 39.0 - 52.0 % 44.2  45.6  42.8   Platelets 150.0 - 400.0 K/uL 162.0  166.0  148.0    Lab Results  Component Value Date   CHOL 167 05/16/2022   HDL 42.60 05/16/2022   LDLCALC 93 04/26/2021   LDLDIRECT 87.0 05/16/2022   TRIG 222.0 (H) 05/16/2022   CHOLHDL 4 05/16/2022     HEMOGLOBIN A1C Lab Results   Component Value Date   HGBA1C 5.6 05/11/2018   TSH Recent Labs    05/16/22 0819  TSH 1.87   Allergies   Allergies  Allergen Reactions   Lipitor [Atorvastatin] Other (See Comments)    myalgias   Lisinopril Cough        Simvastatin Other (See Comments)    myalgias    Medications   Current Outpatient Medications:    apixaban (ELIQUIS) 5 MG TABS tablet, TAKE 1 TABLET(5 MG) BY MOUTH TWICE DAILY, Disp: 180 tablet, Rfl: 3   Ascorbic Acid (VITAMIN C) 1000 MG tablet, Take 1,000 mg by mouth daily., Disp: , Rfl:    carvedilol (COREG) 12.5 MG tablet, TAKE 1 TABLET(12.5 MG) BY MOUTH TWICE DAILY WITH A MEAL, Disp: 180 tablet, Rfl: 3   Cholecalciferol (VITAMIN D3) 125 MCG (5000 UT) TABS, Take 5,000 Units by mouth daily. , Disp: , Rfl:    Multiple Vitamins-Minerals (PRESERVISION AREDS 2 PO), Take by mouth in the morning and at bedtime., Disp: , Rfl:    omeprazole (PRILOSEC) 40 MG capsule, TAKE ONE CAPSULE BY MOUTH DAILY, TAKE 30 MINTUES BEFORE BREAKFAST, Disp: 90 capsule, Rfl: 3   pravastatin (PRAVACHOL) 40 MG tablet, TAKE 1 TABLET(40 MG) BY MOUTH DAILY AT NIGHT, Disp: 90 tablet, Rfl: 3   Zinc 50 MG CAPS, Take 50 mg by mouth daily. , Disp: , Rfl:    Radiology   No results found.  Cardiac Studies:   Abdominal aortic duplex 04/25/2017: Focal plaque noted in the proximal, mid and distal aorta. Clinical correlation is suggested. Aortic ectasia noted maximum measuring 2.9 x 2.91 x 2.88 cm. no AAA. Enlarged inferior vena cava suggests elevated central venous pressure. Consider rescreening in 5 years.    Echocardiogram 02/05/2014: Normal LV systolic function, EF 87-86%, mild LVH.  Mild TR.  Scheduled Remote pacemaker check 01/09/2021:  Brief PAT < 10 S. There was a <1 % cumulative atrial arrhythmia burden. There were 0 high ventricular rate episodes detected. Health trends do not demonstrate significant abnormality. Battery longevity is 7.0-7.6 years. RA pacing is 82.0 %, RV pacing is 99.0  %.  EKG   EKG 10/12/2022: Underlying probable atrial fibrillation with ventricularly paced rhythm.  No further analysis.  Compared to 02/21/2022, sinus rhythm is now replaced by atrial fibrillation.   Pacemaker St. Jude PM 2240 Dual chamber pacemaker implant 02/05/2014   Remote dual-chamber pacemaker transmission 10/07/2022: AP 0%, VP 99%.  Longevity middle of service, 9 months.  Lead impedance and thresholds within normal limits.  AF on 09/22/22 for 7 days, last AF was 07/30/2021 Normal function.   Scheduled  In office pacemaker check 09/28/22 Single (S)/Dual (D)/BV: D. Presenting ASV, mode switched to VVI 09/22/22 at 12:03AM. Pacemaker dependant:  Yes. Underlying CHB. AP 60%, VP 100%  AMS Episodes 1.  Sustained atrial fibrillation since 09/22/2022, AT/AF burden 32%.  HVR 0.  Longevity 9 months. Magnet rate: >85%. Lead measurements: Stable. Histogram: Low (L)/normal (N)/high (H)  Normal. Patient activity Good.   Observations: Normal pacemaker function. Changes: None.   Assessment     ICD-10-CM   1. Persistent atrial fibrillation (HCC)  I48.19 EKG 12-Lead    CBC    Basic metabolic panel    2. Atrioventricular block, complete ([place])  I44.2     3. Pacemaker St. Jude PM  2240 Dual chamber pacemaker implant  02/05/2014  Z95.0       No orders of the defined types were placed in this encounter.  Medications Discontinued During This Encounter  Medication Reason   benzonatate (TESSALON) 200 MG capsule Completed Course    This patients CHA2DS2-VASc Score 3 (HTN, Vasc, Age)and yearly risk of stroke 3.2%.   Recommendations:   [name]  is a 76 y.o. male with hypertension, hyperlipidemia, coronary calcification by CT chest in 2015, unilateral kidney due to congenital absence of left kidney, complete heart block S/P St. Jude dual chamber pacemaker implant  02/05/2014, paroxysmal atrial fibrillation noted on pacemaker transmission, has been on Eliquis since 06/24/2019. He has history of  known tolerance to statins, however on low dose of pravastatin.  1. Persistent atrial fibrillation ([place]) Patient was evaluated by me for pacemaker interrogation on 09/28/2022, he has also received remote alerts with persistent atrial fibrillation starting 09/22/2022.  Patient has had slight worsening dyspnea and decreased exercise tolerance since then.  Will schedule him for direct-current cardioversion.  Presently on Eliquis and has not missed any doses.  Schedule for Direct current cardioversion. I have discussed regarding risks benefits rate control vs rhythm control with the patient. Patient understands cardiac arrest and need for CPR, aspiration pneumonia, but not limited to these. Patient is willing.   2. Atrioventricular block, complete [facility]) Patient is pacemaker dependent, underlying complete heart block, will also have pacemaker programming done after cardioversion-[employer] Jude pacemaker rep available to me as well.  3. Pacemaker St. Jude PM  2240 Dual chamber pacemaker implant  02/05/2014 Pacemaker is functioning normally.  I will see him back in 3 to 4 weeks for follow-up.    Jose Prows, MD, [facility] 10/12/2022, 4:39 PM Office: [phone] Fax: [phone] Pager: [phone]

## 2022-10-12 NOTE — Progress Notes (Signed)
Primary Physician/Referring:  McLean-Scocuzza, Nino Glow, MD  Patient ID: [name], male    DOB: 25-Jan-1946, 76 y.o.   MRN: [id number]  Chief Complaint  Patient presents with   Follow-up   Hypertension   Atrial Fibrillation    HPI: [name]  is a 76 y.o. male male with hypertension, hyperlipidemia, coronary calcification by CT chest in 2015, unilateral kidney due to congenital absence of left kidney, complete heart block S/P St. Jude dual chamber pacemaker implant  02/05/2014, paroxysmal atrial fibrillation noted on pacemaker transmission, has been on Eliquis since 06/24/2019. He has history of known tolerance to statins, however on low dose of pravastatin.  He has had persistent atrial fibrillation since 09/22/2022, was seen in the office on 09/29/2022 which confirmed persistent atrial fibrillation on the pacemaker interrogation.  Normal functioning pacemaker.  He has noticed decreased exercise tolerance since that.  No leg edema, no PND or orthopnea.  Denies chest pain or palpitations or syncope.  Past Medical History:  Diagnosis Date   Atrophic kidney    left    Cataract    left eye mild    Chicken pox    COVID-19    09/2019/2021 ? month   Degenerative disc disease, lumbar    L4/5 noted imaging   Dyslipidemia    Encounter for care of pacemaker 05/05/2019   Gallstones    GERD (gastroesophageal reflux disease)    History of esophageal stricture    05/08/09   History of kidney stones    Kidney stones    LVH (left ventricular hypertrophy)    mild on imaging    Macular degeneration    Presence of permanent cardiac pacemaker    Solitary kidney    left kidney is atrophic    Past Surgical History:  Procedure Laterality Date   CATARACT EXTRACTION W/PHACO Right 08/10/2021   Procedure: CATARACT EXTRACTION PHACO AND INTRAOCULAR LENS PLACEMENT ([place]) RIGHT TORIC LENS 9.17 00:57.9 ;  Surgeon: Birder Robson, MD;  Location: [place];  Service: Ophthalmology;   Laterality: Right;   CATARACT EXTRACTION W/PHACO Left 08/31/2021   Procedure: CATARACT EXTRACTION PHACO AND INTRAOCULAR LENS PLACEMENT ([facility]) LEFT 8.45 00:47.8;  Surgeon: Birder Robson, MD;  Location: [place];  Service: Ophthalmology;  Laterality: Left;   FOOT SURGERY     right mortons neuroma    HOLEP-LASER ENUCLEATION OF THE PROSTATE WITH MORCELLATION N/A 07/10/2020   Procedure: HOLEP-LASER ENUCLEATION OF THE PROSTATE WITH MORCELLATION;  Surgeon: Billey Co, MD;  Location: [facility];  Service: Urology;  Laterality: N/A;   PACEMAKER PLACEMENT     2015 Dr. Caryl Comes for AV block   PERMANENT PACEMAKER INSERTION N/A 02/05/2014   Procedure: PERMANENT PACEMAKER INSERTION;  Surgeon: Deboraha Sprang, MD;  Location: [facility] CATH LAB;  Service: Cardiovascular;  Laterality: N/A;   Family History  Problem Relation Age of Onset   Diabetes Mother        died of complications    Cancer Father        bladder    Stroke Father    Social History   Tobacco Use   Smoking status: Former    Packs/day: 1.00    Years: 20.00    Total pack years: 20.00    Types: Cigarettes    Quit date: 10/21/1991    Years since quitting: 30.9   Smokeless tobacco: Never   Tobacco comments:    smoked [id number] <1 ppd no FH lung cancer   Substance Use Topics  Alcohol use: Yes    Alcohol/week: 7.0 standard drinks of alcohol    Types: 7 Standard drinks or equivalent per week    Comment: Drinks 2-3 ounces of liquor daily   Marital status: Married  ROS   Review of Systems  Cardiovascular:  Positive for dyspnea on exertion. Negative for chest pain and leg swelling.   Objective  Blood pressure 122/76, pulse 72, height '6\' 1"'$  (1.854 m), weight 208 lb 3.2 oz (94.4 kg), SpO2 97 %. Body mass index is 27.47 kg/m.      10/12/2022    3:46 PM 08/31/2022    4:30 PM 05/19/2022    2:58 PM  Vitals with BMI  Height '6\' 1"'$  '6\' 1"'$  '6\' 1"'$   Weight 208 lbs 3 oz 204 lbs 204 lbs 13 oz  BMI 27.47 69.45 03.88  Systolic 828   003  Diastolic 76  70  Pulse 72  65       Physical Exam Neck:     Vascular: No carotid bruit or JVD.  Cardiovascular:     Rate and Rhythm: Normal rate and regular rhythm.     Pulses: Intact distal pulses.     Heart sounds: Normal heart sounds. No murmur heard.    No gallop.  Pulmonary:     Effort: Pulmonary effort is normal.     Breath sounds: Normal breath sounds.  Abdominal:     General: Bowel sounds are normal.     Palpations: Abdomen is soft.  Musculoskeletal:     Right lower leg: No edema.     Left lower leg: No edema.   Laboratory examination:   Lab Results  Component Value Date   NA 140 05/16/2022   K 4.2 05/16/2022   CO2 29 05/16/2022   GLUCOSE 119 (H) 05/16/2022   BUN 13 05/16/2022   CREATININE 0.93 05/16/2022   CALCIUM 8.9 05/16/2022   GFRNONAA 85 (L) 02/05/2014       Latest Ref Rng & Units 05/16/2022    8:19 AM 04/26/2021    7:58 AM 05/01/2020   10:19 AM  CMP  Glucose 70 - 99 mg/dL 119  98  102   BUN 6 - 23 mg/dL '13  14  14   '$ Creatinine 0.40 - 1.50 mg/dL 0.93  1.00  0.95   Sodium 135 - 145 mEq/L 140  141  138   Potassium 3.5 - 5.1 mEq/L 4.2  4.4  4.3   Chloride 96 - 112 mEq/L 105  105  105   CO2 19 - 32 mEq/L '29  29  29   '$ Calcium 8.4 - 10.5 mg/dL 8.9  9.1  9.4   Total Protein 6.0 - 8.3 g/dL 6.7  6.7  6.7   Total Bilirubin 0.2 - 1.2 mg/dL 0.6  1.0  1.1   Alkaline Phos 39 - 117 U/L 74  63  66   AST 0 - 37 U/L '17  15  18   '$ ALT 0 - 53 U/L '15  13  17       '$ Latest Ref Rng & Units 05/16/2022    8:19 AM 04/26/2021    7:58 AM 05/01/2020   10:19 AM  CBC  WBC 4.0 - 10.5 K/uL 5.9  5.0  5.2   Hemoglobin 13.0 - 17.0 g/dL 14.5  14.9  14.5   Hematocrit 39.0 - 52.0 % 44.2  45.6  42.8   Platelets 150.0 - 400.0 K/uL 162.0  166.0  148.0    Lab Results  Component Value Date   CHOL 167 05/16/2022   HDL 42.60 05/16/2022   LDLCALC 93 04/26/2021   LDLDIRECT 87.0 05/16/2022   TRIG 222.0 (H) 05/16/2022   CHOLHDL 4 05/16/2022     HEMOGLOBIN A1C Lab Results   Component Value Date   HGBA1C 5.6 05/11/2018   TSH Recent Labs    05/16/22 0819  TSH 1.87   Allergies   Allergies  Allergen Reactions   Lipitor [Atorvastatin] Other (See Comments)    myalgias   Lisinopril Cough        Simvastatin Other (See Comments)    myalgias    Medications   Current Outpatient Medications:    apixaban (ELIQUIS) 5 MG TABS tablet, TAKE 1 TABLET(5 MG) BY MOUTH TWICE DAILY, Disp: 180 tablet, Rfl: 3   Ascorbic Acid (VITAMIN C) 1000 MG tablet, Take 1,000 mg by mouth daily., Disp: , Rfl:    carvedilol (COREG) 12.5 MG tablet, TAKE 1 TABLET(12.5 MG) BY MOUTH TWICE DAILY WITH A MEAL, Disp: 180 tablet, Rfl: 3   Cholecalciferol (VITAMIN D3) 125 MCG (5000 UT) TABS, Take 5,000 Units by mouth daily. , Disp: , Rfl:    Multiple Vitamins-Minerals (PRESERVISION AREDS 2 PO), Take by mouth in the morning and at bedtime., Disp: , Rfl:    omeprazole (PRILOSEC) 40 MG capsule, TAKE ONE CAPSULE BY MOUTH DAILY, TAKE 30 MINTUES BEFORE BREAKFAST, Disp: 90 capsule, Rfl: 3   pravastatin (PRAVACHOL) 40 MG tablet, TAKE 1 TABLET(40 MG) BY MOUTH DAILY AT NIGHT, Disp: 90 tablet, Rfl: 3   Zinc 50 MG CAPS, Take 50 mg by mouth daily. , Disp: , Rfl:    Radiology   No results found.  Cardiac Studies:   Abdominal aortic duplex 04/25/2017: Focal plaque noted in the proximal, mid and distal aorta. Clinical correlation is suggested. Aortic ectasia noted maximum measuring 2.9 x 2.91 x 2.88 cm. no AAA. Enlarged inferior vena cava suggests elevated central venous pressure. Consider rescreening in 5 years.    Echocardiogram 02/05/2014: Normal LV systolic function, EF 72-53%, mild LVH.  Mild TR.  Scheduled Remote pacemaker check 01/09/2021:  Brief PAT < 10 S. There was a <1 % cumulative atrial arrhythmia burden. There were 0 high ventricular rate episodes detected. Health trends do not demonstrate significant abnormality. Battery longevity is 7.0-7.6 years. RA pacing is 82.0 %, RV pacing is 99.0  %.  EKG   EKG 10/12/2022: Underlying probable atrial fibrillation with ventricularly paced rhythm.  No further analysis.  Compared to 02/21/2022, sinus rhythm is now replaced by atrial fibrillation.   Pacemaker St. Jude PM 2240 Dual chamber pacemaker implant 02/05/2014   Remote dual-chamber pacemaker transmission 10/07/2022: AP 0%, VP 99%.  Longevity middle of service, 9 months.  Lead impedance and thresholds within normal limits.  AF on 09/22/22 for 7 days, last AF was 07/30/2021 Normal function.   Scheduled  In office pacemaker check 09/28/22 Single (S)/Dual (D)/BV: D. Presenting ASV, mode switched to VVI 09/22/22 at 12:03AM. Pacemaker dependant:  Yes. Underlying CHB. AP 60%, VP 100%  AMS Episodes 1.  Sustained atrial fibrillation since 09/22/2022, AT/AF burden 32%.  HVR 0.  Longevity 9 months. Magnet rate: >85%. Lead measurements: Stable. Histogram: Low (L)/normal (N)/high (H)  Normal. Patient activity Good.   Observations: Normal pacemaker function. Changes: None.   Assessment     ICD-10-CM   1. Persistent atrial fibrillation (HCC)  I48.19 EKG 12-Lead    CBC    Basic metabolic panel    2. Atrioventricular block, complete ([place])  I44.2     3. Pacemaker [employer]. Jude PM  2240 Dual chamber pacemaker implant  02/05/2014  Z95.0       No orders of the defined types were placed in this encounter.  Medications Discontinued During This Encounter  Medication Reason   benzonatate (TESSALON) 200 MG capsule Completed Course    This patients CHA2DS2-VASc Score 3 (HTN, Vasc, Age)and yearly risk of stroke 3.2%.   Recommendations:   LATWAN LUCHSINGER  is a 76 y.o. male with hypertension, hyperlipidemia, coronary calcification by CT chest in 2015, unilateral kidney due to congenital absence of left kidney, complete heart block S/P St. Jude dual chamber pacemaker implant  02/05/2014, paroxysmal atrial fibrillation noted on pacemaker transmission, has been on Eliquis since 06/24/2019. He has history of  known tolerance to statins, however on low dose of pravastatin.  1. Persistent atrial fibrillation ([place]) Patient was evaluated by me for pacemaker interrogation on 09/28/2022, he has also received remote alerts with persistent atrial fibrillation starting 09/22/2022.  Patient has had slight worsening dyspnea and decreased exercise tolerance since then.  Will schedule him for direct-current cardioversion.  Presently on Eliquis and has not missed any doses.  Schedule for Direct current cardioversion. I have discussed regarding risks benefits rate control vs rhythm control with the patient. Patient understands cardiac arrest and need for CPR, aspiration pneumonia, but not limited to these. Patient is willing.   2. Atrioventricular block, complete [facility]) Patient is pacemaker dependent, underlying complete heart block, will also have pacemaker programming done after cardioversion-[employer] pacemaker rep available to me as well.  3. Pacemaker St. Jude PM  2240 Dual chamber pacemaker implant  02/05/2014 Pacemaker is functioning normally.  I will see him back in 3 to 4 weeks for follow-up.    Adrian Prows, MD, [facility] 10/12/2022, 4:39 PM Office: [phone] Fax: [phone] Pager: [phone]

## 2022-10-13 DIAGNOSIS — I480 Paroxysmal atrial fibrillation: Secondary | ICD-10-CM | POA: Diagnosis not present

## 2022-10-14 ENCOUNTER — Other Ambulatory Visit: Payer: Self-pay

## 2022-10-14 ENCOUNTER — Ambulatory Visit (HOSPITAL_BASED_OUTPATIENT_CLINIC_OR_DEPARTMENT_OTHER): Payer: Medicare Other | Admitting: Anesthesiology

## 2022-10-14 ENCOUNTER — Encounter (HOSPITAL_COMMUNITY)
Admission: RE | Disposition: A | Discharge status: Home / Self Care | Payer: Self-pay | Source: Home / Self Care | Attending: Cardiology

## 2022-10-14 ENCOUNTER — Encounter (HOSPITAL_COMMUNITY): Payer: Self-pay | Admitting: Cardiology

## 2022-10-14 ENCOUNTER — Ambulatory Visit (HOSPITAL_COMMUNITY): Payer: Medicare Other | Admitting: Anesthesiology

## 2022-10-14 ENCOUNTER — Ambulatory Visit (HOSPITAL_COMMUNITY)
Admission: RE | Admit: 2022-10-14 | Discharge: 2022-10-14 | Disposition: A | Discharge status: Home / Self Care | Payer: Medicare Other | Attending: Cardiology | Admitting: Cardiology

## 2022-10-14 DIAGNOSIS — I10 Essential (primary) hypertension: Secondary | ICD-10-CM | POA: Diagnosis not present

## 2022-10-14 DIAGNOSIS — Z7901 Long term (current) use of anticoagulants: Secondary | ICD-10-CM | POA: Diagnosis not present

## 2022-10-14 DIAGNOSIS — K219 Gastro-esophageal reflux disease without esophagitis: Secondary | ICD-10-CM | POA: Insufficient documentation

## 2022-10-14 DIAGNOSIS — Z950 Presence of cardiac pacemaker: Secondary | ICD-10-CM | POA: Diagnosis not present

## 2022-10-14 DIAGNOSIS — I442 Atrioventricular block, complete: Secondary | ICD-10-CM | POA: Insufficient documentation

## 2022-10-14 DIAGNOSIS — E785 Hyperlipidemia, unspecified: Secondary | ICD-10-CM | POA: Insufficient documentation

## 2022-10-14 DIAGNOSIS — Q600 Renal agenesis, unilateral: Secondary | ICD-10-CM | POA: Insufficient documentation

## 2022-10-14 DIAGNOSIS — I2510 Atherosclerotic heart disease of native coronary artery without angina pectoris: Secondary | ICD-10-CM | POA: Insufficient documentation

## 2022-10-14 DIAGNOSIS — Z87891 Personal history of nicotine dependence: Secondary | ICD-10-CM | POA: Diagnosis not present

## 2022-10-14 DIAGNOSIS — I4819 Other persistent atrial fibrillation: Secondary | ICD-10-CM | POA: Diagnosis not present

## 2022-10-14 DIAGNOSIS — I4891 Unspecified atrial fibrillation: Secondary | ICD-10-CM

## 2022-10-14 HISTORY — PX: CARDIOVERSION: SHX1299

## 2022-10-14 LAB — CBC
Hematocrit: 43.4 % (ref 37.5–51.0)
Hemoglobin: 13.9 g/dL (ref 13.0–17.7)
MCH: 28.2 pg (ref 26.6–33.0)
MCHC: 32 g/dL (ref 31.5–35.7)
MCV: 88 fL (ref 79–97)
Platelets: 190 10*3/uL (ref 150–450)
RBC: 4.93 x10E6/uL (ref 4.14–5.80)
RDW: 13.4 % (ref 11.6–15.4)
WBC: 8.4 10*3/uL (ref 3.4–10.8)

## 2022-10-14 LAB — BASIC METABOLIC PANEL
BUN/Creatinine Ratio: 18 (ref 10–24)
BUN: 18 mg/dL (ref 8–27)
CO2: 24 mmol/L (ref 20–29)
Calcium: 9.2 mg/dL (ref 8.6–10.2)
Chloride: 104 mmol/L (ref 96–106)
Creatinine, Ser: 0.99 mg/dL (ref 0.76–1.27)
Glucose: 100 mg/dL — ABNORMAL HIGH (ref 70–99)
Potassium: 4.5 mmol/L (ref 3.5–5.2)
Sodium: 142 mmol/L (ref 134–144)
eGFR: 79 mL/min/{1.73_m2} (ref >59)

## 2022-10-14 SURGERY — CARDIOVERSION
Anesthesia: General

## 2022-10-14 MED ORDER — PROPOFOL 10 MG/ML IV BOLUS
INTRAVENOUS | Status: DC | PRN
  Administered 2022-10-14 (×2): 70 mg via INTRAVENOUS

## 2022-10-14 MED ORDER — LIDOCAINE 2% (20 MG/ML) 5 ML SYRINGE
INTRAMUSCULAR | Status: DC | PRN
  Administered 2022-10-14: 50 mg via INTRAVENOUS

## 2022-10-14 MED ORDER — SODIUM CHLORIDE 0.9 % IV SOLN: INTRAVENOUS | Status: DC

## 2022-10-14 NOTE — Interval H&P Note (Signed)
History and Physical Interval Note:  10/14/2022 11:05 AM  [name]  has presented today for surgery, with the diagnosis of Atrial fibrillation.  The various methods of treatment have been discussed with the patient and family. After consideration of risks, benefits and other options for treatment, the patient has consented to  Procedure(s): CARDIOVERSION (N/A) as a surgical intervention.  The patient's history has been reviewed, patient examined, no change in status, stable for surgery.  I have reviewed the patient's chart and labs.  Questions were answered to the patient's satisfaction.     Adrian Prows

## 2022-10-14 NOTE — CV Procedure (Signed)
Direct current cardioversion 10/14/2022 11:26 AM  Indication symptomatic A. Fibrillation.  Procedure: Using 70 + 70 mg of IV Propofol and 70+70 IV Lidocaine (for reducing venous pain) for achieving deep sedation, synchronized direct current cardioversion performed. Patient was delivered with 120 then 200 Joules of electricity X 1 with success to A-BV paced rhythm. Patient tolerated the procedure well. No immediate complication noted.  Patient initially felt to be in sinus after first cardioversion. Pacemaker Interrogation done, still in coarse A. Fib and hence induced him again and shocked second time successfully.   Allergies as of 10/14/2022       Reactions   Lipitor [atorvastatin] Other (See Comments)   myalgias   Lisinopril Cough      Simvastatin Other (See Comments)   myalgias        Medication List     TAKE these medications    apixaban 5 MG Tabs tablet Commonly known as: Eliquis TAKE 1 TABLET(5 MG) BY MOUTH TWICE DAILY   carvedilol 12.5 MG tablet Commonly known as: COREG TAKE 1 TABLET(12.5 MG) BY MOUTH TWICE DAILY WITH A MEAL   omeprazole 40 MG capsule Commonly known as: PRILOSEC TAKE ONE CAPSULE BY MOUTH DAILY, TAKE 30 MINTUES BEFORE BREAKFAST   pravastatin 40 MG tablet Commonly known as: PRAVACHOL TAKE 1 TABLET(40 MG) BY MOUTH DAILY AT NIGHT   PRESERVISION AREDS 2 PO Take 1 tablet by mouth in the morning and at bedtime.   vitamin C 1000 MG tablet Take 1,000 mg by mouth daily.   Vitamin D3 125 MCG (5000 UT) Tabs Take 5,000 Units by mouth daily.   Zinc 50 MG Caps Take 50 mg by mouth daily.          Adrian Prows, MD, [facility] 10/14/2022, 11:26 AM Office: [phone] Fax: [phone] Pager: [phone]

## 2022-10-14 NOTE — Anesthesia Postprocedure Evaluation (Signed)
Anesthesia Post Note  Patient: [name]  Procedure(s) Performed: CARDIOVERSION     Patient location during evaluation: PACU Anesthesia Type: General Level of consciousness: awake and alert and oriented Pain management: pain level controlled Vital Signs Assessment: post-procedure vital signs reviewed and stable Respiratory status: spontaneous breathing, nonlabored ventilation and respiratory function stable Cardiovascular status: blood pressure returned to baseline and stable Postop Assessment: no apparent nausea or vomiting Anesthetic complications: no   No notable events documented.  Last Vitals:  Vitals:   10/14/22 1034 10/14/22 1130  BP: (!) 118/91 101/71  Pulse: 70 62  Resp: 12 15  Temp: 36.6 C (!) 36.4 C  SpO2: 96% 92%    Last Pain:  Vitals:   10/14/22 1130  TempSrc: Temporal  PainSc: 0-No pain                 Jaclyn Andy A.

## 2022-10-14 NOTE — Anesthesia Preprocedure Evaluation (Signed)
Anesthesia Evaluation  Patient identified by MRN, date of birth, ID band Patient awake    Reviewed: Allergy & Precautions, NPO status , Patient's Chart, lab work & pertinent test results  Airway Mallampati: II  TM Distance: >3 FB Neck ROM: Full    Dental no notable dental hx. (+) Teeth Intact, Caps, Dental Advisory Given   Pulmonary former smoker   Pulmonary exam normal breath sounds clear to auscultation       Cardiovascular hypertension, Pt. on medications + CAD  Normal cardiovascular exam+ dysrhythmias Atrial Fibrillation + pacemaker  Rhythm:Irregular Rate:Normal     Neuro/Psych  negative psych ROS   GI/Hepatic Neg liver ROS,GERD  Medicated,,  Endo/Other  Hyperlipidemia  Renal/GU Renal InsufficiencyRenal disease  negative genitourinary   Musculoskeletal  (+) Arthritis , Osteoarthritis,    Abdominal   Peds  Hematology Eliquis therapy- last dose this am   Anesthesia Other Findings   Reproductive/Obstetrics                              Anesthesia Physical Anesthesia Plan  ASA: 2  Anesthesia Plan: General   Post-op Pain Management: Minimal or no pain anticipated   Induction:   PONV Risk Score and Plan: 2 and Treatment may vary due to age or medical condition  Airway Management Planned: Mask  Additional Equipment: None  Intra-op Plan:   Post-operative Plan:   Informed Consent: I have reviewed the patients History and Physical, chart, labs and discussed the procedure including the risks, benefits and alternatives for the proposed anesthesia with the patient or authorized representative who has indicated his/her understanding and acceptance.     Dental advisory given  Plan Discussed with: CRNA and Anesthesiologist  Anesthesia Plan Comments:          Anesthesia Quick Evaluation

## 2022-10-14 NOTE — Transfer of Care (Signed)
Immediate Anesthesia Transfer of Care Note  Patient: [name]  Procedure(s) Performed: CARDIOVERSION  Patient Location: Short Stay  Anesthesia Type:MAC  Level of Consciousness: drowsy  Airway & Oxygen Therapy: Patient Spontanous Breathing  Post-op Assessment: Report given to RN and Post -op Vital signs reviewed and stable  Post vital signs: Reviewed and stable  Last Vitals:  Vitals Value Taken Time  BP 114/76   Temp    Pulse 63   Resp 12   SpO2 95     Last Pain:  Vitals:   10/14/22 1034  TempSrc: Temporal  PainSc: 0-No pain         Complications: No notable events documented.

## 2022-10-14 NOTE — Anesthesia Procedure Notes (Signed)
Procedure Name: General with mask airway Date/Time: 10/14/2022 11:16 AM  Performed by: Erick Colace, CRNAPre-anesthesia Checklist: Patient identified, Emergency Drugs available, Suction available and Patient being monitored Patient Re-evaluated:Patient Re-evaluated prior to induction Oxygen Delivery Method: Ambu bag Preoxygenation: Pre-oxygenation with 100% oxygen Induction Type: IV induction Dental Injury: Teeth and Oropharynx as per pre-operative assessment

## 2022-10-17 ENCOUNTER — Encounter (HOSPITAL_COMMUNITY): Payer: Self-pay | Admitting: Cardiology

## 2022-11-01 ENCOUNTER — Ambulatory Visit: Payer: Medicare Other | Admitting: Cardiology

## 2022-11-01 ENCOUNTER — Encounter: Payer: Self-pay | Admitting: Cardiology

## 2022-11-01 VITALS — BP 129/87 | HR 83 | Ht 73.0 in | Wt 209.0 lb

## 2022-11-01 DIAGNOSIS — I480 Paroxysmal atrial fibrillation: Secondary | ICD-10-CM | POA: Diagnosis not present

## 2022-11-01 DIAGNOSIS — I442 Atrioventricular block, complete: Secondary | ICD-10-CM

## 2022-11-01 DIAGNOSIS — Z950 Presence of cardiac pacemaker: Secondary | ICD-10-CM | POA: Diagnosis not present

## 2022-11-01 DIAGNOSIS — I2510 Atherosclerotic heart disease of native coronary artery without angina pectoris: Secondary | ICD-10-CM | POA: Diagnosis not present

## 2022-11-01 NOTE — Progress Notes (Signed)
Primary Physician/Referring:  McLean-Scocuzza, Nino Glow, MD  Patient ID: [name], male    DOB: November 21, 1945, 76 y.o.   MRN: [id number]  Chief Complaint  Patient presents with   Atrial Fibrillation   Follow-up    Cardioversion     HPI: [name]  is a 76 y.o. male with hypertension, hyperlipidemia, coronary calcification by CT chest in 2015, unilateral kidney due to congenital absence of left kidney, complete heart block S/P St. Jude dual chamber pacemaker implant  02/05/2014, paroxysmal atrial fibrillation, has been on Eliquis since 06/24/2019. He has history of known tolerance to statins, however on low dose of pravastatin.    He had been in persistent atrial fibrillation starting 09/22/2022, underwent direct-current cardioversion on 10/14/2022 and now presents for follow-up.  States that since cardioversion, his energy level is increased, dyspnea has essentially resolved.  He is back to doing all his activities without any limitations.  Past Medical History:  Diagnosis Date   Atrophic kidney    left    Cataract    left eye mild    Chicken pox    COVID-19    09/2019/2021 ? month   Degenerative disc disease, lumbar    L4/5 noted imaging   Dyslipidemia    Encounter for care of pacemaker 05/05/2019   Gallstones    GERD (gastroesophageal reflux disease)    History of esophageal stricture    05/08/09   History of kidney stones    Kidney stones    LVH (left ventricular hypertrophy)    mild on imaging    Macular degeneration    Presence of permanent cardiac pacemaker    Solitary kidney    left kidney is atrophic    Past Surgical History:  Procedure Laterality Date   CARDIOVERSION N/A 10/14/2022   Procedure: CARDIOVERSION;  Surgeon: Adrian Prows, MD;  Location: [place];  Service: Cardiovascular;  Laterality: N/A;   CATARACT EXTRACTION W/PHACO Right 08/10/2021   Procedure: CATARACT EXTRACTION PHACO AND INTRAOCULAR LENS PLACEMENT ([place]) RIGHT TORIC LENS 9.17 00:57.9 ;   Surgeon: Birder Robson, MD;  Location: [place];  Service: Ophthalmology;  Laterality: Right;   CATARACT EXTRACTION W/PHACO Left 08/31/2021   Procedure: CATARACT EXTRACTION PHACO AND INTRAOCULAR LENS PLACEMENT (IOC) LEFT 8.45 00:47.8;  Surgeon: Birder Robson, MD;  Location: [place];  Service: Ophthalmology;  Laterality: Left;   FOOT SURGERY     right mortons neuroma    HOLEP-LASER ENUCLEATION OF THE PROSTATE WITH MORCELLATION N/A 07/10/2020   Procedure: HOLEP-LASER ENUCLEATION OF THE PROSTATE WITH MORCELLATION;  Surgeon: Billey Co, MD;  Location: [facility];  Service: Urology;  Laterality: N/A;   PACEMAKER PLACEMENT     2015 Dr. Caryl Comes for AV block   PERMANENT PACEMAKER INSERTION N/A 02/05/2014   Procedure: PERMANENT PACEMAKER INSERTION;  Surgeon: Deboraha Sprang, MD;  Location: [facility] CATH [facility];  Service: Cardiovascular;  Laterality: N/A;   Family History  Problem Relation Age of Onset   Diabetes Mother        died of complications    Cancer Father        bladder    Stroke Father    Social History   Tobacco Use   Smoking status: Former    Packs/day: 1.00    Years: 20.00    Total pack years: 20.00    Types: Cigarettes    Quit date: 10/21/1991    Years since quitting: 31.0   Smokeless tobacco: Never   Tobacco comments:  smoked [id number] <1 ppd no FH lung cancer   Substance Use Topics   Alcohol use: Yes    Alcohol/week: 7.0 standard drinks of alcohol    Types: 7 Standard drinks or equivalent per week    Comment: Drinks 2-3 ounces of liquor daily   Marital status: Married  ROS   Review of Systems  Cardiovascular:  Negative for chest pain, dyspnea on exertion and leg swelling.   Objective  Blood pressure 129/87, pulse 83, height _0  (1.854 m), weight 209 lb (94.8 kg), SpO2 98 %. Body mass index is 27.57 kg/m.      11/01/2022    3:31 PM 10/14/2022   12:00 PM 10/14/2022   11:50 AM  Vitals with BMI  Height _1     Weight 209 lbs    BMI  44.81    Systolic 856  314  Diastolic 87  83  Pulse 83 60 60       Physical Exam Neck:     Vascular: No carotid bruit or JVD.  Cardiovascular:     Rate and Rhythm: Normal rate and regular rhythm.     Pulses: Intact distal pulses.     Heart sounds: Normal heart sounds. No murmur heard.    No gallop.  Pulmonary:     Effort: Pulmonary effort is normal.     Breath sounds: Normal breath sounds.  Abdominal:     General: Bowel sounds are normal.     Palpations: Abdomen is soft.  Musculoskeletal:     Right lower leg: No edema.     Left lower leg: No edema.    Laboratory examination:   Lab Results  Component Value Date   NA 142 10/13/2022   K 4.5 10/13/2022   CO2 24 10/13/2022   GLUCOSE 100 (H) 10/13/2022   BUN 18 10/13/2022   CREATININE 0.99 10/13/2022   CALCIUM 9.2 10/13/2022   EGFR 79 10/13/2022   GFRNONAA 85 (L) 02/05/2014       Latest Ref Rng & Units 10/13/2022    1:30 PM 05/16/2022    8:19 AM 04/26/2021    7:58 AM  CMP  Glucose 70 - 99 mg/dL 100  119  98   BUN 8 - 27 mg/dL _2 Creatinine 0.76 - 1.27 mg/dL 0.99  0.93  1.00   Sodium 134 - 144 mmol/L 142  140  141   Potassium 3.5 - 5.2 mmol/L 4.5  4.2  4.4   Chloride 96 - 106 mmol/L 104  105  105   CO2 20 - 29 mmol/L _3 Calcium 8.6 - 10.2 mg/dL 9.2  8.9  9.1   Total Protein 6.0 - 8.3 g/dL  6.7  6.7   Total Bilirubin 0.2 - 1.2 mg/dL  0.6  1.0   Alkaline Phos 39 - 117 U/L  74  63   AST 0 - 37 U/L  17  15   ALT 0 - 53 U/L  15  13       Latest Ref Rng & Units 10/13/2022    1:30 PM 05/16/2022    8:19 AM 04/26/2021    7:58 AM  CBC  WBC 3.4 - 10.8 x10E3/uL 8.4  5.9  5.0   Hemoglobin 13.0 - 17.7 g/dL 13.9  14.5  14.9   Hematocrit 37.5 - 51.0 % 43.4  44.2  45.6   Platelets 150 - 450 x10E3/uL 190  162.0  166.0  Lab Results  Component Value Date   CHOL 167 05/16/2022   HDL 42.60 05/16/2022   LDLCALC 93 04/26/2021   LDLDIRECT 87.0 05/16/2022   TRIG 222.0 (H) 05/16/2022   CHOLHDL 4 05/16/2022      HEMOGLOBIN A1C Lab Results  Component Value Date   HGBA1C 5.6 05/11/2018   TSH Recent Labs    05/16/22 0819  TSH 1.87    Allergies   Allergies  Allergen Reactions   Lipitor [Atorvastatin] Other (See Comments)    myalgias   Lisinopril Cough        Simvastatin Other (See Comments)    myalgias    Medications   Current Outpatient Medications:    apixaban (ELIQUIS) 5 MG TABS tablet, TAKE 1 TABLET(5 MG) BY MOUTH TWICE DAILY, Disp: 180 tablet, Rfl: 3   Ascorbic Acid (VITAMIN C) 1000 MG tablet, Take 1,000 mg by mouth daily., Disp: , Rfl:    carvedilol (COREG) 12.5 MG tablet, TAKE 1 TABLET(12.5 MG) BY MOUTH TWICE DAILY WITH A MEAL, Disp: 180 tablet, Rfl: 3   Cholecalciferol (VITAMIN D3) 125 MCG (5000 UT) TABS, Take 5,000 Units by mouth daily. , Disp: , Rfl:    Multiple Vitamins-Minerals (PRESERVISION AREDS 2 PO), Take 1 tablet by mouth in the morning and at bedtime., Disp: , Rfl:    omeprazole (PRILOSEC) 40 MG capsule, TAKE ONE CAPSULE BY MOUTH DAILY, TAKE 30 MINTUES BEFORE BREAKFAST, Disp: 90 capsule, Rfl: 3   pravastatin (PRAVACHOL) 40 MG tablet, TAKE 1 TABLET(40 MG) BY MOUTH DAILY AT NIGHT, Disp: 90 tablet, Rfl: 3   Zinc 50 MG CAPS, Take 50 mg by mouth daily. , Disp: , Rfl:    Radiology   No results found.  Cardiac Studies:   Abdominal aortic duplex 04/25/2017: Focal plaque noted in the proximal, mid and distal aorta. Clinical correlation is suggested. Aortic ectasia noted maximum measuring 2.9 x 2.91 x 2.88 cm. no AAA. Enlarged inferior vena cava suggests elevated central venous pressure. Consider rescreening in 5 years.    Echocardiogram 02/05/2014: Normal LV systolic function, EF 87-68%, mild LVH.  Mild TR.  Scheduled Remote pacemaker check 01/09/2021:  Brief PAT < 10 S. There was a <1 % cumulative atrial arrhythmia burden. There were 0 high ventricular rate episodes detected. Health trends do not demonstrate significant abnormality. Battery longevity is 7.0-7.6  years. RA pacing is 82.0 %, RV pacing is 99.0 %.  EKG   EKG 11/01/2022: AV paced rhythm.  No further analysis.  Compared to 10/12/2022, atrial fibrillation not present.  Pacemaker St. Jude PM 2240 Dual chamber pacemaker implant 02/05/2014   Remote dual-chamber pacemaker transmission 10/07/2022: AP 0%, VP 99%.  Longevity middle of service, 9 months.  Lead impedance and thresholds within normal limits.  AF on 09/22/22 for 7 days, last AF was 07/30/2021 Normal function.   Scheduled  In office pacemaker check 09/28/22 Single (S)/Dual (D)/BV: D. Presenting ASV, mode switched to VVI 09/22/22 at 12:03AM. Pacemaker dependant:  Yes. Underlying CHB. AP 60%, VP 100%  AMS Episodes 1.  Sustained atrial fibrillation since 09/22/2022, AT/AF burden 32%.  HVR 0.  Longevity 9 months. Magnet rate: >85%. Lead measurements: Stable. Histogram: Low (L)/normal (N)/high (H)  Normal. Patient activity Good.   Observations: Normal pacemaker function. Changes: None.   Assessment     ICD-10-CM   1. Paroxysmal atrial fibrillation (HCC)  I48.0 EKG 12-Lead    2. Atrioventricular block, complete (HCC)  I44.2     3. Pacemaker St. Jude PM  2240 Dual  chamber pacemaker implant  02/05/2014  Z95.0       No orders of the defined types were placed in this encounter.  There are no discontinued medications.  This patients CHA2DS2-VASc Score 3 (HTN, Vasc, Age)and yearly risk of stroke 3.2%.   Recommendations:   [name]  is a 76 y.o. male with hypertension, hyperlipidemia, coronary calcification by CT chest in 2015, unilateral kidney due to congenital absence of left kidney, complete heart block S/P St. Jude dual chamber pacemaker implant  02/05/2014, paroxysmal atrial fibrillation, has been on Eliquis since 06/24/2019. He has history of known tolerance to statins, however on low dose of pravastatin.  He had been in persistent atrial fibrillation starting 09/22/2022, underwent direct-current cardioversion on 10/14/2022 and now  presents for follow-up.  1. Paroxysmal atrial fibrillation [facility]) Patient is maintaining sinus rhythm.  His last atrial fibrillation which is brief and spontaneously current sinus rhythm was in 2020.  Hopefully he will maintain sinus rhythm, otherwise we will have to consider AAD.  He has noticed marked improvement in fatigue and overall wellbeing since cardioversion.  2. Atrioventricular block, complete [facility]) He is pacemaker dependent.  3. Pacemaker St. Jude PM  2240 Dual chamber pacemaker implant  02/05/2014 Pacemaker is functioning normally.  Will continue to transmit remotely and will monitor for atrial fibrillation burden.  Otherwise he remained stable, on appropriate medical therapy, he continues to remain active.  I will see him back in a year or sooner if problems.   Adrian Prows, MD, [facility] 11/01/2022, 3:59 PM Office: [phone] Fax: [phone] Pager: [phone]

## 2023-01-06 DIAGNOSIS — I442 Atrioventricular block, complete: Secondary | ICD-10-CM | POA: Diagnosis not present

## 2023-01-06 DIAGNOSIS — Z45018 Encounter for adjustment and management of other part of cardiac pacemaker: Secondary | ICD-10-CM | POA: Diagnosis not present

## 2023-01-18 ENCOUNTER — Telehealth: Payer: Medicare Other | Admitting: Orthopaedic Surgery

## 2023-01-18 NOTE — Telephone Encounter (Signed)
Pt called requesting April J to submit to his insurance company for right knee gel injection. Pt states he has medicare [employer]n and ask for April J to call so he may give his his info. Pt phone number is [phone]

## 2023-01-20 NOTE — Telephone Encounter (Signed)
VOB submitted Monovisc, right knee. Talked with patient and advised him that I can see his new insurance information in his chart.

## 2023-02-20 DIAGNOSIS — K219 Gastro-esophageal reflux disease without esophagitis: Secondary | ICD-10-CM | POA: Diagnosis not present

## 2023-02-20 DIAGNOSIS — Z7901 Long term (current) use of anticoagulants: Secondary | ICD-10-CM | POA: Diagnosis not present

## 2023-02-20 DIAGNOSIS — I480 Paroxysmal atrial fibrillation: Secondary | ICD-10-CM | POA: Diagnosis not present

## 2023-02-20 DIAGNOSIS — Z0000 Encounter for general adult medical examination without abnormal findings: Secondary | ICD-10-CM | POA: Diagnosis not present

## 2023-02-20 DIAGNOSIS — Z950 Presence of cardiac pacemaker: Secondary | ICD-10-CM | POA: Diagnosis not present

## 2023-02-20 DIAGNOSIS — E7800 Pure hypercholesterolemia, unspecified: Secondary | ICD-10-CM | POA: Diagnosis not present

## 2023-02-21 ENCOUNTER — Ambulatory Visit: Payer: No Typology Code available for payment source | Admitting: Internal Medicine

## 2023-02-21 ENCOUNTER — Encounter: Payer: Self-pay | Admitting: Internal Medicine

## 2023-02-21 ENCOUNTER — Ambulatory Visit: Payer: No Typology Code available for payment source | Admitting: Student

## 2023-02-21 VITALS — BP 124/80 | HR 60 | Ht 73.0 in | Wt 205.0 lb

## 2023-02-21 DIAGNOSIS — I480 Paroxysmal atrial fibrillation: Secondary | ICD-10-CM

## 2023-02-21 DIAGNOSIS — Z950 Presence of cardiac pacemaker: Secondary | ICD-10-CM

## 2023-02-21 DIAGNOSIS — I442 Atrioventricular block, complete: Secondary | ICD-10-CM

## 2023-02-21 NOTE — Progress Notes (Signed)
Primary Physician/Referring:  Barbette Reichmann, MD  Patient ID: [name], male    DOB: [birth year], 77 y.o.   MRN: [id number]  Chief Complaint  Patient presents with   Atrial Fibrillation   Hypertension   Follow-up    HPI: [name]  is a 77 y.o. male with hypertension, hyperlipidemia, coronary calcification by CT chest in 2015, unilateral kidney due to congenital absence of left kidney, complete heart block S/P St. Jude dual chamber pacemaker implant  02/05/2014, paroxysmal atrial fibrillation, has been on Eliquis since 06/24/2019. He has history of known tolerance to statins, however on low dose of pravastatin.   Patient presents today for follow-up visit. He has been feeling great since the last time he was here. He is maintaining sinus rhythm. Patient denies chest pain, shortness of breath, palpitations, diaphoresis, syncope, edema, PND, orthopnea. He does not feel tired with activity or at all anymore now that he is maintaining sinus rhythm. No concerns or complaints today. He says he is feeling great.   Past Medical History:  Diagnosis Date   Atrophic kidney    left    Cataract    left eye mild    Chicken pox    COVID-19    09/2019/2021 ? month   Degenerative disc disease, lumbar    L4/5 noted imaging   Dyslipidemia    Encounter for care of pacemaker 05/05/2019   Gallstones    GERD (gastroesophageal reflux disease)    History of esophageal stricture    05/08/09   History of kidney stones    Kidney stones    LVH (left ventricular hypertrophy)    mild on imaging    Macular degeneration    Presence of permanent cardiac pacemaker    Solitary kidney    left kidney is atrophic    Past Surgical History:  Procedure Laterality Date   CARDIOVERSION N/A 10/14/2022   Procedure: CARDIOVERSION;  Surgeon: Yates Decamp, MD;  Location: [facility] ENDOSCOPY;  Service: Cardiovascular;  Laterality: N/A;   CATARACT EXTRACTION W/PHACO Right 08/10/2021   Procedure: CATARACT EXTRACTION PHACO  AND INTRAOCULAR LENS PLACEMENT (IOC) RIGHT TORIC LENS 9.17 00:57.9 ;  Surgeon: Galen Manila, MD;  Location: [facility];  Service: Ophthalmology;  Laterality: Right;   CATARACT EXTRACTION W/PHACO Left 08/31/2021   Procedure: CATARACT EXTRACTION PHACO AND INTRAOCULAR LENS PLACEMENT (IOC) LEFT 8.45 00:47.8;  Surgeon: Galen Manila, MD;  Location: [facility];  Service: Ophthalmology;  Laterality: Left;   FOOT SURGERY     right mortons neuroma    HOLEP-LASER ENUCLEATION OF THE PROSTATE WITH MORCELLATION N/A 07/10/2020   Procedure: HOLEP-LASER ENUCLEATION OF THE PROSTATE WITH MORCELLATION;  Surgeon: Sondra Come, MD;  Location: [facility] ORS;  Service: Urology;  Laterality: N/A;   PACEMAKER PLACEMENT     2015 Dr. Graciela Husbands for AV block   PERMANENT PACEMAKER INSERTION N/A 02/05/2014   Procedure: PERMANENT PACEMAKER INSERTION;  Surgeon: Duke Salvia, MD;  Location: [facility] CATH LAB;  Service: Cardiovascular;  Laterality: N/A;   Family History  Problem Relation Age of Onset   Diabetes Mother        died of complications    Cancer Father        bladder    Stroke Father    Social History   Tobacco Use   Smoking status: Former    Packs/day: 1.00    Years: 20.00    Additional pack years: 0.00    Total pack years: 20.00    Types:  Cigarettes    Quit date: 10/21/1991    Years since quitting: 31.3   Smokeless tobacco: Never   Tobacco comments:    smoked [id number] <1 ppd no FH lung cancer   Substance Use Topics   Alcohol use: Yes    Alcohol/week: 7.0 standard drinks of alcohol    Types: 7 Standard drinks or equivalent per week    Comment: Drinks 2-3 ounces of liquor daily   Marital status: Married  ROS   Review of Systems  Cardiovascular:  Negative for chest pain, dyspnea on exertion and leg swelling.   Objective  Blood pressure 124/80, pulse 60, height 6\' 1"  (1.854 m), weight 205 lb (93 kg), SpO2 100 %. Body mass index is 27.05 kg/m.      02/21/2023    8:27 AM  11/01/2022    3:31 PM 10/14/2022   12:00 PM  Vitals with BMI  Height 6\' 1"  6\' 1"    Weight 205 lbs 209 lbs   BMI 27.05 27.58   Systolic 124 129   Diastolic 80 87   Pulse 60 83 60       Physical Exam Neck:     Vascular: No carotid bruit or JVD.  Cardiovascular:     Rate and Rhythm: Normal rate and regular rhythm.     Pulses: Intact distal pulses.     Heart sounds: Normal heart sounds. No murmur heard.    No gallop.  Pulmonary:     Effort: Pulmonary effort is normal.     Breath sounds: Normal breath sounds.  Abdominal:     General: Bowel sounds are normal.     Palpations: Abdomen is soft.  Musculoskeletal:     Right lower leg: No edema.     Left lower leg: No edema.    Laboratory examination:   Lab Results  Component Value Date   NA 142 10/13/2022   K 4.5 10/13/2022   CO2 24 10/13/2022   GLUCOSE 100 (H) 10/13/2022   BUN 18 10/13/2022   CREATININE 0.99 10/13/2022   CALCIUM 9.2 10/13/2022   EGFR 79 10/13/2022   GFRNONAA 85 (L) 02/05/2014       Latest Ref Rng & Units 10/13/2022    1:30 PM 05/16/2022    8:19 AM 04/26/2021    7:58 AM  CMP  Glucose 70 - 99 mg/dL 086  761  98   BUN 8 - 27 mg/dL 18  13  14    Creatinine 0.76 - 1.27 mg/dL 9.50  9.32  6.71   Sodium 134 - 144 mmol/L 142  140  141   Potassium 3.5 - 5.2 mmol/L 4.5  4.2  4.4   Chloride 96 - 106 mmol/L 104  105  105   CO2 20 - 29 mmol/L 24  29  29    Calcium 8.6 - 10.2 mg/dL 9.2  8.9  9.1   Total Protein 6.0 - 8.3 g/dL  6.7  6.7   Total Bilirubin 0.2 - 1.2 mg/dL  0.6  1.0   Alkaline Phos 39 - 117 U/L  74  63   AST 0 - 37 U/L  17  15   ALT 0 - 53 U/L  15  13       Latest Ref Rng & Units 10/13/2022    1:30 PM 05/16/2022    8:19 AM 04/26/2021    7:58 AM  CBC  WBC 3.4 - 10.8 x10E3/uL 8.4  5.9  5.0   Hemoglobin 13.0 - 17.7 g/dL 13.9  14.5  14.9   Hematocrit 37.5 - 51.0 % 43.4  44.2  45.6   Platelets 150 - 450 x10E3/uL 190  162.0  166.0    Lab Results  Component Value Date   CHOL 167 05/16/2022   HDL  42.60 05/16/2022   LDLCALC 93 04/26/2021   LDLDIRECT 87.0 05/16/2022   TRIG 222.0 (H) 05/16/2022   CHOLHDL 4 05/16/2022     HEMOGLOBIN A1C Lab Results  Component Value Date   HGBA1C 5.6 05/11/2018   TSH Recent Labs    05/16/22 0819  TSH 1.87   Allergies   Allergies  Allergen Reactions   Lipitor [Atorvastatin] Other (See Comments)    myalgias   Lisinopril Cough        Simvastatin Other (See Comments)    myalgias    Medications   Current Outpatient Medications:    apixaban (ELIQUIS) 5 MG TABS tablet, TAKE 1 TABLET(5 MG) BY MOUTH TWICE DAILY, Disp: 180 tablet, Rfl: 3   Ascorbic Acid (VITAMIN C) 1000 MG tablet, Take 1,000 mg by mouth daily., Disp: , Rfl:    carvedilol (COREG) 12.5 MG tablet, TAKE 1 TABLET(12.5 MG) BY MOUTH TWICE DAILY WITH A MEAL, Disp: 180 tablet, Rfl: 3   Cholecalciferol (VITAMIN D3) 125 MCG (5000 UT) TABS, Take 5,000 Units by mouth daily. , Disp: , Rfl:    Multiple Vitamins-Minerals (PRESERVISION AREDS 2 PO), Take 1 tablet by mouth in the morning and at bedtime., Disp: , Rfl:    omeprazole (PRILOSEC) 40 MG capsule, TAKE ONE CAPSULE BY MOUTH DAILY, TAKE 30 MINTUES BEFORE BREAKFAST, Disp: 90 capsule, Rfl: 3   pravastatin (PRAVACHOL) 40 MG tablet, TAKE 1 TABLET(40 MG) BY MOUTH DAILY AT NIGHT, Disp: 90 tablet, Rfl: 3   Zinc 50 MG CAPS, Take 50 mg by mouth daily. , Disp: , Rfl:    Radiology   No results found.  Cardiac Studies:   Abdominal aortic duplex 04/25/2017: Focal plaque noted in the proximal, mid and distal aorta. Clinical correlation is suggested. Aortic ectasia noted maximum measuring 2.9 x 2.91 x 2.88 cm. no AAA. Enlarged inferior vena cava suggests elevated central venous pressure. Consider rescreening in 5 years.    Echocardiogram 02/05/2014: Normal LV systolic function, EF 55-60%, mild LVH.  Mild TR.  Scheduled Remote pacemaker check 01/09/2021:  Brief PAT < 10 S. There was a <1 % cumulative atrial arrhythmia burden. There were 0 high  ventricular rate episodes detected. Health trends do not demonstrate significant abnormality. Battery longevity is 7.0-7.6 years. RA pacing is 82.0 %, RV pacing is 99.0 %.  EKG   EKG 11/01/2022: AV paced rhythm.  No further analysis.  Compared to 10/12/2022, atrial fibrillation not present.  02/21/2023: AV paced rhythm.  No further analysis.  Compared to previous, no significant change,  Pacemaker St. Jude PM 2240 Dual chamber pacemaker implant 02/05/2014   Remote dual-chamber pacemaker transmission 10/07/2022: AP 0%, VP 99%.  Longevity middle of service, 9 months.  Lead impedance and thresholds within normal limits.  AF on 09/22/22 for 7 days, last AF was 07/30/2021 Normal function.   Scheduled  In office pacemaker check 09/28/22 Single (S)/Dual (D)/BV: D. Presenting ASV, mode switched to VVI 09/22/22 at 12:03AM. Pacemaker dependant:  Yes. Underlying CHB. AP 60%, VP 100%  AMS Episodes 1.  Sustained atrial fibrillation since 09/22/2022, AT/AF burden 32%.  HVR 0.  Longevity 9 months. Magnet rate: >85%. Lead measurements: Stable. Histogram: Low (L)/normal (N)/high (H)  Normal. Patient activity Good.   Observations: Normal  pacemaker function. Changes: None.   Assessment     ICD-10-CM   1. Paroxysmal atrial fibrillation  I48.0 EKG 12-Lead    2. Atrioventricular block, complete  I44.2     3. Pacemaker St. Jude PM  2240 Dual chamber pacemaker implant  02/05/2014  Z95.0       No orders of the defined types were placed in this encounter.  Medications Discontinued During This Encounter  Medication Reason   Ascorbic Acid (VITAMIN C) 1000 MG tablet Duplicate    This patients CHA2DS2-VASc Score 3 (HTN, Vasc, Age)and yearly risk of stroke 3.2%.   Recommendations:   [name]  is a 77 y.o. male with hypertension, hyperlipidemia, coronary calcification by CT chest in 2015, unilateral kidney due to congenital absence of left kidney, complete heart block S/P St. Jude dual chamber pacemaker  implant  02/05/2014, paroxysmal atrial fibrillation, has been on Eliquis since 06/24/2019. He has history of known tolerance to statins, however on low dose of pravastatin.  He had been in persistent atrial fibrillation starting 09/22/2022, underwent direct-current cardioversion on 10/14/2022 and now presents for follow-up.  Paroxysmal atrial fibrillation [facility](HCC) Patient is maintaining sinus rhythm.  His last atrial fibrillation which is brief and spontaneously current sinus rhythm was in 2020.    He has noticed marked improvement in fatigue and overall wellbeing since cardioversion.  Atrioventricular block, complete [facility](HCC) He is pacemaker dependent.  Pacemaker St. Jude PM  2240 Dual chamber pacemaker implant  02/05/2014 Pacemaker is functioning normally.   Will continue to transmit remotely and will monitor for atrial fibrillation burden.  Otherwise he remained stable, on appropriate medical therapy, he continues to remain active.  I will see him back in 6 months or sooner if problems.   Clotilde DieterSabina Daphna Lafuente, DO 02/21/2023, 10:03 AM Office: [phone] Fax: [phone] Pager: [phone]

## 2023-04-07 DIAGNOSIS — Z45018 Encounter for adjustment and management of other part of cardiac pacemaker: Secondary | ICD-10-CM | POA: Diagnosis not present

## 2023-04-07 DIAGNOSIS — I442 Atrioventricular block, complete: Secondary | ICD-10-CM | POA: Diagnosis not present

## 2023-04-13 DIAGNOSIS — H353131 Nonexudative age-related macular degeneration, bilateral, early dry stage: Secondary | ICD-10-CM | POA: Diagnosis not present

## 2023-05-23 ENCOUNTER — Ambulatory Visit: Payer: Medicare Other | Admitting: Family Medicine

## 2023-07-07 ENCOUNTER — Other Ambulatory Visit: Payer: Self-pay | Admitting: Family

## 2023-07-07 DIAGNOSIS — I442 Atrioventricular block, complete: Secondary | ICD-10-CM | POA: Diagnosis not present

## 2023-07-07 DIAGNOSIS — I2510 Atherosclerotic heart disease of native coronary artery without angina pectoris: Secondary | ICD-10-CM

## 2023-07-07 DIAGNOSIS — Z45018 Encounter for adjustment and management of other part of cardiac pacemaker: Secondary | ICD-10-CM | POA: Diagnosis not present

## 2023-07-26 DIAGNOSIS — I77810 Thoracic aortic ectasia: Secondary | ICD-10-CM | POA: Diagnosis not present

## 2023-07-26 DIAGNOSIS — I4891 Unspecified atrial fibrillation: Secondary | ICD-10-CM | POA: Diagnosis not present

## 2023-07-26 DIAGNOSIS — F17211 Nicotine dependence, cigarettes, in remission: Secondary | ICD-10-CM | POA: Diagnosis not present

## 2023-07-26 DIAGNOSIS — Z6826 Body mass index (BMI) 26.0-26.9, adult: Secondary | ICD-10-CM | POA: Diagnosis not present

## 2023-07-26 DIAGNOSIS — Z950 Presence of cardiac pacemaker: Secondary | ICD-10-CM | POA: Diagnosis not present

## 2023-07-26 DIAGNOSIS — Z7901 Long term (current) use of anticoagulants: Secondary | ICD-10-CM | POA: Diagnosis not present

## 2023-07-26 DIAGNOSIS — E663 Overweight: Secondary | ICD-10-CM | POA: Diagnosis not present

## 2023-07-26 DIAGNOSIS — I10 Essential (primary) hypertension: Secondary | ICD-10-CM | POA: Diagnosis not present

## 2023-07-26 DIAGNOSIS — E785 Hyperlipidemia, unspecified: Secondary | ICD-10-CM | POA: Diagnosis not present

## 2023-07-26 DIAGNOSIS — D6869 Other thrombophilia: Secondary | ICD-10-CM | POA: Diagnosis not present

## 2023-07-26 DIAGNOSIS — Z008 Encounter for other general examination: Secondary | ICD-10-CM | POA: Diagnosis not present

## 2023-07-31 ENCOUNTER — Other Ambulatory Visit: Payer: Self-pay

## 2023-07-31 DIAGNOSIS — I480 Paroxysmal atrial fibrillation: Secondary | ICD-10-CM

## 2023-07-31 MED ORDER — APIXABAN 5 MG PO TABS: ORAL_TABLET | ORAL | 3 refills | Status: DC

## 2023-08-17 DIAGNOSIS — E7800 Pure hypercholesterolemia, unspecified: Secondary | ICD-10-CM | POA: Diagnosis not present

## 2023-08-17 DIAGNOSIS — Z7689 Persons encountering health services in other specified circumstances: Secondary | ICD-10-CM | POA: Diagnosis not present

## 2023-08-17 DIAGNOSIS — K219 Gastro-esophageal reflux disease without esophagitis: Secondary | ICD-10-CM | POA: Diagnosis not present

## 2023-08-17 DIAGNOSIS — R7309 Other abnormal glucose: Secondary | ICD-10-CM | POA: Diagnosis not present

## 2023-08-17 DIAGNOSIS — I480 Paroxysmal atrial fibrillation: Secondary | ICD-10-CM | POA: Diagnosis not present

## 2023-08-17 DIAGNOSIS — Z950 Presence of cardiac pacemaker: Secondary | ICD-10-CM | POA: Diagnosis not present

## 2023-08-20 NOTE — Progress Notes (Unsigned)
Cardiology Office Note:  .   Date:  08/22/2023  ID:  [name], DOB 10/02/1946, MRN [id number] PCP: Barbette Reichmann, MD  [facility] Providers Cardiologist:  Yates Decamp, MD    History of Present Illness: .   [name] is a 77 y.o. hypertension, hyperlipidemia, coronary calcification by CT chest in 2015, unilateral kidney due hydronephrosis of left kidney, complete heart block S/P St. Jude dual chamber pacemaker implant  02/05/2014, paroxysmal atrial fibrillation, has been on Eliquis since 06/24/2019, statin intolerance presently tolerating low dose of pravastatin.  This is an annual visit.  Discussed the use of AI scribe software for clinical note transcription with the patient, who gave verbal consent to proceed.  History of Present Illness   The patient, with a history of pacemaker placement for atrial fibrillation, presents with fatigue and leg weakness, particularly after climbing stairs. He suspects that these symptoms may be side effects of his pravastatin medication, as he experienced similar symptoms with previous statin use. He denies any chest pain or shortness of breath. He is active, golfing and walking regularly at work, and has not smoked in decades. He also reports an abdominal aortic aneurysm, but has no specific concerns or questions about it at this time. He is due to see his primary care provider, Dr. Marcello Fennel, later this week and recently had blood work done, the results of which are pending.        Review of Systems  Cardiovascular:  Negative for chest pain, dyspnea on exertion and leg swelling.   Risk Assessment/Calculations:    CHA2DS2-VASc Score = 3   This indicates a 3.2% annual risk of stroke. The patient's score is based upon: CHF History: 0 HTN History: 1 Diabetes History: 0 Stroke History: 0 Vascular Disease History: 0 Age Score: 2 Gender Score: 0             Lab Results  Component Value Date   CHOL 167 05/16/2022   HDL 42.60  05/16/2022   LDLCALC 93 04/26/2021   LDLDIRECT 87.0 05/16/2022   TRIG 222.0 (H) 05/16/2022   CHOLHDL 4 05/16/2022   Lab Results  Component Value Date   NA 142 10/13/2022   K 4.5 10/13/2022   CO2 24 10/13/2022   GLUCOSE 100 (H) 10/13/2022   BUN 18 10/13/2022   CREATININE 0.99 10/13/2022   CALCIUM 9.2 10/13/2022   GFR 80.72 05/16/2022   EGFR 79 10/13/2022   GFRNONAA 85 (L) 02/05/2014   Lab Results  Component Value Date   WBC 8.4 10/13/2022   HGB 13.9 10/13/2022   HCT 43.4 10/13/2022   MCV 88 10/13/2022   PLT 190 10/13/2022    Physical Exam:   VS:  BP 124/70 (BP Location: Left Arm, Patient Position: Sitting, Cuff Size: Normal)   Pulse 72   Resp 16   Ht 6\' 1"  (1.854 m)   Wt 205 lb 12.8 oz (93.4 kg)   SpO2 97%   BMI 27.15 kg/m    Wt Readings from Last 3 Encounters:  08/22/23 205 lb 12.8 oz (93.4 kg)  02/21/23 205 lb (93 kg)  11/01/22 209 lb (94.8 kg)     Physical Exam Neck:     Vascular: No carotid bruit or JVD.  Cardiovascular:     Rate and Rhythm: Normal rate and regular rhythm.     Pulses: Intact distal pulses.     Heart sounds: Normal heart sounds. No murmur heard.    No gallop.  Pulmonary:  Effort: Pulmonary effort is normal.     Breath sounds: Normal breath sounds.  Abdominal:     General: Bowel sounds are normal.     Palpations: Abdomen is soft.  Musculoskeletal:     Right lower leg: No edema.     Left lower leg: No edema.   Studies Reviewed: Marland Kitchen   EKG Interpretation Date/Time:  Tuesday August 22 2023 09:02:07 EDT Ventricular Rate:  70 PR Interval:    QRS Duration:  196 QT Interval:  494 QTC Calculation: 533 R Axis:   -85  Text Interpretation: EKG 08/22/2023: Underlying sinus rhythm with first-degree AV block, ventricularly paced rhythm.  Compared to 10/14/2022, no significant change. Previously AV paced rhythm. Confirmed by Delrae Rend [phone]) on 08/22/2023 9:11:20 AM    Remote dual-chamber pacemaker transmission 07/07/2023: Longevity 5  months.  AP 84 %, VP 99%.  No mode switches.  No further persistent atrial fibrillation of 7 days since 09/22/2022.  Lead impedance and thresholds within normal limits.  Normal pacemaker function.  ASSESSMENT AND PLAN: .      ICD-10-CM   1. Paroxysmal atrial fibrillation (HCC)  I48.0 EKG 12-Lead    Ambulatory referral to Cardiac Electrophysiology    2. Atrioventricular block, complete (HCC)  I44.2 Ambulatory referral to Cardiac Electrophysiology    3. Pacemaker St. Jude PM  2240 Dual chamber pacemaker implant  02/05/2014  Z95.0 Ambulatory referral to Cardiac Electrophysiology     CHA2DS2-VASc Score = 3 [CHF History: 0, HTN History: 1, Diabetes History: 0, Stroke History: 0, Vascular Disease History: 0, Age Score: 2, Gender Score: 0].  Therefore, the patient's annual risk of stroke is 3.2 %.     Assessment and Plan    Atrial Fibrillation with Pacemaker Pacemaker last checked a year ago. No AFib since November 2023. Patient reports feeling tired and lacking energy, particularly after physical exertion. Unclear if this is due to pacemaker settings or side effect of Pravastatin. -Schedule pacemaker check. -Adjust pacemaker settings if necessary during check. -Stop Pravastatin for three weeks to assess if symptoms improve. If symptoms improve, consider adjusting cholesterol management strategy.  Hyperlipidemia Patient on Pravastatin. Last cholesterol check in 2023 was normal (less than 100). Recent blood work pending. Patient reports potential side effects from Pravastatin. -If symptoms improve after stopping Pravastatin for three weeks, consider reducing Pravastatin dose and adding Ezetimibe. -If symptoms persist after stopping Pravastatin, resume medication as it is effectively managing cholesterol levels.  Follow-up in one year.      Patient with complete heart block and paroxysmal atrial fibrillation presenting for annual visit, he reports mild fatigue and achiness in his legs related to  statins.  But on further questioning it appears that he may have chronotropic incompetence, he may need device checked and sensitivity adjusted I will make a referral for EP.  With regard to paroxysmal atrial fibrillation, he has not had any further episodes by last pacemaker transmission a month ago since 2023 almost a year ago.  Continue anticoagulation in view of high CHA2DS2-VASc score.  Hyperlipidemia is as discussed above, he will hold his pravastatin now and come for device check.  If his symptoms of fatigue have improved by stopping pravastatin, he can reduce the dose to half and will add Zetia however if his symptoms have not resolved, he will go back on the pravastatin 40 mg daily as lipids are well-controlled with LDL goal <100 and will adjust pacemaker sensitivity.  Otherwise unremarkable physical examination. Signed,  Yates Decamp, MD, [facility] 08/22/2023, 9:28  AM

## 2023-08-22 ENCOUNTER — Encounter: Payer: Self-pay | Admitting: Cardiology

## 2023-08-22 ENCOUNTER — Ambulatory Visit: Payer: No Typology Code available for payment source | Attending: Cardiology | Admitting: Cardiology

## 2023-08-22 VITALS — BP 124/70 | HR 72 | Resp 16 | Ht 73.0 in | Wt 205.8 lb

## 2023-08-22 DIAGNOSIS — I480 Paroxysmal atrial fibrillation: Secondary | ICD-10-CM

## 2023-08-22 DIAGNOSIS — Z950 Presence of cardiac pacemaker: Secondary | ICD-10-CM | POA: Diagnosis not present

## 2023-08-22 DIAGNOSIS — I442 Atrioventricular block, complete: Secondary | ICD-10-CM

## 2023-08-22 NOTE — Patient Instructions (Addendum)
Medication Instructions:  Your physician recommends that you continue on your current medications as directed. Please refer to the Current Medication list given to you today.  *If you need a refill on your cardiac medications before your next appointment, please call your pharmacy*  Lab Work: If you have labs (blood work) drawn today and your tests are completely normal, you will receive your results only by: MyChart Message (if you have MyChart) OR A paper copy in the mail If you have any lab test that is abnormal or we need to change your treatment, we will call you to review the results.  Testing/Procedures: None ordered today.  Follow-Up: At [facility], you and your health needs are our priority.  As part of our continuing mission to provide you with exceptional heart care, we have created designated Provider Care Teams.  These Care Teams include your primary Cardiologist (physician) and Advanced Practice Providers (APPs -  Physician Assistants and Nurse Practitioners) who all work together to provide you with the care you need, when you need it.  We recommend signing up for the patient portal called "MyChart".  Sign up information is provided on this After Visit Summary.  MyChart is used to connect with patients for Virtual Visits (Telemedicine).  Patients are able to view lab/test results, encounter notes, upcoming appointments, etc.  Non-urgent messages can be sent to your provider as well.   To learn more about what you can do with MyChart, go to [email or web address].    Your next appointment:   1 year(s)  Provider:   Yates Decamp, MD     Other Instructions You have been referred to EP doctor for pacemaker check, patient of Dr. Jacinto Halim.

## 2023-08-24 DIAGNOSIS — D039 Melanoma in situ, unspecified: Secondary | ICD-10-CM | POA: Diagnosis not present

## 2023-08-24 DIAGNOSIS — Z789 Other specified health status: Secondary | ICD-10-CM | POA: Diagnosis not present

## 2023-08-24 DIAGNOSIS — E7800 Pure hypercholesterolemia, unspecified: Secondary | ICD-10-CM | POA: Diagnosis not present

## 2023-08-24 DIAGNOSIS — I480 Paroxysmal atrial fibrillation: Secondary | ICD-10-CM | POA: Diagnosis not present

## 2023-08-24 DIAGNOSIS — R7309 Other abnormal glucose: Secondary | ICD-10-CM | POA: Diagnosis not present

## 2023-08-24 DIAGNOSIS — I2510 Atherosclerotic heart disease of native coronary artery without angina pectoris: Secondary | ICD-10-CM | POA: Diagnosis not present

## 2023-08-24 DIAGNOSIS — N400 Enlarged prostate without lower urinary tract symptoms: Secondary | ICD-10-CM | POA: Diagnosis not present

## 2023-09-07 ENCOUNTER — Ambulatory Visit (INDEPENDENT_AMBULATORY_CARE_PROVIDER_SITE_OTHER): Payer: No Typology Code available for payment source

## 2023-09-07 DIAGNOSIS — I442 Atrioventricular block, complete: Secondary | ICD-10-CM | POA: Diagnosis not present

## 2023-09-07 LAB — CUP PACEART REMOTE DEVICE CHECK
Battery Remaining Longevity: 1 mo
Battery Remaining Percentage: 0.5 %
Battery Voltage: 2.59 V
Brady Statistic AP VP Percent: 0 %
Brady Statistic AP VS Percent: 0 %
Brady Statistic AS VP Percent: 0 %
Brady Statistic AS VS Percent: 0 %
Brady Statistic RA Percent Paced: 0 %
Brady Statistic RV Percent Paced: 99 %
Date Time Interrogation Session: 20241024034745
Implantable Lead Connection Status: 753985
Implantable Lead Connection Status: 753985
Implantable Lead Implant Date: 20150325
Implantable Lead Implant Date: 20150325
Implantable Lead Location: 753859
Implantable Lead Location: 753860
Implantable Lead Model: 5076
Implantable Pulse Generator Implant Date: 20150325
Lead Channel Impedance Value: 490 Ohm
Lead Channel Impedance Value: 540 Ohm
Lead Channel Pacing Threshold Amplitude: 0.5 V
Lead Channel Pacing Threshold Amplitude: 0.75 V
Lead Channel Pacing Threshold Pulse Width: 0.4 ms
Lead Channel Pacing Threshold Pulse Width: 0.4 ms
Lead Channel Sensing Intrinsic Amplitude: 11 mV
Lead Channel Sensing Intrinsic Amplitude: 4.7 mV
Lead Channel Setting Pacing Amplitude: 1 V
Lead Channel Setting Pacing Amplitude: 2 V
Lead Channel Setting Pacing Pulse Width: 0.4 ms
Lead Channel Setting Sensing Sensitivity: 4 mV
Pulse Gen Model: 2240
Pulse Gen Serial Number: 7596315

## 2023-09-26 ENCOUNTER — Encounter: Payer: Self-pay | Admitting: Cardiovascular Disease

## 2023-09-26 ENCOUNTER — Ambulatory Visit
Payer: No Typology Code available for payment source | Attending: Cardiovascular Disease | Admitting: Cardiovascular Disease

## 2023-09-26 VITALS — BP 126/84 | HR 70 | Ht 73.0 in | Wt 210.0 lb

## 2023-09-26 DIAGNOSIS — I2510 Atherosclerotic heart disease of native coronary artery without angina pectoris: Secondary | ICD-10-CM | POA: Diagnosis not present

## 2023-09-26 DIAGNOSIS — I480 Paroxysmal atrial fibrillation: Secondary | ICD-10-CM

## 2023-09-26 DIAGNOSIS — Z45018 Encounter for adjustment and management of other part of cardiac pacemaker: Secondary | ICD-10-CM | POA: Diagnosis not present

## 2023-09-26 DIAGNOSIS — I442 Atrioventricular block, complete: Secondary | ICD-10-CM

## 2023-09-26 DIAGNOSIS — Z950 Presence of cardiac pacemaker: Secondary | ICD-10-CM

## 2023-09-26 LAB — CUP PACEART INCLINIC DEVICE CHECK
Battery Remaining Longevity: 0 mo
Battery Voltage: 2.59 V
Brady Statistic RA Percent Paced: 0 %
Brady Statistic RV Percent Paced: 99.94 %
Date Time Interrogation Session: 20241112092439
Implantable Lead Connection Status: 753985
Implantable Lead Connection Status: 753985
Implantable Lead Implant Date: 20150325
Implantable Lead Implant Date: 20150325
Implantable Lead Location: 753859
Implantable Lead Location: 753860
Implantable Lead Model: 5076
Implantable Pulse Generator Implant Date: 20150325
Lead Channel Impedance Value: 460 Ohm
Lead Channel Impedance Value: 540 Ohm
Lead Channel Pacing Threshold Amplitude: 0 V
Lead Channel Pacing Threshold Amplitude: 0.75 V
Lead Channel Pacing Threshold Pulse Width: 0 ms
Lead Channel Pacing Threshold Pulse Width: 0.4 ms
Lead Channel Sensing Intrinsic Amplitude: 0 mV
Lead Channel Sensing Intrinsic Amplitude: 2.7 mV
Lead Channel Sensing Intrinsic Amplitude: 4.5 mV
Lead Channel Sensing Intrinsic Amplitude: 4.7 mV
Lead Channel Setting Pacing Amplitude: 1 V
Lead Channel Setting Pacing Amplitude: 2 V
Lead Channel Setting Pacing Pulse Width: 0.4 ms
Lead Channel Setting Sensing Sensitivity: 4 mV
Pulse Gen Model: 2240
Pulse Gen Serial Number: 7596315

## 2023-09-26 NOTE — Progress Notes (Signed)
Electrophysiology Office Note:    Date:  09/26/2023   ID:  [name], DOB Jul 24, 1946, MRN [id number]  PCP:  Barbette Reichmann, MD   [facility] Providers Cardiologist:  Yates Decamp, MD     Referring MD: Yates Decamp, MD   History of Present Illness:    [name] is a 77 y.o. male with a medical history significant for complete heart block, St. Jude pacemaker, persistent atrial fibrillation, coronary calcification on CT chest referred for device and arrhythmia management.     He has a Printmaker in 2015 for complete heart block.  He has a history of paroxysmal atrial fibrillation as well as been on Eliquis since 2015.  He underwent DC cardioversion in December 2023.  Device trends indicate that he maintained sinus rhythm until August or September of this past year.  he has no device related complaints -- no new tenderness, drainage, redness.   He notes that he has had increased fatigue and shortness of breath since late August or early September of this year.  EKG from October 2024 shows atrial fibrillation, and his device indicates that he has been in persistent atrial fibrillation correlating with onset of his symptoms.     Today, he is at baseline.  EKGs/Labs/Other Studies Reviewed Today:     Echocardiogram:  TTE March 2015 EF 55 to 60%.  Normal left atrial size.  TTE - Ordered Results pending  EKG:   EKG Interpretation Date/Time:  Tuesday September 26 2023 08:40:19 EST Ventricular Rate:  70 PR Interval:    QRS Duration:  210 QT Interval:  496 QTC Calculation: 535 R Axis:   -83  Text Interpretation: Atrial fibrillation and Ventricular-paced rhythm When compared with ECG of 22-Aug-2023 09:02, No significant change was found Confirmed by York Pellant [phone]) on 09/26/2023 9:37:23 AM     Physical Exam:    VS:  BP 126/84 (BP Location: Left Arm, Patient Position: Sitting, Cuff Size: Large)   Pulse 70   Ht 6\' 1"  (1.854 m)    Wt 210 lb (95.3 kg)   SpO2 98%   BMI 27.71 kg/m     Wt Readings from Last 3 Encounters:  09/26/23 210 lb (95.3 kg)  08/22/23 205 lb 12.8 oz (93.4 kg)  02/21/23 205 lb (93 kg)     GEN: Well nourished, well developed in no acute distress CARDIAC: RRR, no murmurs, rubs, gallops The device site is normal -- no tenderness, edema, drainage, redness, threatened erosion.  RESPIRATORY:  Normal work of breathing MUSCULOSKELETAL: no edema    ASSESSMENT & PLAN:     Complete heart block  He is device-dependent today Schedule TTE to assess EF with high RV pacing burden  St. Jude dual-chamber pacemaker Dual chamber pacemaker placed in 2015 At [facility] -we will schedule generator change.  I explained the risks of the pacemaker generator change including infection and bleeding. I reviewed today's interrogation.  See Paceart for details   Persistent atrial fibrillation Suspect this may be the cause of his fatigue and shortness of breath We will schedule cardioversion to occur 3 weeks after resumption of anticoagulation following generator change We discussed options for long-term management and made the joint decision to proceed with atrial fibrillation ablation  We discussed the indication, rationale, logistics, anticipated benefits, and potential risks of the ablation procedure including but not limited to -- bleed at the groin access site, chest pain, damage to nearby organs such as the diaphragm, lungs, or esophagus, need  for a drainage tube, or prolonged hospitalization. I explained that the risk for stroke, heart attack, need for open chest surgery, or even death is very low but not zero. he  expressed understanding and wishes to proceed.   Secondary hypercoagulable state CHA2DS2-VASc score is 4 Continue apixaban 5 mg twice daily  History of coronary calcium on CT Would prefer to avoid flecainide.    Signed, Maurice Small, MD  09/26/2023 9:38 AM    [facility]

## 2023-09-26 NOTE — Patient Instructions (Signed)
Medication Instructions:  Your physician recommends that you continue on your current medications as directed. Please refer to the Current Medication list given to you today. *If you need a refill on your cardiac medications before your next appointment, please call your pharmacy*    Testing/Procedures: Echocardiogram Your physician has requested that you have an echocardiogram. Echocardiography is a painless test that uses sound waves to create images of your heart. It provides your doctor with information about the size and shape of your heart and how well your heart's chambers and valves are working. This procedure takes approximately one hour. There are no restrictions for this procedure. Please do NOT wear cologne, perfume, aftershave, or lotions (deodorant is allowed). Please arrive 15 minutes prior to your appointment time.  Please note: We ask at that you not bring children with you during ultrasound (echo/ vascular) testing. Due to room size and safety concerns, children are not allowed in the ultrasound rooms during exams. Our front office staff cannot provide observation of children in our lobby area while testing is being conducted. An adult accompanying a patient to their appointment will only be allowed in the ultrasound room at the discretion of the ultrasound technician under special circumstances. We apologize for any inconvenience.  Cardioversion - will schedule after your generator change Your physician has recommended that you have a Cardioversion (DCCV). Electrical Cardioversion uses a jolt of electricity to your heart either through paddles or wired patches attached to your chest. This is a controlled, usually prescheduled, procedure. Defibrillation is done under light anesthesia in the hospital, and you usually go home the day of the procedure. This is done to get your heart back into a normal rhythm. You are not awake for the procedure. Please see the instruction sheet given to  you today.  Generator change - but possible upgrade depending on results of echocardiogram, we will contact you after Dr Nelly Laurence has reviewed this  Atrial Fibrillation Ablation  Your physician has recommended that you have an ablation. Catheter ablation is a medical procedure used to treat some cardiac arrhythmias (irregular heartbeats). During catheter ablation, a long, thin, flexible tube is put into a blood vessel in your groin (upper thigh), or neck. This tube is called an ablation catheter. It is then guided to your heart through the blood vessel. Radio frequency waves destroy small areas of heart tissue where abnormal heartbeats may cause an arrhythmia to start. Please see the instruction sheet given to you today.   Follow-Up: At [facility], you and your health needs are our priority.  As part of our continuing mission to provide you with exceptional heart care, we have created designated Provider Care Teams.  These Care Teams include your primary Cardiologist (physician) and Advanced Practice Providers (APPs -  Physician Assistants and Nurse Practitioners) who all work together to provide you with the care you need, when you need it.  We recommend signing up for the patient portal called "MyChart".  Sign up information is provided on this After Visit Summary.  MyChart is used to connect with patients for Virtual Visits (Telemedicine).  Patients are able to view lab/test results, encounter notes, upcoming appointments, etc.  Non-urgent messages can be sent to your provider as well.   To learn more about what you can do with MyChart, go to [email or web address].    Your next appointment:   We will schedule follow up prior to your ablation   Provider:   York Pellant, MD

## 2023-09-26 NOTE — Progress Notes (Signed)
Remote pacemaker transmission.   

## 2023-09-26 NOTE — H&P (View-Only) (Signed)
 Electrophysiology Office Note:    Date:  09/26/2023   ID:  [name], DOB Jul 24, 1946, MRN [id number]  PCP:  Jose Reichmann, MD   [facility] Providers Cardiologist:  Yates Decamp, MD     Referring MD: Yates Decamp, MD   History of Present Illness:    [name] is a 77 y.o. male with a medical history significant for complete heart block, St. Jude pacemaker, persistent atrial fibrillation, coronary calcification on CT chest referred for device and arrhythmia management.     He has a Printmaker in 2015 for complete heart block.  He has a history of paroxysmal atrial fibrillation as well as been on Eliquis since 2015.  He underwent DC cardioversion in December 2023.  Device trends indicate that he maintained sinus rhythm until August or September of this past year.  he has no device related complaints -- no new tenderness, drainage, redness.   He notes that he has had increased fatigue and shortness of breath since late August or early September of this year.  EKG from October 2024 shows atrial fibrillation, and his device indicates that he has been in persistent atrial fibrillation correlating with onset of his symptoms.     Today, he is at baseline.  EKGs/Labs/Other Studies Reviewed Today:     Echocardiogram:  TTE March 2015 EF 55 to 60%.  Normal left atrial size.  TTE - Ordered Results pending  EKG:   EKG Interpretation Date/Time:  Tuesday September 26 2023 08:40:19 EST Ventricular Rate:  70 PR Interval:    QRS Duration:  210 QT Interval:  496 QTC Calculation: 535 R Axis:   -83  Text Interpretation: Atrial fibrillation and Ventricular-paced rhythm When compared with ECG of 22-Aug-2023 09:02, No significant change was found Confirmed by York Pellant [phone]) on 09/26/2023 9:37:23 AM     Physical Exam:    VS:  BP 126/84 (BP Location: Left Arm, Patient Position: Sitting, Cuff Size: Large)   Pulse 70   Ht 6\' 1"  (1.854 m)    Wt 210 lb (95.3 kg)   SpO2 98%   BMI 27.71 kg/m     Wt Readings from Last 3 Encounters:  09/26/23 210 lb (95.3 kg)  08/22/23 205 lb 12.8 oz (93.4 kg)  02/21/23 205 lb (93 kg)     GEN: Well nourished, well developed in no acute distress CARDIAC: RRR, no murmurs, rubs, gallops The device site is normal -- no tenderness, edema, drainage, redness, threatened erosion.  RESPIRATORY:  Normal work of breathing MUSCULOSKELETAL: no edema    ASSESSMENT & PLAN:     Complete heart block  He is device-dependent today Schedule TTE to assess EF with high RV pacing burden  St. Jude dual-chamber pacemaker Dual chamber pacemaker placed in 2015 At [facility] -we will schedule generator change.  I explained the risks of the pacemaker generator change including infection and bleeding. I reviewed today's interrogation.  See Paceart for details   Persistent atrial fibrillation Suspect this may be the cause of his fatigue and shortness of breath We will schedule cardioversion to occur 3 weeks after resumption of anticoagulation following generator change We discussed options for long-term management and made the joint decision to proceed with atrial fibrillation ablation  We discussed the indication, rationale, logistics, anticipated benefits, and potential risks of the ablation procedure including but not limited to -- bleed at the groin access site, chest pain, damage to nearby organs such as the diaphragm, lungs, or esophagus, need  for a drainage tube, or prolonged hospitalization. I explained that the risk for stroke, heart attack, need for open chest surgery, or even death is very low but not zero. he  expressed understanding and wishes to proceed.   Secondary hypercoagulable state CHA2DS2-VASc score is 4 Continue apixaban 5 mg twice daily  History of coronary calcium on CT Would prefer to avoid flecainide.    Signed, Maurice Small, MD  09/26/2023 9:38 AM    [facility]

## 2023-09-28 ENCOUNTER — Ambulatory Visit (HOSPITAL_COMMUNITY): Payer: No Typology Code available for payment source | Attending: Cardiology

## 2023-09-28 DIAGNOSIS — I77810 Thoracic aortic ectasia: Secondary | ICD-10-CM | POA: Insufficient documentation

## 2023-09-28 DIAGNOSIS — Z950 Presence of cardiac pacemaker: Secondary | ICD-10-CM | POA: Insufficient documentation

## 2023-09-28 DIAGNOSIS — I2510 Atherosclerotic heart disease of native coronary artery without angina pectoris: Secondary | ICD-10-CM | POA: Diagnosis not present

## 2023-09-28 DIAGNOSIS — Z45018 Encounter for adjustment and management of other part of cardiac pacemaker: Secondary | ICD-10-CM | POA: Insufficient documentation

## 2023-09-28 DIAGNOSIS — I480 Paroxysmal atrial fibrillation: Secondary | ICD-10-CM | POA: Diagnosis not present

## 2023-09-28 DIAGNOSIS — I442 Atrioventricular block, complete: Secondary | ICD-10-CM | POA: Diagnosis not present

## 2023-09-28 LAB — ECHOCARDIOGRAM COMPLETE
Est EF: 50
S' Lateral: 2.75 cm

## 2023-10-05 ENCOUNTER — Telehealth: Payer: Self-pay

## 2023-10-05 DIAGNOSIS — I442 Atrioventricular block, complete: Secondary | ICD-10-CM

## 2023-10-05 DIAGNOSIS — Z950 Presence of cardiac pacemaker: Secondary | ICD-10-CM

## 2023-10-05 NOTE — Telephone Encounter (Signed)
Spoke with patient and reviewed instruction letter for upcoming pacemaker generator change. Labs to be completed by 10/13/23 at our [address] office, orders placed. No questions at this time

## 2023-10-05 NOTE — Telephone Encounter (Signed)
Spoke with patient, reviewed echo results and confirmed gen change on 12/9 with labs to be completed by 11/29. Will send instruction letter thru MyChart. No further needs at this time

## 2023-10-09 ENCOUNTER — Other Ambulatory Visit (HOSPITAL_BASED_OUTPATIENT_CLINIC_OR_DEPARTMENT_OTHER): Payer: Self-pay | Admitting: Cardiovascular Disease

## 2023-10-10 LAB — BASIC METABOLIC PANEL
BUN/Creatinine Ratio: 15 (ref 10–24)
BUN: 14 mg/dL (ref 8–27)
CO2: 24 mmol/L (ref 20–29)
Calcium: 9.1 mg/dL (ref 8.6–10.2)
Chloride: 105 mmol/L (ref 96–106)
Creatinine, Ser: 0.93 mg/dL (ref 0.76–1.27)
Glucose: 109 mg/dL — ABNORMAL HIGH (ref 70–99)
Potassium: 4.5 mmol/L (ref 3.5–5.2)
Sodium: 141 mmol/L (ref 134–144)
eGFR: 85 mL/min/{1.73_m2} (ref >59)

## 2023-10-10 LAB — CBC
Hematocrit: 46.6 % (ref 37.5–51.0)
Hemoglobin: 14.9 g/dL (ref 13.0–17.7)
MCH: 28.7 pg (ref 26.6–33.0)
MCHC: 32 g/dL (ref 31.5–35.7)
MCV: 90 fL (ref 79–97)
Platelets: 188 10*3/uL (ref 150–450)
RBC: 5.2 x10E6/uL (ref 4.14–5.80)
RDW: 13.5 % (ref 11.6–15.4)
WBC: 6.8 10*3/uL (ref 3.4–10.8)

## 2023-10-20 NOTE — Pre-Procedure Instructions (Signed)
Instructed patient on the following items: Arrival time 1200 Nothing to eat or drink after midnight No meds AM of procedure Responsible person to drive you home and stay with you for 24 hrs Wash with special soap night before and morning of procedure If on anti-coagulant drug instructions Eliquis- last dose tonight 12/6.

## 2023-10-23 ENCOUNTER — Encounter (HOSPITAL_COMMUNITY)
Admission: RE | Disposition: A | Discharge status: Home / Self Care | Payer: Self-pay | Source: Home / Self Care | Attending: Cardiovascular Disease

## 2023-10-23 ENCOUNTER — Other Ambulatory Visit: Payer: Self-pay

## 2023-10-23 ENCOUNTER — Ambulatory Visit (HOSPITAL_COMMUNITY)
Admission: RE | Admit: 2023-10-23 | Discharge: 2023-10-23 | Disposition: A | Discharge status: Home / Self Care | Payer: No Typology Code available for payment source | Attending: Cardiovascular Disease | Admitting: Cardiovascular Disease

## 2023-10-23 DIAGNOSIS — I442 Atrioventricular block, complete: Secondary | ICD-10-CM | POA: Diagnosis not present

## 2023-10-23 DIAGNOSIS — Z45010 Encounter for checking and testing of cardiac pacemaker pulse generator [battery]: Secondary | ICD-10-CM | POA: Diagnosis not present

## 2023-10-23 DIAGNOSIS — I4819 Other persistent atrial fibrillation: Secondary | ICD-10-CM | POA: Insufficient documentation

## 2023-10-23 DIAGNOSIS — Z7901 Long term (current) use of anticoagulants: Secondary | ICD-10-CM | POA: Diagnosis not present

## 2023-10-23 DIAGNOSIS — D6869 Other thrombophilia: Secondary | ICD-10-CM | POA: Insufficient documentation

## 2023-10-23 DIAGNOSIS — I480 Paroxysmal atrial fibrillation: Secondary | ICD-10-CM

## 2023-10-23 HISTORY — PX: PPM GENERATOR CHANGEOUT: EP1233

## 2023-10-23 SURGERY — PPM GENERATOR CHANGEOUT
Anesthesia: LOCAL

## 2023-10-23 MED ORDER — APIXABAN 5 MG PO TABS: ORAL_TABLET | ORAL | Status: DC

## 2023-10-23 MED ORDER — LIDOCAINE HCL (PF) 1 % IJ SOLN
INTRAMUSCULAR | Status: DC | PRN
  Administered 2023-10-23: 60 mL

## 2023-10-23 MED ORDER — SODIUM CHLORIDE 0.9 % IV SOLN
INTRAVENOUS | Status: AC
  Administered 2023-10-23: 80 mg
  Filled 2023-10-23: qty 2

## 2023-10-23 MED ORDER — ONDANSETRON HCL 4 MG/2ML IJ SOLN: 4.0000 mg | Freq: Four times a day (QID) | INTRAMUSCULAR | Status: DC | PRN

## 2023-10-23 MED ORDER — SODIUM CHLORIDE 0.9 % IV SOLN: 80.0000 mg | INTRAVENOUS | Status: AC

## 2023-10-23 MED ORDER — FENTANYL CITRATE (PF) 100 MCG/2ML IJ SOLN
INTRAMUSCULAR | Status: DC | PRN
  Administered 2023-10-23 (×2): 25 ug via INTRAVENOUS

## 2023-10-23 MED ORDER — ACETAMINOPHEN 325 MG PO TABS: 325.0000 mg | ORAL_TABLET | ORAL | Status: DC | PRN

## 2023-10-23 MED ORDER — CHLORHEXIDINE GLUCONATE 4 % EX SOLN: 4.0000 | Freq: Once | CUTANEOUS | Status: DC

## 2023-10-23 MED ORDER — FENTANYL CITRATE (PF) 100 MCG/2ML IJ SOLN
INTRAMUSCULAR | Status: AC
  Filled 2023-10-23: qty 2

## 2023-10-23 MED ORDER — POVIDONE-IODINE 10 % EX SWAB
2.0000 | Freq: Once | CUTANEOUS | Status: AC
  Administered 2023-10-23: 2 via TOPICAL

## 2023-10-23 MED ORDER — LIDOCAINE HCL (PF) 1 % IJ SOLN
INTRAMUSCULAR | Status: AC
  Filled 2023-10-23: qty 60

## 2023-10-23 MED ORDER — CEFAZOLIN SODIUM-DEXTROSE 2-4 GM/100ML-% IV SOLN: 2.0000 g | INTRAVENOUS | Status: AC

## 2023-10-23 MED ORDER — CEFAZOLIN SODIUM-DEXTROSE 2-4 GM/100ML-% IV SOLN
INTRAVENOUS | Status: AC
  Administered 2023-10-23: 2 g via INTRAVENOUS
  Filled 2023-10-23: qty 100

## 2023-10-23 MED ORDER — MIDAZOLAM HCL 5 MG/5ML IJ SOLN
INTRAMUSCULAR | Status: DC | PRN
  Administered 2023-10-23 (×2): 1 mg via INTRAVENOUS

## 2023-10-23 MED ORDER — SODIUM CHLORIDE 0.9 % IV SOLN
INTRAVENOUS | Status: DC
Start: 2023-10-23 — End: 2023-10-23

## 2023-10-23 MED ORDER — MIDAZOLAM HCL 5 MG/5ML IJ SOLN
INTRAMUSCULAR | Status: AC
  Filled 2023-10-23: qty 5

## 2023-10-23 SURGICAL SUPPLY — 8 items
CABLE SURGICAL S-101-97-12 (CABLE) ×2 IMPLANT
DEVICE DISSECT PLASMABLAD 3.0S (MISCELLANEOUS) IMPLANT
PACEMAKER ASSURITY DR-RF (Pacemaker) IMPLANT
PAD DEFIB RADIO PHYSIO CONN (PAD) ×2 IMPLANT
PLASMABLADE 3.0S (MISCELLANEOUS) ×1
POUCH AIGIS-R ANTIBACT PPM (Mesh General) ×1 IMPLANT
POUCH AIGIS-R ANTIBACT PPM MED (Mesh General) IMPLANT
TRAY PACEMAKER INSERTION (PACKS) ×2 IMPLANT

## 2023-10-23 NOTE — Discharge Instructions (Signed)

## 2023-10-23 NOTE — Interval H&P Note (Signed)
History and Physical Interval Note:  10/23/2023 2:56 PM  [name]  has presented today for surgery, with the diagnosis of ERI.  The various methods of treatment have been discussed with the patient and family. After consideration of risks, benefits and other options for treatment, the patient has consented to  Procedure(s): PPM GENERATOR CHANGEOUT (N/A) as a surgical intervention.  The patient's history has been reviewed, patient examined, no change in status, stable for surgery.  I have reviewed the patient's chart and labs.  Questions were answered to the patient's satisfaction.     Roberts Gaudy Shalisha Clausing

## 2023-10-24 ENCOUNTER — Encounter (HOSPITAL_COMMUNITY): Payer: Self-pay | Admitting: Cardiovascular Disease

## 2023-10-25 ENCOUNTER — Telehealth: Payer: Self-pay

## 2023-10-25 NOTE — Telephone Encounter (Signed)
Follow-up after same day discharge: Implant date: 10/23/2023 MD: Mealor Device: ppm sjm  Location: L chest   Wound check visit: yes 90 day MD follow-up: yes  Remote Transmission received:not yet pt is not home   Dressing/sling removed: yes  Confirm OAC restart on: yes   Please continue to monitor your cardiac device site for redness, swelling, and drainage. Call the device clinic at [phone] if you experience these symptoms, fever/chills, or have questions about your device.   Remote monitoring is used to monitor your cardiac device from home. This monitoring is scheduled every 91 days by our office. It allows [place] to keep an eye on the functioning of your device to ensure it is working properly.

## 2023-10-26 ENCOUNTER — Telehealth: Payer: Self-pay

## 2023-10-26 NOTE — Telephone Encounter (Signed)
I called [employer] tech support with the pt and they are sending him a new cell adapter for his monitor. Once he receive the adapter he will be able to send the manual transmission.

## 2023-10-26 NOTE — Telephone Encounter (Signed)
Spoke with pt he is at work and not able to send transmission. Pt states he tried to do it but he didn't know what he was doing. Pt will be home between 4-5pm and I have given him DC direct number and [employer] tech support in case its after 5pm. I have let pt know we will check in the morning to see if we receive it

## 2023-10-27 ENCOUNTER — Telehealth: Payer: Self-pay

## 2023-10-27 DIAGNOSIS — I480 Paroxysmal atrial fibrillation: Secondary | ICD-10-CM

## 2023-10-27 NOTE — Telephone Encounter (Signed)
Spoke with patient, scheduled AF ablation on 11/22/22 at 1230 with Dr Nelly Laurence. Labs to be completed on 11/06/23 and CT scheduled on 11/09/23.

## 2023-11-01 ENCOUNTER — Ambulatory Visit: Payer: No Typology Code available for payment source | Admitting: Cardiology

## 2023-11-06 DIAGNOSIS — I480 Paroxysmal atrial fibrillation: Secondary | ICD-10-CM | POA: Diagnosis not present

## 2023-11-07 LAB — CBC
Hematocrit: 43.7 % (ref 37.5–51.0)
Hemoglobin: 14.4 g/dL (ref 13.0–17.7)
MCH: 28.7 pg (ref 26.6–33.0)
MCHC: 33 g/dL (ref 31.5–35.7)
MCV: 87 fL (ref 79–97)
Platelets: 189 10*3/uL (ref 150–450)
RBC: 5.01 x10E6/uL (ref 4.14–5.80)
RDW: 13.7 % (ref 11.6–15.4)
WBC: 7.8 10*3/uL (ref 3.4–10.8)

## 2023-11-09 ENCOUNTER — Ambulatory Visit (HOSPITAL_COMMUNITY)
Admission: RE | Admit: 2023-11-09 | Discharge: 2023-11-09 | Disposition: A | Discharge status: Home / Self Care | Payer: No Typology Code available for payment source | Source: Ambulatory Visit | Attending: Cardiovascular Disease | Admitting: Cardiovascular Disease

## 2023-11-09 ENCOUNTER — Ambulatory Visit: Payer: No Typology Code available for payment source | Attending: Internal Medicine

## 2023-11-09 DIAGNOSIS — I480 Paroxysmal atrial fibrillation: Secondary | ICD-10-CM

## 2023-11-09 DIAGNOSIS — I517 Cardiomegaly: Secondary | ICD-10-CM | POA: Diagnosis not present

## 2023-11-09 LAB — CUP PACEART INCLINIC DEVICE CHECK
Battery Remaining Longevity: 132 mo
Battery Voltage: 3.05 V
Brady Statistic RA Percent Paced: 0 %
Brady Statistic RV Percent Paced: 99.89 %
Date Time Interrogation Session: 20241226124501
Implantable Lead Connection Status: 753985
Implantable Lead Connection Status: 753985
Implantable Lead Implant Date: 20150325
Implantable Lead Implant Date: 20150325
Implantable Lead Location: 753859
Implantable Lead Location: 753860
Implantable Lead Model: 5076
Implantable Pulse Generator Implant Date: 20241209
Lead Channel Impedance Value: 450 Ohm
Lead Channel Impedance Value: 487.5 Ohm
Lead Channel Pacing Threshold Amplitude: 0.875 V
Lead Channel Pacing Threshold Pulse Width: 0.4 ms
Lead Channel Sensing Intrinsic Amplitude: 2.4 mV
Lead Channel Setting Pacing Amplitude: 1.125
Lead Channel Setting Pacing Amplitude: 2 V
Lead Channel Setting Pacing Pulse Width: 0.4 ms
Lead Channel Setting Sensing Sensitivity: 4 mV
Pulse Gen Model: 2272
Pulse Gen Serial Number: 8237607

## 2023-11-09 MED ORDER — IOHEXOL 350 MG/ML SOLN
95.0000 mL | Freq: Once | INTRAVENOUS | Status: AC | PRN
  Administered 2023-11-09: 95 mL via INTRAVENOUS

## 2023-11-09 MED ORDER — IOHEXOL 350 MG/ML SOLN: 100.0000 mL | Freq: Once | INTRAVENOUS | Status: DC | PRN

## 2023-11-09 NOTE — Progress Notes (Signed)
Wound check appointment. Steri-strips removed. Wound without redness or edema. Incision edges approximated, wound well healed. Normal device function. Thresholds, sensing, and impedances consistent with implant measurements. Device programmed at chronic ouputs. Histogram distribution appropriate for patient and level of activity. AT/AF burden 100%. On OAC, pending ablation 11/2023. No high ventricular rates noted. Patient educated about wound care, arm mobility, lifting restrictions. ROV in 3 months with implanting physician.

## 2023-11-09 NOTE — Patient Instructions (Signed)

## 2023-11-22 NOTE — Pre-Procedure Instructions (Signed)
 Instructed patient on the following items: Arrival time 1000 Nothing to eat or drink after midnight No meds AM of procedure Responsible person to drive you home and stay with you for 24 hrs  Have you missed any doses of anti-coagulant Eliquis- takes twice a day, hasn't missed any doses.  Don't take dose in the morning.

## 2023-11-23 ENCOUNTER — Ambulatory Visit (HOSPITAL_COMMUNITY): Payer: No Typology Code available for payment source | Admitting: Anesthesiology

## 2023-11-23 ENCOUNTER — Ambulatory Visit (HOSPITAL_COMMUNITY)
Admission: RE | Admit: 2023-11-23 | Discharge: 2023-11-23 | Disposition: A | Discharge status: Home / Self Care | Payer: No Typology Code available for payment source | Attending: Cardiovascular Disease | Admitting: Cardiovascular Disease

## 2023-11-23 ENCOUNTER — Other Ambulatory Visit: Payer: Self-pay

## 2023-11-23 ENCOUNTER — Ambulatory Visit (HOSPITAL_COMMUNITY)
Admission: RE | Disposition: A | Discharge status: Home / Self Care | Payer: Self-pay | Source: Home / Self Care | Attending: Cardiovascular Disease

## 2023-11-23 DIAGNOSIS — Z7901 Long term (current) use of anticoagulants: Secondary | ICD-10-CM | POA: Diagnosis not present

## 2023-11-23 DIAGNOSIS — I4891 Unspecified atrial fibrillation: Secondary | ICD-10-CM | POA: Diagnosis not present

## 2023-11-23 DIAGNOSIS — I10 Essential (primary) hypertension: Secondary | ICD-10-CM | POA: Insufficient documentation

## 2023-11-23 DIAGNOSIS — Z950 Presence of cardiac pacemaker: Secondary | ICD-10-CM | POA: Insufficient documentation

## 2023-11-23 DIAGNOSIS — I2510 Atherosclerotic heart disease of native coronary artery without angina pectoris: Secondary | ICD-10-CM | POA: Diagnosis not present

## 2023-11-23 DIAGNOSIS — D6869 Other thrombophilia: Secondary | ICD-10-CM | POA: Diagnosis not present

## 2023-11-23 DIAGNOSIS — I480 Paroxysmal atrial fibrillation: Secondary | ICD-10-CM | POA: Diagnosis not present

## 2023-11-23 DIAGNOSIS — Z87891 Personal history of nicotine dependence: Secondary | ICD-10-CM | POA: Diagnosis not present

## 2023-11-23 DIAGNOSIS — I442 Atrioventricular block, complete: Secondary | ICD-10-CM | POA: Insufficient documentation

## 2023-11-23 DIAGNOSIS — I4819 Other persistent atrial fibrillation: Secondary | ICD-10-CM

## 2023-11-23 HISTORY — PX: ATRIAL FIBRILLATION ABLATION: EP1191

## 2023-11-23 LAB — BASIC METABOLIC PANEL
Anion gap: 9 (ref 5–15)
BUN: 13 mg/dL (ref 8–23)
CO2: 24 mmol/L (ref 22–32)
Calcium: 9 mg/dL (ref 8.9–10.3)
Chloride: 106 mmol/L (ref 98–111)
Creatinine, Ser: 0.81 mg/dL (ref 0.61–1.24)
GFR, Estimated: >60 mL/min (ref >60)
Glucose, Bld: 103 mg/dL — ABNORMAL HIGH (ref 70–99)
Potassium: 4.2 mmol/L (ref 3.5–5.1)
Sodium: 139 mmol/L (ref 135–145)

## 2023-11-23 LAB — POCT ACTIVATED CLOTTING TIME: Activated Clotting Time: 348 s

## 2023-11-23 SURGERY — ATRIAL FIBRILLATION ABLATION
Anesthesia: General

## 2023-11-23 MED ORDER — ONDANSETRON HCL 4 MG/2ML IJ SOLN: 4.0000 mg | Freq: Four times a day (QID) | INTRAMUSCULAR | Status: DC | PRN

## 2023-11-23 MED ORDER — SODIUM CHLORIDE 0.9% FLUSH
3.0000 mL | INTRAVENOUS | Status: DC | PRN
Start: 2023-11-23 — End: 2023-11-24

## 2023-11-23 MED ORDER — ROCURONIUM BROMIDE 10 MG/ML (PF) SYRINGE
PREFILLED_SYRINGE | INTRAVENOUS | Status: DC | PRN
  Administered 2023-11-23: 60 mg via INTRAVENOUS
  Administered 2023-11-23: 10 mg via INTRAVENOUS

## 2023-11-23 MED ORDER — SUGAMMADEX SODIUM 200 MG/2ML IV SOLN
INTRAVENOUS | Status: DC | PRN
  Administered 2023-11-23: 200 mg via INTRAVENOUS

## 2023-11-23 MED ORDER — HEPARIN SODIUM (PORCINE) 1000 UNIT/ML IJ SOLN
INTRAMUSCULAR | Status: DC | PRN
  Administered 2023-11-23: 17000 [IU] via INTRAVENOUS

## 2023-11-23 MED ORDER — PHENYLEPHRINE HCL-NACL 20-0.9 MG/250ML-% IV SOLN
INTRAVENOUS | Status: DC | PRN
  Administered 2023-11-23: 50 ug/min via INTRAVENOUS

## 2023-11-23 MED ORDER — PROPOFOL 10 MG/ML IV BOLUS
INTRAVENOUS | Status: DC | PRN
  Administered 2023-11-23: 130 mg via INTRAVENOUS

## 2023-11-23 MED ORDER — LIDOCAINE 2% (20 MG/ML) 5 ML SYRINGE
INTRAMUSCULAR | Status: DC | PRN
  Administered 2023-11-23: 60 mg via INTRAVENOUS

## 2023-11-23 MED ORDER — CEFAZOLIN SODIUM-DEXTROSE 2-3 GM-%(50ML) IV SOLR
INTRAVENOUS | Status: DC | PRN
  Administered 2023-11-23: 2 g via INTRAVENOUS

## 2023-11-23 MED ORDER — CEFAZOLIN SODIUM-DEXTROSE 2-4 GM/100ML-% IV SOLN
INTRAVENOUS | Status: AC
  Filled 2023-11-23: qty 100

## 2023-11-23 MED ORDER — ATROPINE SULFATE 0.4 MG/ML IV SOLN
INTRAVENOUS | Status: DC | PRN
  Administered 2023-11-23: 1 mg via INTRAVENOUS

## 2023-11-23 MED ORDER — FENTANYL CITRATE (PF) 100 MCG/2ML IJ SOLN
INTRAMUSCULAR | Status: AC
  Filled 2023-11-23: qty 2

## 2023-11-23 MED ORDER — HEPARIN (PORCINE) IN NACL 1000-0.9 UT/500ML-% IV SOLN
INTRAVENOUS | Status: DC | PRN
  Administered 2023-11-23 (×3): 500 mL

## 2023-11-23 MED ORDER — SODIUM CHLORIDE 0.9 % IV SOLN: INTRAVENOUS | Status: DC

## 2023-11-23 MED ORDER — DEXAMETHASONE SODIUM PHOSPHATE 10 MG/ML IJ SOLN
INTRAMUSCULAR | Status: DC | PRN
  Administered 2023-11-23: 10 mg via INTRAVENOUS

## 2023-11-23 MED ORDER — ACETAMINOPHEN 500 MG PO TABS
1000.0000 mg | ORAL_TABLET | Freq: Once | ORAL | Status: AC
  Administered 2023-11-23: 1000 mg via ORAL
  Filled 2023-11-23: qty 2

## 2023-11-23 MED ORDER — SODIUM CHLORIDE 0.9% FLUSH: 3.0000 mL | Freq: Two times a day (BID) | INTRAVENOUS | Status: DC

## 2023-11-23 MED ORDER — FENTANYL CITRATE (PF) 250 MCG/5ML IJ SOLN
INTRAMUSCULAR | Status: DC | PRN
  Administered 2023-11-23: 50 ug via INTRAVENOUS

## 2023-11-23 MED ORDER — ACETAMINOPHEN 325 MG PO TABS: 650.0000 mg | ORAL_TABLET | ORAL | Status: DC | PRN

## 2023-11-23 MED ORDER — PROTAMINE SULFATE 10 MG/ML IV SOLN
INTRAVENOUS | Status: DC | PRN
  Administered 2023-11-23: 50 mg via INTRAVENOUS

## 2023-11-23 MED ORDER — ONDANSETRON HCL 4 MG/2ML IJ SOLN
INTRAMUSCULAR | Status: DC | PRN
  Administered 2023-11-23: 4 mg via INTRAVENOUS

## 2023-11-23 MED ORDER — ATROPINE SULFATE 1 MG/10ML IJ SOSY
PREFILLED_SYRINGE | INTRAMUSCULAR | Status: AC
  Filled 2023-11-23: qty 10

## 2023-11-23 MED ORDER — SODIUM CHLORIDE 0.9 % IV SOLN
250.0000 mL | INTRAVENOUS | Status: DC | PRN
Start: 2023-11-23 — End: 2023-11-24

## 2023-11-23 SURGICAL SUPPLY — 21 items
BAG SNAP BAND KOVER 36X36 (MISCELLANEOUS) IMPLANT
BLANKET WARM UNDERBOD FULL ACC (MISCELLANEOUS) ×2 IMPLANT
CABLE PFA RX CATH CONN (CABLE) IMPLANT
CATH FARAWAVE ABLATION 31 (CATHETERS) IMPLANT
CATH OCTARAY 2.0 F 3-3-3-3-3 (CATHETERS) IMPLANT
CATH SOUNDSTAR ECO 8FR (CATHETERS) IMPLANT
CATH WEBSTER BI DIR CS D-F CRV (CATHETERS) IMPLANT
CLOSURE PERCLOSE PROSTYLE (VASCULAR PRODUCTS) IMPLANT
COVER SWIFTLINK CONNECTOR (BAG) ×2 IMPLANT
DEVICE CLOSURE MYNXGRIP 6/7F (Vascular Products) IMPLANT
DILATOR VESSEL 38 20CM 16FR (INTRODUCER) IMPLANT
GUIDEWIRE INQWIRE 1.5J.035X260 (WIRE) IMPLANT
INQWIRE 1.5J .035X260CM (WIRE) ×1
KIT VERSACROSS CNCT FARADRIVE (KITS) IMPLANT
PACK EP LF (CUSTOM PROCEDURE TRAY) ×2 IMPLANT
PAD DEFIB RADIO PHYSIO CONN (PAD) ×2 IMPLANT
PATCH CARTO3 (PAD) IMPLANT
SHEATH FARADRIVE STEERABLE (SHEATH) IMPLANT
SHEATH PINNACLE 8F 10CM (SHEATH) IMPLANT
SHEATH PINNACLE 9F 10CM (SHEATH) IMPLANT
SHEATH PROBE COVER 6X72 (BAG) IMPLANT

## 2023-11-23 NOTE — Anesthesia Procedure Notes (Signed)
 Procedure Name: Intubation Date/Time: 11/23/2023 1:46 PM  Performed by: Delores Dus, CRNAPre-anesthesia Checklist: Patient identified, Emergency Drugs available, Suction available and Patient being monitored Patient Re-evaluated:Patient Re-evaluated prior to induction Oxygen Delivery Method: Circle system utilized Preoxygenation: Pre-oxygenation with 100% oxygen Induction Type: IV induction Ventilation: Mask ventilation without difficulty Laryngoscope Size: Miller and 2 Grade View: Grade II Tube type: Oral Tube size: 7.5 mm Number of attempts: 1 Airway Equipment and Method: Stylet and Oral airway Placement Confirmation: ETT inserted through vocal cords under direct vision, positive ETCO2 and breath sounds checked- equal and bilateral Secured at: 22 cm Tube secured with: Tape Dental Injury: Teeth and Oropharynx as per pre-operative assessment

## 2023-11-23 NOTE — Anesthesia Postprocedure Evaluation (Signed)
 Anesthesia Post Note  Patient: [name]  Procedure(s) Performed: ATRIAL FIBRILLATION ABLATION     Patient location during evaluation: PACU Anesthesia Type: General Level of consciousness: awake and alert Pain management: pain level controlled Vital Signs Assessment: post-procedure vital signs reviewed and stable Respiratory status: spontaneous breathing, nonlabored ventilation, respiratory function stable and patient connected to nasal cannula oxygen Cardiovascular status: blood pressure returned to baseline and stable Postop Assessment: no apparent nausea or vomiting Anesthetic complications: no   No notable events documented.  Last Vitals:  Vitals:   11/23/23 1630 11/23/23 1700  BP: 125/82 111/87  Pulse: 71 71  Resp: 16 20  Temp:    SpO2: 99% 97%    Last Pain:  Vitals:   11/23/23 1700  TempSrc:   PainSc: 0-No pain                 [name]

## 2023-11-23 NOTE — Anesthesia Preprocedure Evaluation (Signed)
 Anesthesia Evaluation  Patient identified by MRN, date of birth, ID band Patient awake    Reviewed: Allergy & Precautions, NPO status , Patient's Chart, lab work & pertinent test results, reviewed documented beta blocker date and time   Airway Mallampati: III  TM Distance: <3 FB Neck ROM: Full    Dental  (+) Teeth Intact, Dental Advisory Given, Caps,    Pulmonary former smoker   Pulmonary exam normal breath sounds clear to auscultation       Cardiovascular hypertension, Pt. on home beta blockers + CAD  Normal cardiovascular exam+ dysrhythmias Atrial Fibrillation + pacemaker  Rhythm:Regular Rate:Normal     Neuro/Psych negative neurological ROS     GI/Hepatic Neg liver ROS,GERD  Medicated,,  Endo/Other  negative endocrine ROS    Renal/GU negative Renal ROS     Musculoskeletal  (+) Arthritis ,    Abdominal   Peds  Hematology  (+) Blood dyscrasia (Eliquis )   Anesthesia Other Findings Day of surgery medications reviewed with the patient.  Reproductive/Obstetrics                              Anesthesia Physical Anesthesia Plan  ASA: 3  Anesthesia Plan: General   Post-op Pain Management: Tylenol  PO (pre-op )*   Induction: Intravenous  PONV Risk Score and Plan: 2 and Dexamethasone  and Ondansetron   Airway Management Planned: Oral ETT and Video Laryngoscope Planned  Additional Equipment:   Intra-op Plan:   Post-operative Plan: Extubation in OR  Informed Consent: I have reviewed the patients History and Physical, chart, labs and discussed the procedure including the risks, benefits and alternatives for the proposed anesthesia with the patient or authorized representative who has indicated his/her understanding and acceptance.     Dental advisory given  Plan Discussed with: CRNA  Anesthesia Plan Comments:          Anesthesia Quick Evaluation

## 2023-11-23 NOTE — Transfer of Care (Signed)
 Immediate Anesthesia Transfer of Care Note  Patient: [name]  Procedure(s) Performed: ATRIAL FIBRILLATION ABLATION  Patient Location: PACU and Cath Lab  Anesthesia Type:General  Level of Consciousness: awake, alert , and oriented  Airway & Oxygen Therapy: Patient Spontanous Breathing  Post-op Assessment: Report given to RN and Post -op Vital signs reviewed and stable  Post vital signs: Reviewed and stable  Last Vitals:  Vitals Value Taken Time  BP    Temp    Pulse    Resp    SpO2      Last Pain:  Vitals:   11/23/23 1011  TempSrc:   PainSc: 0-No pain         Complications: No notable events documented.

## 2023-11-23 NOTE — Discharge Instructions (Addendum)
 Cardiac Ablation, Care After  This sheet gives you information about how to care for yourself after your procedure. Your health care provider may also give you more specific instructions. If you have problems or questions, contact your health care provider. What can I expect after the procedure? After the procedure, it is common to have: Bruising around your puncture site. Tenderness around your puncture site. Skipped heartbeats. If you had an atrial fibrillation ablation, you may have atrial fibrillation during the first several months after your procedure.  Tiredness (fatigue).  Follow these instructions at home: Puncture site care  Follow instructions from your health care provider about how to take care of your puncture site. Make sure you: REMOVE bandages tomorrow (11/24/2023) at 4:00 pm  Check your puncture site every day for signs of infection. Check for: Redness, swelling, or pain. Fluid or blood. If your puncture site starts to bleed, lie down on your back, apply firm pressure to the area, and contact your health care provider. Warmth. Pus or a bad smell. A pea or marble sized lump/knot at the site is normal and can take up to three months to resolve.  Driving Do not drive for at least 4 days after your procedure or however long your health care provider recommends. (Do not resume driving if you have previously been instructed not to drive for other health reasons.) Do not drive or use heavy machinery while taking prescription pain medicine. Activity Avoid activities that take a lot of effort for at least 7 days after your procedure. Do not lift anything that is heavier than 5 lb (4.5 kg) for one week.  No sexual activity for 1 week.  Return to your normal activities as told by your health care provider. Ask your health care provider what activities are safe for you. General instructions Take over-the-counter and prescription medicines only as told by your health care  provider. Do not use any products that contain nicotine or tobacco, such as cigarettes and e-cigarettes. If you need help quitting, ask your health care provider. You may shower after removal of dressings, but Do not take baths, swim, or use a hot tub for 1 week.  Do not drink alcohol for 24 hours after your procedure. Keep all follow-up visits as told by your health care provider. This is important. Contact a health care provider if: You have redness, mild swelling, or pain around your puncture site. You have fluid or blood coming from your puncture site that stops after applying firm pressure to the area for 15 minutes. Your puncture site feels warm to the touch. You have pus or a bad smell coming from your puncture site. You have a fever. You have chest pain or discomfort that spreads to your neck, jaw, or arm. You have chest pain that is worse with lying on your back or taking a deep breath. You are sweating a lot. You feel nauseous. You have a fast or irregular heartbeat. You have shortness of breath. You are dizzy or light-headed and feel the need to lie down. You have pain or numbness in the arm or leg closest to your puncture site. Get help right away if: Your puncture site suddenly swells. Lie flat, apply pressure to swollen area and CALL 911 Your puncture site is bleeding. Lie flat, apply pressure for 15 minutes. If the bleeding does not stop after applying firm pressure to the area for 15 minutes.CALL 911 These symptoms may represent a serious problem that is an emergency. Do not  wait to see if the symptoms will go away. Get medical help right away. Call your local emergency services (911 in the [place] Do not drive yourself to the hospital. Summary After the procedure, it is normal to have bruising and tenderness at the puncture site in your groin, neck, or forearm. Check your puncture site every day for signs of infection. Get help right away if your puncture site is bleeding  and the bleeding does not stop after applying firm pressure to the area. This is a medical emergency. This information is not intended to replace advice given to you by your health care provider. Make sure you discuss any questions you have with your health care provider.

## 2023-11-23 NOTE — H&P (Signed)
 Electrophysiology Office Note:    Date:  11/23/2023   ID:  [name], DOB [birth year], MRN [id number]  PCP:  Sadie Manna, MD   [facility] Providers Cardiologist:  Gordy Bergamo, MD Electrophysiologist:  Eulas FORBES Furbish, MD     Referring MD: No ref. provider found   History of Present Illness:    [name] is a 78 y.o. male with a medical history significant for complete heart block, St. Jude pacemaker, persistent atrial fibrillation, coronary calcification on CT chest referred for device and arrhythmia management.     He has a Printmaker in 2015 for complete heart block.  He has a history of paroxysmal atrial fibrillation as well as been on Eliquis  since 2015.  He underwent DC cardioversion in December 2023.  Device trends indicate that he maintained sinus rhythm until August or September of this past year.  he has no device related complaints -- no new tenderness, drainage, redness.   He notes that he has had increased fatigue and shortness of breath since late August or early September of this year.  EKG from October 2024 shows atrial fibrillation, and his device indicates that he has been in persistent atrial fibrillation correlating with onset of his symptoms.     Today, he is at baseline.  I reviewed the patient's CT and labs. There was no LAA thrombus. he  has not missed any doses of anticoagulation, and he took his dose last night. There have been no changes in the patient's diagnoses, medications, or condition since our recent clinic visit.   EKGs/Labs/Other Studies Reviewed Today:     Echocardiogram:  TTE March 2015 EF 55 to 60%.  Normal left atrial size.  TTE - Ordered Results pending  EKG:         Physical Exam:    VS:  BP (!) 119/90   Pulse 70   Temp 97.8 F (36.6 C) (Oral)   Resp 18   Ht 6' 1 (1.854 m)   Wt 93 kg   SpO2 97%   BMI 27.05 kg/m     Wt Readings from Last 3 Encounters:  11/23/23 93 kg   10/23/23 93 kg  09/26/23 95.3 kg     GEN: Well nourished, well developed in no acute distress CARDIAC: RRR, no murmurs, rubs, gallops The device site is normal -- no tenderness, edema, drainage, redness, threatened erosion.  RESPIRATORY:  Normal work of breathing MUSCULOSKELETAL: no edema    ASSESSMENT & PLAN:     Complete heart block  He is device-dependent today Schedule TTE to assess EF with high RV pacing burden  St. Jude dual-chamber pacemaker Dual chamber pacemaker placed in 2015 At [facility] -we will schedule generator change.  I explained the risks of the pacemaker generator change including infection and bleeding. I reviewed today's interrogation.  See Paceart for details   Persistent atrial fibrillation Suspect this may be the cause of his fatigue and shortness of breath We will schedule cardioversion to occur 3 weeks after resumption of anticoagulation following generator change We discussed options for long-term management and made the joint decision to proceed with atrial fibrillation ablation  We discussed the indication, rationale, logistics, anticipated benefits, and potential risks of the ablation procedure including but not limited to -- bleed at the groin access site, chest pain, damage to nearby organs such as the diaphragm, lungs, or esophagus, need for a drainage tube, or prolonged hospitalization. I explained that the risk for stroke, heart  attack, need for open chest surgery, or even death is very low but not zero. he  expressed understanding and wishes to proceed.   Secondary hypercoagulable state CHA2DS2-VASc score is 4 Continue apixaban  5 mg twice daily  History of coronary calcium on CT Would prefer to avoid flecainide.    Signed, Eulas FORBES Furbish, MD  11/23/2023 12:31 PM    [facility]

## 2023-11-24 ENCOUNTER — Encounter (HOSPITAL_COMMUNITY): Payer: Self-pay | Admitting: Cardiovascular Disease

## 2023-11-24 MED FILL — Cefazolin Sodium-Dextrose IV Solution 2 GM/100ML-4%: INTRAVENOUS | Qty: 100 | Status: AC

## 2023-11-24 MED FILL — Fentanyl Citrate Preservative Free (PF) Inj 100 MCG/2ML: INTRAMUSCULAR | Qty: 1 | Status: AC

## 2023-11-24 MED FILL — Atropine Sulfate Soln Prefill Syr 1 MG/10ML (0.1 MG/ML): INTRAMUSCULAR | Qty: 10 | Status: AC

## 2023-11-29 ENCOUNTER — Encounter: Payer: Self-pay | Admitting: Dermatology

## 2023-11-29 ENCOUNTER — Ambulatory Visit (INDEPENDENT_AMBULATORY_CARE_PROVIDER_SITE_OTHER): Payer: No Typology Code available for payment source | Admitting: Dermatology

## 2023-11-29 DIAGNOSIS — L720 Epidermal cyst: Secondary | ICD-10-CM | POA: Diagnosis not present

## 2023-11-29 DIAGNOSIS — L821 Other seborrheic keratosis: Secondary | ICD-10-CM

## 2023-11-29 NOTE — Patient Instructions (Signed)

## 2023-11-29 NOTE — Progress Notes (Signed)
   New Patient Visit   Subjective  [name] is a 78 y.o. male who presents for the following: spot at back that wife noticed last week, has central black dot. No hx skin cancer.  The patient has spots, moles and lesions to be evaluated, some may be new or changing and the patient may have concern these could be cancer.   The following portions of the chart were reviewed this encounter and updated as appropriate: medications, allergies, medical history  Review of Systems:  No other skin or systemic complaints except as noted in HPI or Assessment and Plan.  Objective  Well appearing patient in no apparent distress; mood and affect are within normal limits.   A focused examination was performed of the following areas: Back, chest  Relevant exam findings are noted in the Assessment and Plan.    Assessment & Plan   EPIDERMAL INCLUSION CYST Exam: Subcutaneous nodule at mid back  Benign-appearing. Exam most consistent with an epidermal inclusion cyst. Discussed that a cyst is a benign growth that can grow over time and sometimes get irritated or inflamed. Recommend observation if it is not bothersome. Discussed option of surgical excision to remove it if it is growing, symptomatic, or other changes noted. Please call for new or changing lesions so they can be evaluated.  SEBORRHEIC KERATOSIS - Stuck-on, waxy, tan-brown papules and/or plaques  - Benign-appearing - Discussed benign etiology and prognosis. - Observe - Call for any changes     SEBORRHEIC KERATOSES   EIC (EPIDERMAL INCLUSION CYST)    Return if symptoms worsen or fail to improve.  Anise Salvo, RMA, am acting as scribe for Elie Goody, MD .   Documentation: I have reviewed the above documentation for accuracy and completeness, and I agree with the above.  Elie Goody, MD

## 2023-12-03 ENCOUNTER — Encounter: Payer: Self-pay | Admitting: Dermatology

## 2023-12-06 ENCOUNTER — Encounter: Payer: Self-pay | Admitting: General Practice

## 2023-12-21 ENCOUNTER — Ambulatory Visit (HOSPITAL_COMMUNITY): Payer: No Typology Code available for payment source | Admitting: Internal Medicine

## 2023-12-26 ENCOUNTER — Encounter (HOSPITAL_COMMUNITY): Payer: Self-pay

## 2023-12-26 ENCOUNTER — Ambulatory Visit (HOSPITAL_COMMUNITY): Payer: No Typology Code available for payment source | Admitting: Internal Medicine

## 2023-12-26 NOTE — Progress Notes (Incomplete)
Primary Care Physician: Barbette Reichmann, MD Primary Cardiologist: Yates Decamp, MD Electrophysiologist: Maurice Small, MD  {Click to update primary MD,subspecialty MD or APP then REFRESH:1}   Referring Physician: Dr. Nelly Laurence  {Removed [facility], PMH, PSH, ALLERGY, CMED, and SOC :1}   [name] is a 78 y.o. male with a history of CHB s/p PPM, coronary artery calcification on CT, HTN, HLD, and paroxysmal atrial fibrillation who presents for consultation in the [facility]. S/p Afib ablation on 11/23/23 by Dr. Nelly Laurence. Patient is on Eliquis for a CHADS2VASC score of 4.  On evaluation today, patient is currently in ***. No episodes of Afib since ablation. No chest pain or SOB. Leg sites healed without issue. No missed doses of Eliquis.   Today, he denies symptoms of orthopnea, PND, lower extremity edema, dizziness, presyncope, syncope, snoring, daytime somnolence, bleeding, or neurologic sequela. The patient is tolerating medications without difficulties and is otherwise without complaint today.    he has a BMI of There is no height or weight on file to calculate BMI.. There were no vitals filed for this visit.  Current Outpatient Medications  Medication Sig Dispense Refill   apixaban (ELIQUIS) 5 MG TABS tablet TAKE 1 TABLET(5 MG) BY MOUTH TWICE DAILY     Ascorbic Acid (VITAMIN C) 1000 MG tablet Take 1,000 mg by mouth daily.     carvedilol (COREG) 12.5 MG tablet TAKE 1 TABLET(12.5 MG) BY MOUTH TWICE DAILY WITH A MEAL 180 tablet 3   cholecalciferol (VITAMIN D3) 25 MCG (1000 UNIT) tablet Take 1,000 Units by mouth daily.     Multiple Vitamins-Minerals (PRESERVISION AREDS 2 PO) Take 1 tablet by mouth in the morning and at bedtime.     omeprazole (PRILOSEC) 40 MG capsule TAKE ONE CAPSULE BY MOUTH DAILY, TAKE 30 MINTUES BEFORE BREAKFAST 90 capsule 3   Pseudoeph-Doxylamine-DM-APAP (NYQUIL PO) Take 1 Dose by mouth at bedtime as needed (congestion).     Zinc 50 MG CAPS  Take 50 mg by mouth daily.      No current facility-administered medications for this visit.    Atrial Fibrillation Management history:  Previous antiarrhythmic drugs: none Previous cardioversions: 10/14/22 Previous ablations: 11/23/23 Anticoagulation history: Eliquis   ROS- All systems are reviewed and negative except as per the HPI above.  Physical Exam: There were no vitals taken for this visit.  GEN: Well nourished, well developed in no acute distress NECK: No JVD; No carotid bruits CARDIAC: {EPRHYTHM:28826}, no murmurs, rubs, gallops RESPIRATORY:  Clear to auscultation without rales, wheezing or rhonchi  ABDOMEN: Soft, non-tender, non-distended EXTREMITIES:  No edema; No deformity   EKG today demonstrates ***  Echo 09/28/23 demonstrated  1. Left ventricular ejection fraction, by estimation, is 50%. The left  ventricle has mildly decreased function. The left ventricle demonstrates  global hypokinesis with septal-lateral dyssynchrony consistent with RV  pacing. Left ventricular diastolic  parameters are indeterminate.   2. Right ventricular systolic function is mildly reduced (function looks  worse towards RV apex). The right ventricular size is mildly enlarged.  There is normal pulmonary artery systolic pressure. The estimated right  ventricular systolic pressure is 23.8   mmHg.   3. Right atrial size was moderately dilated.   4. The mitral valve is normal in structure. Trivial mitral valve  regurgitation. No evidence of mitral stenosis.   5. The aortic valve is tricuspid. There is mild calcification of the  aortic valve. Aortic valve regurgitation is not visualized. No aortic  stenosis is present.   6. Aortic dilatation noted. There is mild dilatation of the ascending  aorta, measuring 38 mm.   7. The inferior vena cava is normal in size with greater than 50%  respiratory variability, suggesting right atrial pressure of 3 mmHg.   ASSESSMENT & PLAN CHA2DS2-VASc  Score = 3  The patient's score is based upon: CHF History: 0 HTN History: 1 Diabetes History: 0 Stroke History: 0 Vascular Disease History: 0 Age Score: 2 Gender Score: 0   {Confirm score is correct.  If not, click here to update score.  REFRESH note.  :1}   Score 4 with CAD  ASSESSMENT AND PLAN: {Select the correct AFib Diagnosis                 :[id number]}  S/p Afib ablation on 11/23/23 by Dr. Nelly Laurence.  He is currently in ***. Continue carvedilol 12.5 mg BID.   Follow up as scheduled with EP.    Lake Bells, PA-C  Afib Clinic [facility] [address] [place], [place] [address] [phone]

## 2024-01-09 ENCOUNTER — Encounter (HOSPITAL_COMMUNITY): Payer: Self-pay | Admitting: Physician Assistant

## 2024-01-09 ENCOUNTER — Ambulatory Visit (HOSPITAL_COMMUNITY)
Admission: RE | Admit: 2024-01-09 | Discharge: 2024-01-09 | Disposition: A | Discharge status: Home / Self Care | Payer: No Typology Code available for payment source | Source: Ambulatory Visit | Attending: Physician Assistant | Admitting: Physician Assistant

## 2024-01-09 VITALS — BP 114/84 | HR 70 | Ht 73.0 in | Wt 212.8 lb

## 2024-01-09 DIAGNOSIS — Z7901 Long term (current) use of anticoagulants: Secondary | ICD-10-CM | POA: Insufficient documentation

## 2024-01-09 DIAGNOSIS — I10 Essential (primary) hypertension: Secondary | ICD-10-CM | POA: Insufficient documentation

## 2024-01-09 DIAGNOSIS — Z79899 Other long term (current) drug therapy: Secondary | ICD-10-CM | POA: Insufficient documentation

## 2024-01-09 DIAGNOSIS — Z950 Presence of cardiac pacemaker: Secondary | ICD-10-CM | POA: Insufficient documentation

## 2024-01-09 DIAGNOSIS — D6869 Other thrombophilia: Secondary | ICD-10-CM | POA: Diagnosis not present

## 2024-01-09 DIAGNOSIS — E785 Hyperlipidemia, unspecified: Secondary | ICD-10-CM | POA: Insufficient documentation

## 2024-01-09 DIAGNOSIS — I4819 Other persistent atrial fibrillation: Secondary | ICD-10-CM | POA: Diagnosis not present

## 2024-01-09 DIAGNOSIS — I2510 Atherosclerotic heart disease of native coronary artery without angina pectoris: Secondary | ICD-10-CM | POA: Insufficient documentation

## 2024-01-09 NOTE — Progress Notes (Signed)
 Primary Care Physician: Barbette Reichmann, MD Primary Cardiologist: Yates Decamp, MD Electrophysiologist: Maurice Small, MD  Referring Physician: Dr. Vallery Sa [name] is a 78 y.o. male with a history of CHB s/p PPM, coronary artery calcification on CT, HTN, HLD, and atrial fibrillation who presents for follow up in the [facility] Fibr[facility]. S/p Afib ablation on 11/23/23 by Dr. Nelly Laurence. Patient is on Eliquis for a CHADS2VASC score of 4.  Patient returns for follow up for atrial fibrillation. He remains in SR. No symptoms of Afib since the ablation. He has noticed that he can walk further without becoming winded. No chest pain or groin issues. No bleeding issues on anticoagulation.   Today, he denies symptoms of palpitations, chest pain, shortness of breath, orthopnea, PND, lower extremity edema, dizziness, presyncope, syncope, snoring, daytime somnolence, bleeding, or neurologic sequela. The patient is tolerating medications without difficulties and is otherwise without complaint today.    Current Outpatient Medications  Medication Sig Dispense Refill   apixaban (ELIQUIS) 5 MG TABS tablet TAKE 1 TABLET(5 MG) BY MOUTH TWICE DAILY     Ascorbic Acid (VITAMIN C) 1000 MG tablet Take 1,000 mg by mouth daily.     carvedilol (COREG) 12.5 MG tablet TAKE 1 TABLET(12.5 MG) BY MOUTH TWICE DAILY WITH A MEAL 180 tablet 3   cholecalciferol (VITAMIN D3) 25 MCG (1000 UNIT) tablet Take 1,000 Units by mouth daily.     Multiple Vitamins-Minerals (PRESERVISION AREDS 2 PO) Take 1 tablet by mouth in the morning and at bedtime.     omeprazole (PRILOSEC) 40 MG capsule TAKE ONE CAPSULE BY MOUTH DAILY, TAKE 30 MINTUES BEFORE BREAKFAST 90 capsule 3   Pseudoeph-Doxylamine-DM-APAP (NYQUIL PO) Take 1 Dose by mouth at bedtime as needed (congestion).     Zinc 50 MG CAPS Take 50 mg by mouth daily.      No current facility-administered medications for this encounter.    Atrial Fibrillation  Management history:  Previous antiarrhythmic drugs: none Previous cardioversions: 10/14/22 Previous ablations: 11/23/23 Anticoagulation history: Eliquis   ROS- All systems are reviewed and negative except as per the HPI above.  Physical Exam: BP 114/84   Pulse 70   Ht 6\' 1"  (1.854 m)   Wt 96.5 kg   BMI 28.08 kg/m   GEN: Well nourished, well developed in no acute distress CARDIAC: Regular rate and rhythm, no murmurs, rubs, gallops RESPIRATORY:  Clear to auscultation without rales, wheezing or rhonchi  ABDOMEN: Soft, non-tender, non-distended EXTREMITIES:  No edema; No deformity    EKG today demonstrates  AV dual paced rhythm Vent. rate 70 BPM PR interval 176 ms QRS duration 194 ms QT/QTcB 474/511 ms   Echo 09/28/23 demonstrated  1. Left ventricular ejection fraction, by estimation, is 50%. The left  ventricle has mildly decreased function. The left ventricle demonstrates  global hypokinesis with septal-lateral dyssynchrony consistent with RV  pacing. Left ventricular diastolic parameters are indeterminate.   2. Right ventricular systolic function is mildly reduced (function looks  worse towards RV apex). The right ventricular size is mildly enlarged.  There is normal pulmonary artery systolic pressure. The estimated right  ventricular systolic pressure is 23.8 mmHg.   3. Right atrial size was moderately dilated.   4. The mitral valve is normal in structure. Trivial mitral valve  regurgitation. No evidence of mitral stenosis.   5. The aortic valve is tricuspid. There is mild calcification of the  aortic valve. Aortic valve regurgitation is not visualized. No  aortic  stenosis is present.   6. Aortic dilatation noted. There is mild dilatation of the ascending  aorta, measuring 38 mm.   7. The inferior vena cava is normal in size with greater than 50%  respiratory variability, suggesting right atrial pressure of 3 mmHg.    CHA2DS2-VASc Score = 4  The patient's score is  based upon: CHF History: 0 HTN History: 1 Diabetes History: 0 Stroke History: 0 Vascular Disease History: 1 Age Score: 2 Gender Score: 0        ASSESSMENT AND PLAN: Persistent Atrial Fibrillation (ICD10:  I48.19) The patient's CHA2DS2-VASc score is 4, indicating a 4.8% annual risk of stroke.   S/p Afib ablation on 11/23/23 by Dr. Nelly Laurence. Patient appears to be maintaining SR Continue carvedilol 12.5 mg BID Continue Eliquis 5 mg BID with no missed doses for 3 months post ablation.   Secondary Hypercoagulable State (ICD10:  D68.69) The patient is at significant risk for stroke/thromboembolism based upon his CHA2DS2-VASc Score of 4.  Continue Apixaban (Eliquis).   CHB S/p PPM, followed by Dr Nelly Laurence and the device clinic  HTN Stable on current regimen  CAD CAC score 568 No anginal symptoms Followed by Dr Jacinto Halim   Follow up with Francis Dowse as scheduled.    [name] PA-C Afib Clinic [facility] [address] [place], [place] [address] [phone]

## 2024-01-23 ENCOUNTER — Ambulatory Visit: Payer: No Typology Code available for payment source | Attending: Cardiovascular Disease

## 2024-01-23 DIAGNOSIS — I442 Atrioventricular block, complete: Secondary | ICD-10-CM | POA: Diagnosis not present

## 2024-01-23 LAB — CUP PACEART REMOTE DEVICE CHECK
Battery Remaining Longevity: 103 mo
Battery Remaining Percentage: 95.5 %
Battery Voltage: 3.01 V
Brady Statistic AP VP Percent: 97 %
Brady Statistic AP VS Percent: 1 %
Brady Statistic AS VP Percent: 2.8 %
Brady Statistic AS VS Percent: 1 %
Brady Statistic RA Percent Paced: 97 %
Brady Statistic RV Percent Paced: 99 %
Date Time Interrogation Session: 20250311034805
Implantable Lead Connection Status: 753985
Implantable Lead Connection Status: 753985
Implantable Lead Implant Date: 20150325
Implantable Lead Implant Date: 20150325
Implantable Lead Location: 753859
Implantable Lead Location: 753860
Implantable Lead Model: 5076
Implantable Pulse Generator Implant Date: 20241209
Lead Channel Impedance Value: 460 Ohm
Lead Channel Impedance Value: 510 Ohm
Lead Channel Pacing Threshold Amplitude: 0.5 V
Lead Channel Pacing Threshold Amplitude: 0.75 V
Lead Channel Pacing Threshold Pulse Width: 0.4 ms
Lead Channel Pacing Threshold Pulse Width: 0.4 ms
Lead Channel Sensing Intrinsic Amplitude: 3.9 mV
Lead Channel Setting Pacing Amplitude: 2 V
Lead Channel Setting Pacing Amplitude: 2 V
Lead Channel Setting Pacing Pulse Width: 0.4 ms
Lead Channel Setting Sensing Sensitivity: 4 mV
Pulse Gen Model: 2272
Pulse Gen Serial Number: 8237607

## 2024-01-25 ENCOUNTER — Ambulatory Visit: Payer: No Typology Code available for payment source | Admitting: Physician Assistant

## 2024-01-31 ENCOUNTER — Encounter: Payer: Self-pay | Admitting: Cardiovascular Disease

## 2024-02-07 ENCOUNTER — Telehealth: Payer: Self-pay

## 2024-02-07 ENCOUNTER — Other Ambulatory Visit (INDEPENDENT_AMBULATORY_CARE_PROVIDER_SITE_OTHER): Payer: Self-pay

## 2024-02-07 ENCOUNTER — Ambulatory Visit (INDEPENDENT_AMBULATORY_CARE_PROVIDER_SITE_OTHER): Admitting: Orthopaedic Surgery

## 2024-02-07 VITALS — Ht 73.0 in | Wt 212.0 lb

## 2024-02-07 DIAGNOSIS — M1711 Unilateral primary osteoarthritis, right knee: Secondary | ICD-10-CM | POA: Insufficient documentation

## 2024-02-07 DIAGNOSIS — M25561 Pain in right knee: Secondary | ICD-10-CM

## 2024-02-07 DIAGNOSIS — G8929 Other chronic pain: Secondary | ICD-10-CM | POA: Diagnosis not present

## 2024-02-07 NOTE — Telephone Encounter (Signed)
 Right knee gel injection

## 2024-02-07 NOTE — Progress Notes (Signed)
 The patient is a 78 year old that we have seen in the past.  He has known tricompartment arthritis of his right knee.  He has had 2 hyaluronic acid injections in the past with the last one being over 2 years ago.  He says he is still not ready for knee replacement surgery and would still like to consider another gel injection in his right knee since that hyaluronic acid is helped in the most compared to steroids.  He said no acute change in medical status.  He is on blood thinning medication and has a pacemaker.  He is not a diabetic.  Examination of his right knee shows no effusion today.  There is medial joint line tenderness and patellofemoral crepitation throughout the arc of motion of his knee.  His knee has some varus malalignment that is correctable.  X-rays of his right knee show tricompartment arthritis mainly involving the medial compartment of the knee.  This is not changed when compared to previous films from as far back as 2019.  Given the fact that hyaluronic acid is helping the most in the past to treat the pain from osteoarthritis this is reasonable order again.  We will see him back once we have this injection here and available.  This patient is diagnosed with osteoarthritis of the knee(s).    Radiographs show evidence of joint space narrowing, osteophytes, subchondral sclerosis and/or subchondral cysts.  This patient has knee pain which interferes with functional and activities of daily living.    This patient has experienced inadequate response, adverse effects and/or intolerance with conservative treatments such as acetaminophen, NSAIDS, topical creams, physical therapy or regular exercise, knee bracing and/or weight loss.   This patient has experienced inadequate response or has a contraindication to intra articular steroid injections for at least 3 months.   This patient is not scheduled to have a total knee replacement within 6 months of starting treatment with  viscosupplementation.

## 2024-02-21 NOTE — Progress Notes (Unsigned)
 Cardiology Office Note:  .   Date:  02/21/2024  ID:  [name], DOB [birth year], MRN [id number] PCP: Barbette Reichmann, MD  [facility] Providers Cardiologist:  Yates Decamp, MD Electrophysiologist:  Maurice Small, MD {  History of Present Illness: .   [name] is a 78 y.o. male w/PMHx of  HTN, HLD Coronary Ca++ Single kidney (due hydronephrosis of left kidney) CHB w/PPM AFib  Saw Dr. Nelly Laurence 09/26/23, persistent Afib noted with progression of fatigue, SOB Device dependent ERI Planned for gen change > afib ablation for rhythm strategy, afterwards once back on uninterrupted a/c Discussed preference to avoid flecainide given his coronary ca++  Gen change on 10/23/23 AFib ablation 11/23/23   Today's visit is scheduled as a 91 day/post ablation visit ROS:   Feels well Was symptomatic with his AFib and does not think he has had any Pacer site and ablation sites healed well No CP, palpitations or cardiac awareness No SOB No near syncope or syncope No bleeding or signs of bleeding   Device information SJM dual chamber PPM implanted 02/05/2014, gen change 10/23/23 Has a MDT lead in the RA  Arrhythmia/AAD hx AFib AFib ablation 11/23/23 No AAD to date  Studies Reviewed: Marland Kitchen    EKG done today and reviewed by myself:  AV paced 70bpm  DEVICE interrogation done today and reviewed by myself Battery and lead measurements are good Rare and very brief PATs 14 seconds Flutter > only a few days post ablation  11/23/23: EPS/ablation CONCLUSIONS: 1. AF upon presentation.   2. Successful ablation of all four pulmonary veins with pulsed field energy. 3. Successful ablation of the posterior wall of the left atrium with pulsed field energy 4. No inducible arrhythmias following ablation  5. No early apparent complications.  11/10/23: Cardiac CT IMPRESSION: 1. There is normal pulmonary vein drainage into the left atrium. Measurements as reported   2. There is no  thrombus in the left atrial appendage.   3. The esophagus courses posterior to ostium of left lower pulmonary vein   4. Coronary calcium score 568 (65th percentile)   09/28/23: TTE 1. Left ventricular ejection fraction, by estimation, is 50%. The left  ventricle has mildly decreased function. The left ventricle demonstrates  global hypokinesis with septal-lateral dyssynchrony consistent with RV  pacing. Left ventricular diastolic  parameters are indeterminate.   2. Right ventricular systolic function is mildly reduced (function looks  worse towards RV apex). The right ventricular size is mildly enlarged.  There is normal pulmonary artery systolic pressure. The estimated right  ventricular systolic pressure is 23.8   mmHg.   3. Right atrial size was moderately dilated.   4. The mitral valve is normal in structure. Trivial mitral valve  regurgitation. No evidence of mitral stenosis.   5. The aortic valve is tricuspid. There is mild calcification of the  aortic valve. Aortic valve regurgitation is not visualized. No aortic  stenosis is present.   6. Aortic dilatation noted. There is mild dilatation of the ascending  aorta, measuring 38 mm.   7. The inferior vena cava is normal in size with greater than 50%  respiratory variability, suggesting right atrial pressure of 3 mmHg.    Risk Assessment/Calculations:    Physical Exam:   VS:  There were no vitals taken for this visit.   Wt Readings from Last 3 Encounters:  02/07/24 212 lb (96.2 kg)  01/09/24 212 lb 12.8 oz (96.5 kg)  11/23/23 205 lb (93 kg)  GEN: Well nourished, well developed in no acute distress NECK: No JVD; No carotid bruits CARDIAC: RRR, no murmurs, rubs, gallops RESPIRATORY:  CTA b/l without rales, wheezing or rhonchi  ABDOMEN: Soft, non-tender, non-distended EXTREMITIES: No edema; No deformity   PPM site: is stable, no thinning, fluctuation, tethering  ASSESSMENT AND PLAN: .    PPM intact function no  programming changes made  persistent AFib CHA2DS2Vasc is 3, on eliquis, appropriately dosed <1 % burden  HTN Unusual for him today Home numbers 120's No symptoms  Coronary Ca++ C/w Dr. Royetta Car  Secondary hypercoagulable state 2/2 AFib     Dispo: remotes as usual, back in clinic in 65mo, sooner if needed  Signed, Sheilah Pigeon, PA-C

## 2024-02-22 ENCOUNTER — Ambulatory Visit: Payer: No Typology Code available for payment source | Attending: Physician Assistant | Admitting: Physician Assistant

## 2024-02-22 ENCOUNTER — Encounter: Payer: Self-pay | Admitting: Physician Assistant

## 2024-02-22 VITALS — BP 100/64 | HR 69 | Ht 73.0 in | Wt 213.4 lb

## 2024-02-22 DIAGNOSIS — D6869 Other thrombophilia: Secondary | ICD-10-CM

## 2024-02-22 DIAGNOSIS — I10 Essential (primary) hypertension: Secondary | ICD-10-CM | POA: Diagnosis not present

## 2024-02-22 DIAGNOSIS — I4819 Other persistent atrial fibrillation: Secondary | ICD-10-CM

## 2024-02-22 DIAGNOSIS — Z950 Presence of cardiac pacemaker: Secondary | ICD-10-CM | POA: Diagnosis not present

## 2024-02-22 LAB — CUP PACEART INCLINIC DEVICE CHECK
Battery Remaining Longevity: 97 mo
Battery Voltage: 2.99 V
Brady Statistic RA Percent Paced: 97 %
Brady Statistic RV Percent Paced: 99.91 %
Date Time Interrogation Session: 20250410174750
Implantable Lead Connection Status: 753985
Implantable Lead Connection Status: 753985
Implantable Lead Implant Date: 20150325
Implantable Lead Implant Date: 20150325
Implantable Lead Location: 753859
Implantable Lead Location: 753860
Implantable Lead Model: 5076
Implantable Pulse Generator Implant Date: 20241209
Lead Channel Impedance Value: 462.5 Ohm
Lead Channel Impedance Value: 512.5 Ohm
Lead Channel Pacing Threshold Amplitude: 0.5 V
Lead Channel Pacing Threshold Amplitude: 0.5 V
Lead Channel Pacing Threshold Amplitude: 0.75 V
Lead Channel Pacing Threshold Amplitude: 0.75 V
Lead Channel Pacing Threshold Pulse Width: 0.4 ms
Lead Channel Pacing Threshold Pulse Width: 0.4 ms
Lead Channel Pacing Threshold Pulse Width: 0.4 ms
Lead Channel Pacing Threshold Pulse Width: 0.4 ms
Lead Channel Sensing Intrinsic Amplitude: 5 mV
Lead Channel Setting Pacing Amplitude: 2 V
Lead Channel Setting Pacing Amplitude: 2 V
Lead Channel Setting Pacing Pulse Width: 0.4 ms
Lead Channel Setting Sensing Sensitivity: 4 mV
Pulse Gen Model: 2272
Pulse Gen Serial Number: 8237607

## 2024-02-22 NOTE — Patient Instructions (Signed)
 Medication Instructions:   Your physician recommends that you continue on your current medications as directed. Please refer to the Current Medication list given to you today.   *If you need a refill on your cardiac medications before your next appointment, please call your pharmacy*  Lab Work: NONE ORDERED  TODAY   If you have labs (blood work) drawn today and your tests are completely normal, you will receive your results only by: MyChart Message (if you have MyChart) OR A paper copy in the mail If you have any lab test that is abnormal or we need to change your treatment, we will call you to review the results.  Testing/Procedures: NONE ORDERED  TODAY    Follow-Up: At [facility], you and your health needs are our priority.  As part of our continuing mission to provide you with exceptional heart care, our providers are all part of one team.  This team includes your primary Cardiologist (physician) and Advanced Practice Providers or APPs (Physician Assistants and Nurse Practitioners) who all work together to provide you with the care you need, when you need it.  Your next appointment:   6 month(s)  Provider:   York Pellant, MD or Francis Dowse, PA-C   We recommend signing up for the patient portal called "MyChart".  Sign up information is provided on this After Visit Summary.  MyChart is used to connect with patients for Virtual Visits (Telemedicine).  Patients are able to view lab/test results, encounter notes, upcoming appointments, etc.  Non-urgent messages can be sent to your provider as well.   To learn more about what you can do with MyChart, go to [email or web address].   Other Instructions        1st Floor: - Lobby - Registration  - Pharmacy  - Lab - Cafe  2nd Floor: - PV Lab - Diagnostic Testing (echo, CT, nuclear med)  3rd Floor: - Vacant  4th Floor: - TCTS (cardiothoracic surgery) - AFib Clinic - Structural Heart Clinic - Vascular  Surgery  - Vascular Ultrasound  5th Floor: - HeartCare Cardiology (general and EP) - Clinical Pharmacy for coumadin, hypertension, lipid, weight-loss medications, and med management appointments    Valet parking services will be available as well.

## 2024-02-23 NOTE — Telephone Encounter (Addendum)
 VOB has been submitted for Monovisc, right knee PA has been faxed to [facility] at [phone]  Reg. G. With [facility] called stating that Durolane, Synvisc, and Synvisc One are the preferred products.    VOB submitted for Durolane, right knee.

## 2024-02-27 ENCOUNTER — Encounter: Payer: Self-pay | Admitting: Cardiovascular Disease

## 2024-02-28 ENCOUNTER — Other Ambulatory Visit: Payer: Self-pay

## 2024-02-28 ENCOUNTER — Telehealth: Payer: Self-pay | Admitting: Orthopaedic Surgery

## 2024-02-28 DIAGNOSIS — M1711 Unilateral primary osteoarthritis, right knee: Secondary | ICD-10-CM

## 2024-02-28 NOTE — Telephone Encounter (Signed)
 Talked with patient and appt.has been scheduled for gel injection with Dr. Lucienne Ryder

## 2024-02-28 NOTE — Telephone Encounter (Signed)
 Patient called and said that he got some paperwork that say he got approved for the GEL injection so he wanted to confirm. CB#[phone]

## 2024-03-04 ENCOUNTER — Telehealth: Payer: Self-pay | Admitting: Orthopaedic Surgery

## 2024-03-04 NOTE — Telephone Encounter (Signed)
 Patient called says the insurance company sent a letter saying he isn't approved for the gel injections. Would like a call before his appointment date.

## 2024-03-05 NOTE — Telephone Encounter (Signed)
 Talked with patient concerning approval for the preferred gel injection with his insurance.

## 2024-03-07 ENCOUNTER — Ambulatory Visit (INDEPENDENT_AMBULATORY_CARE_PROVIDER_SITE_OTHER): Admitting: Orthopaedic Surgery

## 2024-03-07 ENCOUNTER — Encounter: Payer: Self-pay | Admitting: Orthopaedic Surgery

## 2024-03-07 DIAGNOSIS — M1711 Unilateral primary osteoarthritis, right knee: Secondary | ICD-10-CM | POA: Diagnosis not present

## 2024-03-07 DIAGNOSIS — G8929 Other chronic pain: Secondary | ICD-10-CM

## 2024-03-07 DIAGNOSIS — M25561 Pain in right knee: Secondary | ICD-10-CM

## 2024-03-07 MED ORDER — SODIUM HYALURONATE 60 MG/3ML IX PRSY
60.0000 mg | PREFILLED_SYRINGE | INTRA_ARTICULAR | Status: AC | PRN
  Administered 2024-03-07: 60 mg via INTRA_ARTICULAR

## 2024-03-07 NOTE — Progress Notes (Signed)
   Procedure Note  Patient: [name]             Date of Birth: Mar 21, 1946           MRN: [id number]             Visit Date: 03/07/2024  Procedures: Visit Diagnoses:  1. Unilateral primary osteoarthritis, right knee   2. Chronic pain of right knee     Large Joint Inj: R knee on 03/07/2024 2:52 PM Indications: diagnostic evaluation and pain Details: 22 G 1.5 in needle, superolateral approach  Arthrogram: No  Medications: 60 mg Sodium Hyaluronate 60 MG/3ML Outcome: tolerated well, no immediate complications Procedure, treatment alternatives, risks and benefits explained, specific risks discussed. Consent was given by the patient. Immediately prior to procedure a time out was called to verify the correct patient, procedure, equipment, support staff and site/side marked as required. Patient was prepped and draped in the usual sterile fashion.    [name] comes in today for a hyaluronic acid injection in his right knee to treat the pain from osteoarthritis.  He is an avid golfer and has a golf trip within the next week.  He has had hyaluronic acid injections in the past to treat the pain from osteoarthritis of his right knee and he is fully aware of the risk and benefits of these types of injections.  His right knee shows no effusion today with slight varus malalignment.  I did place Durolane in his right knee today without difficulty.  He knows to wait at least 6 months between these injections.  We can always try steroid again later or even consider knee replacement surgery once it is not helping him.  All question concerns addressed and answered.  Lot #[id number]

## 2024-03-11 NOTE — Progress Notes (Signed)
 Remote pacemaker transmission.

## 2024-04-18 DIAGNOSIS — H43813 Vitreous degeneration, bilateral: Secondary | ICD-10-CM | POA: Diagnosis not present

## 2024-04-18 DIAGNOSIS — H353131 Nonexudative age-related macular degeneration, bilateral, early dry stage: Secondary | ICD-10-CM | POA: Diagnosis not present

## 2024-04-18 DIAGNOSIS — Z961 Presence of intraocular lens: Secondary | ICD-10-CM | POA: Diagnosis not present

## 2024-04-23 ENCOUNTER — Ambulatory Visit: Payer: No Typology Code available for payment source | Attending: Cardiology

## 2024-04-23 DIAGNOSIS — I442 Atrioventricular block, complete: Secondary | ICD-10-CM

## 2024-04-24 LAB — CUP PACEART REMOTE DEVICE CHECK
Battery Remaining Longevity: 98 mo
Battery Remaining Percentage: 95.5 %
Battery Voltage: 2.99 V
Brady Statistic AP VP Percent: 98 %
Brady Statistic AP VS Percent: 1 %
Brady Statistic AS VP Percent: 2.3 %
Brady Statistic AS VS Percent: 1 %
Brady Statistic RA Percent Paced: 98 %
Brady Statistic RV Percent Paced: 99 %
Date Time Interrogation Session: 20250610022758
Implantable Lead Connection Status: 753985
Implantable Lead Connection Status: 753985
Implantable Lead Implant Date: 20150325
Implantable Lead Implant Date: 20150325
Implantable Lead Location: 753859
Implantable Lead Location: 753860
Implantable Lead Model: 5076
Implantable Pulse Generator Implant Date: 20241209
Lead Channel Impedance Value: 450 Ohm
Lead Channel Impedance Value: 490 Ohm
Lead Channel Pacing Threshold Amplitude: 0.5 V
Lead Channel Pacing Threshold Amplitude: 0.75 V
Lead Channel Pacing Threshold Pulse Width: 0.4 ms
Lead Channel Pacing Threshold Pulse Width: 0.4 ms
Lead Channel Sensing Intrinsic Amplitude: 5 mV
Lead Channel Setting Pacing Amplitude: 2 V
Lead Channel Setting Pacing Amplitude: 2 V
Lead Channel Setting Pacing Pulse Width: 0.4 ms
Lead Channel Setting Sensing Sensitivity: 4 mV
Pulse Gen Model: 2272
Pulse Gen Serial Number: 8237607

## 2024-04-26 ENCOUNTER — Ambulatory Visit: Payer: Self-pay | Admitting: Cardiovascular Disease

## 2024-06-25 NOTE — Addendum Note (Signed)
 Addended by: VICCI SELLER A on: 06/25/2024 11:51 AM   Modules accepted: Orders

## 2024-06-25 NOTE — Progress Notes (Signed)
 Remote pacemaker transmission.

## 2024-07-21 ENCOUNTER — Other Ambulatory Visit: Payer: Self-pay | Admitting: Cardiology

## 2024-07-21 DIAGNOSIS — I480 Paroxysmal atrial fibrillation: Secondary | ICD-10-CM

## 2024-07-22 NOTE — Telephone Encounter (Signed)
 Prescription refill request for Eliquis  received. Indication:afib Last office visit:4/25 Scr:0.81  1/25 Age: 77 Weight:96.8  kg  Prescription refilled

## 2024-07-23 ENCOUNTER — Ambulatory Visit: Payer: No Typology Code available for payment source

## 2024-07-23 DIAGNOSIS — I442 Atrioventricular block, complete: Secondary | ICD-10-CM | POA: Diagnosis not present

## 2024-07-24 LAB — CUP PACEART REMOTE DEVICE CHECK
Battery Remaining Longevity: 94 mo
Battery Remaining Percentage: 94 %
Battery Voltage: 2.99 V
Brady Statistic AP VP Percent: 97 %
Brady Statistic AP VS Percent: 1 %
Brady Statistic AS VP Percent: 3.1 %
Brady Statistic AS VS Percent: 1 %
Brady Statistic RA Percent Paced: 97 %
Brady Statistic RV Percent Paced: 99 %
Date Time Interrogation Session: 20250909020016
Implantable Lead Connection Status: 753985
Implantable Lead Connection Status: 753985
Implantable Lead Implant Date: 20150325
Implantable Lead Implant Date: 20150325
Implantable Lead Location: 753859
Implantable Lead Location: 753860
Implantable Lead Model: 5076
Implantable Pulse Generator Implant Date: 20241209
Lead Channel Impedance Value: 410 Ohm
Lead Channel Impedance Value: 480 Ohm
Lead Channel Pacing Threshold Amplitude: 0.5 V
Lead Channel Pacing Threshold Amplitude: 0.75 V
Lead Channel Pacing Threshold Pulse Width: 0.4 ms
Lead Channel Pacing Threshold Pulse Width: 0.4 ms
Lead Channel Sensing Intrinsic Amplitude: 5 mV
Lead Channel Setting Pacing Amplitude: 2 V
Lead Channel Setting Pacing Amplitude: 2 V
Lead Channel Setting Pacing Pulse Width: 0.4 ms
Lead Channel Setting Sensing Sensitivity: 4 mV
Pulse Gen Model: 2272
Pulse Gen Serial Number: 8237607

## 2024-08-01 ENCOUNTER — Ambulatory Visit: Payer: Self-pay | Admitting: Cardiovascular Disease

## 2024-08-01 NOTE — Progress Notes (Signed)
 Remote PPM Transmission

## 2024-08-23 DIAGNOSIS — I10 Essential (primary) hypertension: Secondary | ICD-10-CM | POA: Diagnosis not present

## 2024-08-23 DIAGNOSIS — Z85820 Personal history of malignant melanoma of skin: Secondary | ICD-10-CM | POA: Diagnosis not present

## 2024-08-23 DIAGNOSIS — I480 Paroxysmal atrial fibrillation: Secondary | ICD-10-CM | POA: Diagnosis not present

## 2024-08-23 DIAGNOSIS — Z008 Encounter for other general examination: Secondary | ICD-10-CM | POA: Diagnosis not present

## 2024-08-23 DIAGNOSIS — Z125 Encounter for screening for malignant neoplasm of prostate: Secondary | ICD-10-CM | POA: Diagnosis not present

## 2024-08-23 DIAGNOSIS — I2510 Atherosclerotic heart disease of native coronary artery without angina pectoris: Secondary | ICD-10-CM | POA: Diagnosis not present

## 2024-08-23 DIAGNOSIS — N400 Enlarged prostate without lower urinary tract symptoms: Secondary | ICD-10-CM | POA: Diagnosis not present

## 2024-08-23 DIAGNOSIS — E7800 Pure hypercholesterolemia, unspecified: Secondary | ICD-10-CM | POA: Diagnosis not present

## 2024-08-23 DIAGNOSIS — Z6827 Body mass index (BMI) 27.0-27.9, adult: Secondary | ICD-10-CM | POA: Diagnosis not present

## 2024-08-23 DIAGNOSIS — Z789 Other specified health status: Secondary | ICD-10-CM | POA: Diagnosis not present

## 2024-08-23 DIAGNOSIS — E663 Overweight: Secondary | ICD-10-CM | POA: Diagnosis not present

## 2024-08-23 DIAGNOSIS — R7309 Other abnormal glucose: Secondary | ICD-10-CM | POA: Diagnosis not present

## 2024-09-16 ENCOUNTER — Encounter: Payer: Self-pay | Admitting: Radiology

## 2024-10-16 ENCOUNTER — Ambulatory Visit: Admission: EM | Admit: 2024-10-16 | Discharge: 2024-10-16 | Disposition: A | Discharge status: Home / Self Care

## 2024-10-16 DIAGNOSIS — J0100 Acute maxillary sinusitis, unspecified: Secondary | ICD-10-CM | POA: Diagnosis not present

## 2024-10-16 MED ORDER — AMOXICILLIN-POT CLAVULANATE 875-125 MG PO TABS: 1.0000 | ORAL_TABLET | Freq: Two times a day (BID) | ORAL | 0 refills | Status: DC

## 2024-10-16 NOTE — ED Triage Notes (Signed)
 Patient to Urgent Care with complaints of nasal and head congestion/ cough/ drainage. No fevers.  Symptoms x1 week.  Meds: Mucinex-D with some relief.

## 2024-10-16 NOTE — ED Provider Notes (Signed)
 CAY RALPH PELT    CSN: [id number] Arrival date & time: 10/16/24  1206      History   Chief Complaint Chief Complaint  Patient presents with   Nasal Congestion   Cough    HPI [name] is a 78 y.o. male.  Patient presents with 1 week history of congestion, postnasal drip, cough.  He has been treating his symptoms with Mucinex D.  No fever, shortness of breath, chest pain.  The history is provided by the patient and medical records.    Past Medical History:  Diagnosis Date   Atrophic kidney    left    Cataract    left eye mild    Chicken pox    COVID-19    09/2019/2021 ? month   Degenerative disc disease, lumbar    L4/5 noted imaging   Dyslipidemia    Encounter for care of pacemaker 05/05/2019   Gallstones    GERD (gastroesophageal reflux disease)    History of esophageal stricture    05/08/09   History of kidney stones    Kidney stones    LVH (left ventricular hypertrophy)    mild on imaging    Macular degeneration    Presence of permanent cardiac pacemaker    Solitary kidney    left kidney is atrophic     Patient Active Problem List   Diagnosis Date Noted   Unilateral primary osteoarthritis, right knee 02/07/2024   Hypercoagulable state due to persistent atrial fibrillation (HCC) 01/09/2024   COVID 08/31/2022   Early dry stage nonexudative age-related macular degeneration 05/19/2022   Essential hypertension 05/19/2022   Paroxysmal atrial fibrillation (HCC) 07/23/2021   Age-related cataract of right eye 05/11/2021   Hydronephrosis, left 05/18/2020   Fatty liver 05/15/2020   Granulomatous disease 05/15/2020   Gallstones 05/15/2020   Bursitis of right hip 05/01/2020   Coronary artery disease involving native coronary artery of native heart without angina pectoris 05/01/2020   Encounter for care of pacemaker 05/05/2019   Pacemaker [employer] Jude PM Assurity DR Dual chamber pacemaker implant  02/05/2014 04/01/2019   History of kidney stones 03/26/2019    Malignant melanoma in situ (HCC) 05/01/2018   HLD (hyperlipidemia) 03/23/2018   Multiple nevi 03/23/2018   Overactive bladder 11/16/2017   HTN (hypertension) 10/20/2017   BPH (benign prostatic hyperplasia) 10/20/2017   Impingement syndrome of right shoulder 03/08/2017   Coronary artery calcification 02/07/2014   Solitary kidney 02/05/2014   GERD (gastroesophageal reflux disease) 02/05/2014   Dyslipidemia 02/05/2014   Atrioventricular block, complete (HCC) 02/05/2014    Past Surgical History:  Procedure Laterality Date   ATRIAL FIBRILLATION ABLATION N/A 11/23/2023   Procedure: ATRIAL FIBRILLATION ABLATION;  Surgeon: Nancey Eulas BRAVO, MD;  Location: [facility];  Service: Cardiovascular;  Laterality: N/A;   CARDIOVERSION N/A 10/14/2022   Procedure: CARDIOVERSION;  Surgeon: Ladona Heinz, MD;  Location: [facility];  Service: Cardiovascular;  Laterality: N/A;   CATARACT EXTRACTION W/PHACO Right 08/10/2021   Procedure: CATARACT EXTRACTION PHACO AND INTRAOCULAR LENS PLACEMENT (IOC) RIGHT TORIC LENS 9.17 00:57.9 ;  Surgeon: Jaye Fallow, MD;  Location: [facility];  Service: Ophthalmology;  Laterality: Right;   CATARACT EXTRACTION W/PHACO Left 08/31/2021   Procedure: CATARACT EXTRACTION PHACO AND INTRAOCULAR LENS PLACEMENT (IOC) LEFT 8.45 00:47.8;  Surgeon: Jaye Fallow, MD;  Location: [facility];  Service: Ophthalmology;  Laterality: Left;   FOOT SURGERY     right mortons neuroma    HOLEP-LASER ENUCLEATION OF THE PROSTATE WITH  MORCELLATION N/A 07/10/2020   Procedure: HOLEP-LASER ENUCLEATION OF THE PROSTATE WITH MORCELLATION;  Surgeon: Francisca Redell BROCKS, MD;  Location: [facility]S;  Service: Urology;  Laterality: N/A;   PACEMAKER PLACEMENT     2015 Dr. Fernande for AV block   PERMANENT PACEMAKER INSERTION N/A 02/05/2014   Procedure: PERMANENT PACEMAKER INSERTION;  Surgeon: Elspeth BROCKS Fernande, MD;  Location: [facility] CATH LAB;  Service: Cardiovascular;  Laterality: N/A;   PPM  GENERATOR CHANGEOUT N/A 10/23/2023   Procedure: PPM GENERATOR CHANGEOUT;  Surgeon: Nancey, Eulas BRAVO, MD;  Location: [facility];  Service: Cardiovascular;  Laterality: N/A;       Home Medications    Prior to Admission medications   Medication Sig Start Date End Date Taking? Authorizing Provider  amoxicillin-clavulanate (AUGMENTIN) 875-125 MG tablet Take 1 tablet by mouth every 12 (twelve) hours. 10/16/24  Yes Corlis Burnard DEL, NP  apixaban  (ELIQUIS ) 5 MG TABS tablet TAKE 1 TABLET(5 MG) BY MOUTH TWICE DAILY 07/22/24   Ladona Heinz, MD  Ascorbic Acid (VITAMIN C) 1000 MG tablet Take 1,000 mg by mouth daily.    [provider]  carvedilol  (COREG ) 12.5 MG tablet TAKE 1 TABLET(12.5 MG) BY MOUTH TWICE DAILY WITH A MEAL 05/19/22   McLean-Scocuzza, Randine SAILOR, MD  cholecalciferol (VITAMIN D3) 25 MCG (1000 UNIT) tablet Take 1,000 Units by mouth daily.    [provider]  ezetimibe  (ZETIA ) 10 MG tablet Take 10 mg by mouth daily.    [provider]  Multiple Vitamins-Minerals (PRESERVISION AREDS 2 PO) Take 1 tablet by mouth in the morning and at bedtime.    [provider]  omeprazole  (PRILOSEC) 40 MG capsule TAKE ONE CAPSULE BY MOUTH DAILY, TAKE 30 MINTUES BEFORE BREAKFAST 04/08/22   McLean-Scocuzza, Randine SAILOR, MD  Pseudoeph-Doxylamine-DM-APAP (NYQUIL PO) Take 1 Dose by mouth at bedtime as needed (congestion).    [provider]  Zinc 50 MG CAPS Take 50 mg by mouth daily.     [provider]    Family History Family History  Problem Relation Age of Onset   Diabetes Mother        died of complications    Cancer Father        bladder    Stroke Father     Social History Social History   Tobacco Use   Smoking status: Former    Current packs/day: 0.00    Average packs/day: 1 pack/day for 20.0 years (20.0 ttl pk-yrs)    Types: Cigarettes    Start date: 10/21/1971    Quit date: 10/21/1991    Years since quitting: 33.0   Smokeless tobacco: Never    Tobacco comments:    smoked [id number] <1 ppd no FH lung cancer   Vaping Use   Vaping status: Never Used  Substance Use Topics   Alcohol use: Yes    Alcohol/week: 7.0 standard drinks of alcohol    Types: 7 Standard drinks or equivalent per week    Comment: Drinks 2-3 ounces of liquor daily   Drug use: No     Allergies   Rosuvastatin, Lipitor [atorvastatin], Lisinopril, and Simvastatin   Review of Systems Review of Systems  Constitutional:  Negative for chills and fever.  HENT:  Positive for congestion, postnasal drip and rhinorrhea. Negative for ear pain and sore throat.   Respiratory:  Positive for cough. Negative for shortness of breath.   Cardiovascular:  Negative for chest pain and palpitations.     Physical Exam Triage Vital Signs  ED Triage Vitals  Encounter Vitals Group     BP 10/16/24 1432 129/84     Girls Systolic BP Percentile --      Girls Diastolic BP Percentile --      Boys Systolic BP Percentile --      Boys Diastolic BP Percentile --      Pulse Rate 10/16/24 1432 69     Resp 10/16/24 1432 19     Temp 10/16/24 1432 97.7 F (36.5 C)     Temp src --      SpO2 10/16/24 1432 99 %     Weight --      Height --      Head Circumference --      Peak Flow --      Pain Score 10/16/24 1431 0     Pain Loc --      Pain Education --      Exclude from Growth Chart --    No data found.  Updated Vital Signs BP 129/84   Pulse 69   Temp 97.7 F (36.5 C)   Resp 19   SpO2 99%   Visual Acuity Right Eye Distance:   Left Eye Distance:   Bilateral Distance:    Right Eye Near:   Left Eye Near:    Bilateral Near:     Physical Exam Constitutional:      General: He is not in acute distress. HENT:     Right Ear: Tympanic membrane normal.     Left Ear: Tympanic membrane normal.     Nose: Congestion present.     Mouth/Throat:     Mouth: Mucous membranes are moist.     Pharynx: Oropharynx is clear.  Cardiovascular:     Rate and Rhythm: Normal rate and regular  rhythm.     Heart sounds: Normal heart sounds.  Pulmonary:     Effort: Pulmonary effort is normal. No respiratory distress.     Breath sounds: Normal breath sounds.  Neurological:     Mental Status: He is alert.      UC Treatments / Results  Labs (all labs ordered are listed, but only abnormal results are displayed) Labs Reviewed - No data to display  EKG   Radiology No results found.  Procedures Procedures (including critical care time)  Medications Ordered in UC Medications - No data to display  Initial Impression / Assessment and Plan / UC Course  I have reviewed the triage vital signs and the nursing notes.  Pertinent labs & imaging results that were available during my care of the patient were reviewed by me and considered in my medical decision making (see chart for details).    Acute sinusitis.  Afebrile and vital signs are stable.  Lungs are clear and O2 sat is 99% on room air.  Treating today with Augmentin.  Education provided on sinus infection.  Instructed patient to follow-up with his PCP if he is not improving.  He agrees to plan of care.  Final Clinical Impressions(s) / UC Diagnoses   Final diagnoses:  Acute non-recurrent maxillary sinusitis     Discharge Instructions      Take the Augmentin as directed.  Follow-up with your primary care provider if your symptoms are not improving.      ED Prescriptions     Medication Sig Dispense Auth. Provider   amoxicillin-clavulanate (AUGMENTIN) 875-125 MG tablet Take 1 tablet by mouth every 12 (twelve) hours. 14 tablet Corlis Burnard DEL, NP  PDMP not reviewed this encounter.   Corlis Burnard DEL, NP 10/16/24 [phone]

## 2024-10-16 NOTE — Discharge Instructions (Addendum)
 Take the Augmentin as directed.  Follow up with your primary care provider if your symptoms are not improving.

## 2024-10-22 ENCOUNTER — Ambulatory Visit: Payer: No Typology Code available for payment source

## 2024-10-23 LAB — CUP PACEART REMOTE DEVICE CHECK
Battery Remaining Longevity: 92 mo
Battery Remaining Percentage: 92 %
Battery Voltage: 2.99 V
Brady Statistic AP VP Percent: 97 %
Brady Statistic AP VS Percent: 1 %
Brady Statistic AS VP Percent: 3.2 %
Brady Statistic AS VS Percent: 1 %
Brady Statistic RA Percent Paced: 97 %
Brady Statistic RV Percent Paced: 99 %
Date Time Interrogation Session: 20251209020015
Implantable Lead Connection Status: 753985
Implantable Lead Connection Status: 753985
Implantable Lead Implant Date: 20150325
Implantable Lead Implant Date: 20150325
Implantable Lead Location: 753859
Implantable Lead Location: 753860
Implantable Lead Model: 5076
Implantable Pulse Generator Implant Date: 20241209
Lead Channel Impedance Value: 440 Ohm
Lead Channel Impedance Value: 490 Ohm
Lead Channel Pacing Threshold Amplitude: 0.5 V
Lead Channel Pacing Threshold Amplitude: 0.75 V
Lead Channel Pacing Threshold Pulse Width: 0.4 ms
Lead Channel Pacing Threshold Pulse Width: 0.4 ms
Lead Channel Sensing Intrinsic Amplitude: 4.1 mV
Lead Channel Setting Pacing Amplitude: 2 V
Lead Channel Setting Pacing Amplitude: 2 V
Lead Channel Setting Pacing Pulse Width: 0.4 ms
Lead Channel Setting Sensing Sensitivity: 4 mV
Pulse Gen Model: 2272
Pulse Gen Serial Number: 8237607

## 2024-10-29 NOTE — Progress Notes (Signed)
 Remote PPM Transmission

## 2024-11-01 ENCOUNTER — Ambulatory Visit: Payer: Self-pay | Admitting: Cardiovascular Disease

## 2024-11-19 ENCOUNTER — Telehealth: Payer: Self-pay | Admitting: Cardiology

## 2024-11-19 DIAGNOSIS — I480 Paroxysmal atrial fibrillation: Secondary | ICD-10-CM

## 2024-11-19 NOTE — Telephone Encounter (Signed)
" °*  STAT* If patient is at the pharmacy, call can be transferred to refill team.   1. Which medications need to be refilled? (please list name of each medication and dose if known)  apixaban  (ELIQUIS ) 5 MG TABS tablet   2. Would you like to learn more about the convenience, safety, & potential cost savings by using the [facility]? no   3. Are you open to using the [facility] (Type [facility]. no   4. Which pharmacy/location (including street and city if local pharmacy) is medication to be sent to? [id number] - [place], [place] - [address] OF [address]   5. Do they need a 30 day or 90 day supply? 90 day      "

## 2024-11-20 MED ORDER — APIXABAN 5 MG PO TABS: 5.0000 mg | ORAL_TABLET | Freq: Two times a day (BID) | ORAL | 1 refills | Status: AC

## 2024-11-20 NOTE — Telephone Encounter (Signed)
 Eliquis  5mg  refill request received. Patient is 79 years old, weight-96.8kg, Crea-0.80 on 08/23/24 via [facility] from [place], [place], and last seen by Radonna Arthur on 02/22/24 and pending appt with Tessa on 12/04/24. Dose is appropriate based on dosing criteria. Will send in refill to requested pharmacy.

## 2024-12-04 ENCOUNTER — Encounter: Payer: Self-pay | Admitting: Physician Assistant

## 2024-12-04 ENCOUNTER — Ambulatory Visit: Attending: Physician Assistant | Admitting: Physician Assistant

## 2024-12-04 VITALS — BP 102/80 | HR 71 | Ht 73.0 in | Wt 217.0 lb

## 2024-12-04 DIAGNOSIS — D6869 Other thrombophilia: Secondary | ICD-10-CM

## 2024-12-04 DIAGNOSIS — I4819 Other persistent atrial fibrillation: Secondary | ICD-10-CM

## 2024-12-04 DIAGNOSIS — I10 Essential (primary) hypertension: Secondary | ICD-10-CM | POA: Diagnosis not present

## 2024-12-04 DIAGNOSIS — I480 Paroxysmal atrial fibrillation: Secondary | ICD-10-CM | POA: Diagnosis not present

## 2024-12-04 DIAGNOSIS — Z950 Presence of cardiac pacemaker: Secondary | ICD-10-CM | POA: Diagnosis not present

## 2024-12-04 DIAGNOSIS — E782 Mixed hyperlipidemia: Secondary | ICD-10-CM | POA: Diagnosis not present

## 2024-12-04 DIAGNOSIS — I2510 Atherosclerotic heart disease of native coronary artery without angina pectoris: Secondary | ICD-10-CM | POA: Diagnosis not present

## 2024-12-04 NOTE — Progress Notes (Signed)
 " Cardiology Office Note   Date:  12/04/2024  ID:  [name], DOB 1946/03/03, MRN [id number] PCP: Sadie Manna, MD  [facility] Providers Cardiologist:  Gordy Bergamo, MD Electrophysiologist:  Eulas FORBES Furbish, MD   History of Present Illness [name] is a 79 y.o. male with a hx of 1 week history of congestion, postnasal drip, cough.  He has been treating his symptoms with Mucinex D.  No fever, shortness of breath, chest pain.   [name] is a 79 year old male with atrial fibrillation who presents for a cardiovascular follow-up.  He underwent an ablation and reports he has remained in normal rhythm with no new cardiac symptoms. He takes Eliquis  5 mg twice daily and carvedilol  12.5 mg twice daily without dizziness or lightheadedness.  An EKG in April of the previous year showed a heart rate of 70 beats per minute, unchanged from prior tracings. He does have a pacemaker, EKG from April reviewed.   Labs from October showed LDL 156 mg/dL. He began ezetimibe  10 mg daily at that time due to statin intolerance. He does not want to repeat a lipid panel at this time.   He walks up to 10,000 steps on workdays and golfs. He estimates about half that activity on most other days.   Reports no shortness of breath nor dyspnea on exertion. Reports no chest pain, pressure, or tightness. No edema, orthopnea, PND. Reports no palpitations.   Discussed the use of AI scribe software for clinical note transcription with the patient, who gave verbal consent to proceed.   ROS: pertinent ROS in HPI  Studies Reviewed     CT cardiac 10/2023  IMPRESSION: 1. There is normal pulmonary vein drainage into the left atrium. Measurements as reported   2. There is no thrombus in the left atrial appendage.   3. The esophagus courses posterior to ostium of left lower pulmonary vein   4. Coronary calcium score 568 (65th percentile)   Risk Assessment/Calculations  CHA2DS2-VASc Score = 4   This indicates a 4.8% annual risk of stroke. The patient's score is based upon: CHF History: 0 HTN History: 1 Diabetes History: 0 Stroke History: 0 Vascular Disease History: 1 Age Score: 2 Gender Score: 0      Physical Exam VS:  BP 102/80   Pulse 71   Ht 6' 1 (1.854 m)   Wt 217 lb (98.4 kg)   SpO2 98%   BMI 28.63 kg/m        Wt Readings from Last 3 Encounters:  12/04/24 217 lb (98.4 kg)  02/22/24 213 lb 6.4 oz (96.8 kg)  02/07/24 212 lb (96.2 kg)    GEN: Well nourished, well developed in no acute distress NECK: No JVD; No carotid bruits CARDIAC: RRR, no murmurs, rubs, gallops RESPIRATORY:  Clear to auscultation without rales, wheezing or rhonchi  ABDOMEN: Soft, non-tender, non-distended EXTREMITIES:  No edema; No deformity   ASSESSMENT AND PLAN Atrial fibrillation, status post ablation, on anticoagulation Atrial fibrillation well-controlled post-ablation. Heart rate 71 bpm, blood pressure low normal. No issues with Eliquis  and carvedilol . - Continue Eliquis  5 mg twice daily. - Continue carvedilol  12.5 mg twice daily.  Hyperlipidemia, on ezetimibe  LDL cholesterol 156 mg/dL, above target. Ezetimibe  alone may not suffice. Discussed non-statin options; he prefers to wait six months before rechecking. - Ordered lipid panel in six months. - Continue ezetimibe  10 mg daily.     Dispo: He can see Dr. Bergamo in 11 months  Signed, Orren LOISE Fabry, PA-C   "

## 2024-12-04 NOTE — Patient Instructions (Signed)
 Medication Instructions:  Your physician recommends that you continue on your current medications as directed. Please refer to the Current Medication list given to you today. *If you need a refill on your cardiac medications before your next appointment, please call your pharmacy*  Lab Work:  If you have labs (blood work) drawn today and your tests are completely normal, you will receive your results only by: MyChart Message (if you have MyChart) OR A paper copy in the mail If you have any lab test that is abnormal or we need to change your treatment, we will call you to review the results.  Testing/Procedures: None ordered  Follow-Up: At [facility], you and your health needs are our priority.  As part of our continuing mission to provide you with exceptional heart care, our providers are all part of one team.  This team includes your primary Cardiologist (physician) and Advanced Practice Providers or APPs (Physician Assistants and Nurse Practitioners) who all work together to provide you with the care you need, when you need it.  Your next appointment:   11 month(s)  Provider:   Gordy Bergamo, MD    We recommend signing up for the patient portal called MyChart.  Sign up information is provided on this After Visit Summary.  MyChart is used to connect with patients for Virtual Visits (Telemedicine).  Patients are able to view lab/test results, encounter notes, upcoming appointments, etc.  Non-urgent messages can be sent to your provider as well.   To learn more about what you can do with MyChart, go to [email or web address].   Other Instructions

## 2024-12-04 NOTE — Addendum Note (Signed)
 Addended by: SEBASTIAN, TEE Y on: 12/04/2024 04:02 PM   Modules accepted: Orders

## 2024-12-10 ENCOUNTER — Telehealth: Payer: Self-pay | Admitting: Physician Assistant

## 2024-12-10 ENCOUNTER — Telehealth: Payer: Self-pay | Admitting: Cardiology

## 2024-12-10 NOTE — Telephone Encounter (Signed)
 Paper Work Dropped Off:   Date: 12/10/2024  Location of paper:  Provider Mailbox

## 2024-12-12 NOTE — Telephone Encounter (Signed)
 Patient has been notified and voiced understanding.

## 2024-12-13 LAB — LIPID PANEL
Chol/HDL Ratio: 4.5 ratio (ref 0.0–5.0)
Cholesterol, Total: 211 mg/dL — ABNORMAL HIGH (ref 100–199)
HDL: 47 mg/dL
LDL Chol Calc (NIH): 139 mg/dL — ABNORMAL HIGH (ref 0–99)
Triglycerides: 140 mg/dL (ref 0–149)
VLDL Cholesterol Cal: 25 mg/dL (ref 5–40)

## 2024-12-13 LAB — HEPATIC FUNCTION PANEL
ALT: 20 [IU]/L (ref 0–44)
AST: 21 [IU]/L (ref 0–40)
Albumin: 4.3 g/dL (ref 3.8–4.8)
Alkaline Phosphatase: 81 [IU]/L (ref 47–123)
Bilirubin Total: 0.8 mg/dL (ref 0.0–1.2)
Bilirubin, Direct: 0.22 mg/dL (ref 0.00–0.40)
Total Protein: 6.5 g/dL (ref 6.0–8.5)

## 2024-12-16 ENCOUNTER — Ambulatory Visit: Payer: Self-pay | Admitting: Physician Assistant

## 2025-01-29 ENCOUNTER — Ambulatory Visit: Admitting: Cardiovascular Disease
